# Patient Record
Sex: Female | Born: 1960 | Race: Black or African American | Hispanic: No | Marital: Single | Smoking: Never smoker
Health system: Southern US, Community
[De-identification: ages and names within clinical notes are randomized; demographics above are authoritative.]

## PROBLEM LIST (undated history)

## (undated) DIAGNOSIS — R079 Chest pain, unspecified: Secondary | ICD-10-CM

## (undated) DIAGNOSIS — R51 Headache: Secondary | ICD-10-CM

## (undated) DIAGNOSIS — E785 Hyperlipidemia, unspecified: Secondary | ICD-10-CM

## (undated) DIAGNOSIS — R112 Nausea with vomiting, unspecified: Secondary | ICD-10-CM

## (undated) DIAGNOSIS — I10 Essential (primary) hypertension: Secondary | ICD-10-CM

## (undated) DIAGNOSIS — K219 Gastro-esophageal reflux disease without esophagitis: Secondary | ICD-10-CM

## (undated) DIAGNOSIS — M797 Fibromyalgia: Secondary | ICD-10-CM

## (undated) DIAGNOSIS — F419 Anxiety disorder, unspecified: Secondary | ICD-10-CM

## (undated) DIAGNOSIS — R05 Cough: Secondary | ICD-10-CM

## (undated) DIAGNOSIS — Z9889 Other specified postprocedural states: Secondary | ICD-10-CM

## (undated) DIAGNOSIS — F32A Depression, unspecified: Secondary | ICD-10-CM

## (undated) DIAGNOSIS — R059 Cough, unspecified: Secondary | ICD-10-CM

## (undated) DIAGNOSIS — F329 Major depressive disorder, single episode, unspecified: Secondary | ICD-10-CM

## (undated) DIAGNOSIS — Z8679 Personal history of other diseases of the circulatory system: Secondary | ICD-10-CM

## (undated) HISTORY — DX: Chest pain, unspecified: R07.9

## (undated) HISTORY — DX: Personal history of other diseases of the circulatory system: Z86.79

## (undated) HISTORY — DX: Cough: R05

## (undated) HISTORY — DX: Hyperlipidemia, unspecified: E78.5

## (undated) HISTORY — PX: BLADDER SUSPENSION: SHX72

## (undated) HISTORY — PX: HEMATOMA EVACUATION: SHX5118

## (undated) HISTORY — DX: Essential (primary) hypertension: I10

## (undated) HISTORY — PX: UTERINE SUSPENSION: SUR1430

## (undated) HISTORY — PX: OTHER SURGICAL HISTORY: SHX169

## (undated) HISTORY — DX: Headache: R51

## (undated) HISTORY — DX: Cough, unspecified: R05.9

---

## 1997-09-10 ENCOUNTER — Other Ambulatory Visit: Admission: RE | Admit: 1997-09-10 | Discharge: 1997-09-10 | Payer: Self-pay | Admitting: Obstetrics and Gynecology

## 1997-11-26 ENCOUNTER — Other Ambulatory Visit: Admission: RE | Admit: 1997-11-26 | Discharge: 1997-11-26 | Payer: Self-pay | Admitting: Obstetrics and Gynecology

## 1997-12-11 ENCOUNTER — Ambulatory Visit (HOSPITAL_COMMUNITY): Admission: RE | Admit: 1997-12-11 | Discharge: 1997-12-11 | Payer: Self-pay | Admitting: Obstetrics & Gynecology

## 1998-03-17 ENCOUNTER — Emergency Department (HOSPITAL_COMMUNITY): Admission: EM | Admit: 1998-03-17 | Discharge: 1998-03-17 | Payer: Self-pay | Admitting: Emergency Medicine

## 1998-04-12 ENCOUNTER — Inpatient Hospital Stay (HOSPITAL_COMMUNITY): Admission: AD | Admit: 1998-04-12 | Discharge: 1998-04-17 | Payer: Self-pay | Admitting: Obstetrics and Gynecology

## 1998-12-13 ENCOUNTER — Ambulatory Visit (HOSPITAL_COMMUNITY): Admission: RE | Admit: 1998-12-13 | Discharge: 1998-12-13 | Payer: Self-pay | Admitting: Urology

## 1999-05-09 ENCOUNTER — Inpatient Hospital Stay (HOSPITAL_COMMUNITY): Admission: RE | Admit: 1999-05-09 | Discharge: 1999-05-11 | Payer: Self-pay | Admitting: Urology

## 1999-10-04 ENCOUNTER — Other Ambulatory Visit: Admission: RE | Admit: 1999-10-04 | Discharge: 1999-10-04 | Payer: Self-pay | Admitting: Obstetrics and Gynecology

## 2000-04-19 ENCOUNTER — Emergency Department (HOSPITAL_COMMUNITY): Admission: EM | Admit: 2000-04-19 | Discharge: 2000-04-19 | Payer: Self-pay | Admitting: Emergency Medicine

## 2000-09-14 ENCOUNTER — Other Ambulatory Visit: Admission: RE | Admit: 2000-09-14 | Discharge: 2000-09-14 | Payer: Self-pay | Admitting: Obstetrics and Gynecology

## 2001-09-16 ENCOUNTER — Other Ambulatory Visit: Admission: RE | Admit: 2001-09-16 | Discharge: 2001-09-16 | Payer: Self-pay | Admitting: Obstetrics and Gynecology

## 2001-10-01 ENCOUNTER — Other Ambulatory Visit: Admission: RE | Admit: 2001-10-01 | Discharge: 2001-10-01 | Payer: Self-pay | Admitting: Obstetrics and Gynecology

## 2002-04-10 ENCOUNTER — Other Ambulatory Visit: Admission: RE | Admit: 2002-04-10 | Discharge: 2002-04-10 | Payer: Self-pay | Admitting: Obstetrics and Gynecology

## 2002-05-17 ENCOUNTER — Inpatient Hospital Stay (HOSPITAL_COMMUNITY): Admission: AD | Admit: 2002-05-17 | Discharge: 2002-05-19 | Payer: Self-pay | Admitting: Obstetrics and Gynecology

## 2002-05-23 ENCOUNTER — Inpatient Hospital Stay (HOSPITAL_COMMUNITY): Admission: RE | Admit: 2002-05-23 | Discharge: 2002-05-25 | Payer: Self-pay | Admitting: Obstetrics and Gynecology

## 2003-04-21 ENCOUNTER — Other Ambulatory Visit: Admission: RE | Admit: 2003-04-21 | Discharge: 2003-04-21 | Payer: Self-pay | Admitting: Obstetrics and Gynecology

## 2004-05-22 HISTORY — PX: CARDIAC CATHETERIZATION: SHX172

## 2004-07-18 ENCOUNTER — Other Ambulatory Visit: Admission: RE | Admit: 2004-07-18 | Discharge: 2004-07-18 | Payer: Self-pay | Admitting: Obstetrics and Gynecology

## 2004-07-27 ENCOUNTER — Ambulatory Visit: Payer: Self-pay | Admitting: Cardiology

## 2004-08-10 ENCOUNTER — Ambulatory Visit: Payer: Self-pay | Admitting: Cardiology

## 2004-08-15 ENCOUNTER — Inpatient Hospital Stay (HOSPITAL_BASED_OUTPATIENT_CLINIC_OR_DEPARTMENT_OTHER): Admission: RE | Admit: 2004-08-15 | Discharge: 2004-08-15 | Payer: Self-pay | Admitting: Cardiology

## 2004-08-15 ENCOUNTER — Ambulatory Visit: Payer: Self-pay | Admitting: Cardiology

## 2004-09-08 ENCOUNTER — Ambulatory Visit: Payer: Self-pay | Admitting: Cardiology

## 2005-08-03 ENCOUNTER — Other Ambulatory Visit: Admission: RE | Admit: 2005-08-03 | Discharge: 2005-08-03 | Payer: Self-pay | Admitting: Obstetrics and Gynecology

## 2005-08-23 ENCOUNTER — Ambulatory Visit: Payer: Self-pay | Admitting: Cardiology

## 2005-10-18 ENCOUNTER — Ambulatory Visit: Payer: Self-pay | Admitting: Cardiology

## 2005-12-15 ENCOUNTER — Ambulatory Visit: Payer: Self-pay | Admitting: Cardiology

## 2006-07-02 ENCOUNTER — Emergency Department (HOSPITAL_COMMUNITY): Admission: EM | Admit: 2006-07-02 | Discharge: 2006-07-02 | Payer: Self-pay | Admitting: Family Medicine

## 2006-08-06 ENCOUNTER — Ambulatory Visit: Payer: Self-pay | Admitting: Cardiology

## 2006-08-06 LAB — CONVERTED CEMR LAB
ALT: 10 units/L (ref 0–40)
Alkaline Phosphatase: 103 units/L (ref 39–117)
Bilirubin, Direct: 0.1 mg/dL (ref 0.0–0.3)
Total CHOL/HDL Ratio: 5
VLDL: 21 mg/dL (ref 0–40)

## 2006-08-23 ENCOUNTER — Ambulatory Visit: Payer: Self-pay | Admitting: Cardiology

## 2006-10-02 ENCOUNTER — Ambulatory Visit: Payer: Self-pay | Admitting: Cardiology

## 2006-10-02 LAB — CONVERTED CEMR LAB
AST: 23 units/L (ref 0–37)
Bilirubin, Direct: 0.1 mg/dL (ref 0.0–0.3)
HDL: 46.8 mg/dL (ref >39.0)
Triglycerides: 54 mg/dL (ref 0–149)
VLDL: 11 mg/dL (ref 0–40)

## 2007-08-22 ENCOUNTER — Ambulatory Visit: Payer: Self-pay | Admitting: Cardiology

## 2007-08-22 LAB — CONVERTED CEMR LAB
ALT: 11 units/L (ref 0–35)
AST: 18 units/L (ref 0–37)
Albumin: 3.8 g/dL (ref 3.5–5.2)
Alkaline Phosphatase: 100 units/L (ref 39–117)
BUN: 11 mg/dL (ref 6–23)
CO2: 31 meq/L (ref 19–32)
Chloride: 102 meq/L (ref 96–112)
Cholesterol: 191 mg/dL (ref 0–200)
GFR calc Af Amer: 86 mL/min
Glucose, Bld: 92 mg/dL (ref 70–99)
Potassium: 4.2 meq/L (ref 3.5–5.1)
Total Protein: 7.7 g/dL (ref 6.0–8.3)
VLDL: 13 mg/dL (ref 0–40)

## 2007-11-27 ENCOUNTER — Ambulatory Visit: Payer: Self-pay | Admitting: Cardiology

## 2007-11-27 LAB — CONVERTED CEMR LAB
ALT: 15 units/L (ref 0–35)
AST: 21 units/L (ref 0–37)
Alkaline Phosphatase: 103 units/L (ref 39–117)
Bilirubin, Direct: 0.1 mg/dL (ref 0.0–0.3)
Total Bilirubin: 0.8 mg/dL (ref 0.3–1.2)

## 2008-03-10 ENCOUNTER — Ambulatory Visit: Payer: Self-pay | Admitting: Family Medicine

## 2008-04-10 ENCOUNTER — Ambulatory Visit: Payer: Self-pay | Admitting: Family Medicine

## 2008-09-08 ENCOUNTER — Ambulatory Visit: Payer: Self-pay | Admitting: Family Medicine

## 2008-11-02 DIAGNOSIS — E785 Hyperlipidemia, unspecified: Secondary | ICD-10-CM | POA: Insufficient documentation

## 2008-11-02 DIAGNOSIS — R079 Chest pain, unspecified: Secondary | ICD-10-CM | POA: Insufficient documentation

## 2008-11-02 DIAGNOSIS — Z8679 Personal history of other diseases of the circulatory system: Secondary | ICD-10-CM | POA: Insufficient documentation

## 2008-11-02 DIAGNOSIS — I10 Essential (primary) hypertension: Secondary | ICD-10-CM | POA: Insufficient documentation

## 2008-11-05 ENCOUNTER — Ambulatory Visit: Payer: Self-pay | Admitting: Cardiology

## 2008-11-05 DIAGNOSIS — R059 Cough, unspecified: Secondary | ICD-10-CM | POA: Insufficient documentation

## 2008-11-05 DIAGNOSIS — R05 Cough: Secondary | ICD-10-CM

## 2008-11-20 ENCOUNTER — Ambulatory Visit: Payer: Self-pay | Admitting: Pulmonary Disease

## 2008-11-20 DIAGNOSIS — R519 Headache, unspecified: Secondary | ICD-10-CM | POA: Insufficient documentation

## 2008-11-20 DIAGNOSIS — R51 Headache: Secondary | ICD-10-CM

## 2008-11-25 ENCOUNTER — Telehealth: Payer: Self-pay | Admitting: Pulmonary Disease

## 2008-12-17 ENCOUNTER — Ambulatory Visit: Payer: Self-pay | Admitting: Cardiology

## 2008-12-22 LAB — CONVERTED CEMR LAB
Albumin: 3.8 g/dL (ref 3.5–5.2)
BUN: 13 mg/dL (ref 6–23)
CO2: 30 meq/L (ref 19–32)
Calcium: 9.3 mg/dL (ref 8.4–10.5)
Cholesterol: 231 mg/dL — ABNORMAL HIGH (ref 0–200)
GFR calc non Af Amer: 85.68 mL/min (ref >60)
Glucose, Bld: 94 mg/dL (ref 70–99)
HDL: 47.9 mg/dL (ref >39.00)
Total Protein: 7.7 g/dL (ref 6.0–8.3)
VLDL: 15.2 mg/dL (ref 0.0–40.0)

## 2008-12-30 ENCOUNTER — Telehealth: Payer: Self-pay | Admitting: Cardiology

## 2009-10-21 ENCOUNTER — Ambulatory Visit: Payer: Self-pay | Admitting: Cardiology

## 2009-10-29 ENCOUNTER — Ambulatory Visit: Payer: Self-pay | Admitting: Family Medicine

## 2009-12-13 ENCOUNTER — Ambulatory Visit: Payer: Self-pay | Admitting: Cardiology

## 2009-12-21 ENCOUNTER — Encounter: Payer: Self-pay | Admitting: Cardiology

## 2009-12-21 LAB — CONVERTED CEMR LAB
Albumin: 4.1 g/dL (ref 3.5–5.2)
Alkaline Phosphatase: 89 units/L (ref 39–117)
Bilirubin, Direct: 0.1 mg/dL (ref 0.0–0.3)
LDL Cholesterol: 74 mg/dL (ref 0–99)
Total CHOL/HDL Ratio: 3
Total Protein: 7.4 g/dL (ref 6.0–8.3)
Triglycerides: 49 mg/dL (ref 0.0–149.0)
VLDL: 9.8 mg/dL (ref 0.0–40.0)

## 2010-04-20 ENCOUNTER — Ambulatory Visit: Payer: Self-pay | Admitting: Family Medicine

## 2010-05-05 ENCOUNTER — Ambulatory Visit: Payer: Self-pay | Admitting: Family Medicine

## 2010-06-21 ENCOUNTER — Other Ambulatory Visit (HOSPITAL_COMMUNITY): Payer: Self-pay | Admitting: Obstetrics and Gynecology

## 2010-06-21 ENCOUNTER — Ambulatory Visit (HOSPITAL_COMMUNITY)
Admission: RE | Admit: 2010-06-21 | Discharge: 2010-06-21 | Payer: Self-pay | Source: Home / Self Care | Attending: Orthopedic Surgery | Admitting: Orthopedic Surgery

## 2010-06-21 ENCOUNTER — Emergency Department (HOSPITAL_COMMUNITY)
Admission: EM | Admit: 2010-06-21 | Discharge: 2010-06-21 | Payer: Self-pay | Source: Home / Self Care | Admitting: Family Medicine

## 2010-06-21 DIAGNOSIS — G8929 Other chronic pain: Secondary | ICD-10-CM

## 2010-06-21 NOTE — Letter (Signed)
Summary: Custom - Lipid  [facility], Main Office  [address]   [place], [place] [address]   Phone: [phone]  Fax: [phone]     December 21, 2009 MRN: [id number]   [facility] [address] [place], [place]  [address]   Dear Gina Espinoza,  We have reviewed your cholesterol results.  They are as follows:     Total Cholesterol:    130 (Desirable: less than 200)       HDL  Cholesterol:     46.20  (Desirable: greater than 40 for men and 50 for women)       LDL Cholesterol:       74  (Desirable: less than 100 for low risk and less than 70 for moderate to high risk)       Triglycerides:       49.0  (Desirable: less than 150)  Our recommendations include:EXCELLANT NO CHANGES .   Call our office at the number listed above if you have any questions.  Lowering your LDL cholesterol is important, but it is only one of a large number of "risk factors" that may indicate that you are at risk for heart disease, stroke or other complications of hardening of the arteries.  Other risk factors include:   A.  Cigarette Smoking* B.  High Blood Pressure* C.  Obesity* D.   Low HDL Cholesterol (see yours above)* E.   Diabetes Mellitus (higher risk if your is uncontrolled) F.  Family history of premature heart disease G.  Previous history of stroke or cardiovascular disease    *These are risk factors YOU HAVE CONTROL OVER.  For more information, visit .  There is now evidence that lowering the TOTAL CHOLESTEROL AND LDL CHOLESTEROL can reduce the risk of heart disease.  The American Heart Association recommends the following guidelines for the treatment of elevated cholesterol:  1.  If there is now current heart disease and less than two risk factors, TOTAL CHOLESTEROL should be less than 200 and LDL CHOLESTEROL should be less than 100. 2.  If there is current heart disease or two or more risk factors, TOTAL CHOLESTEROL should be less than 200 and LDL CHOLESTEROL should be  less than 70.  A diet low in cholesterol, saturated fat, and calories is the cornerstone of treatment for elevated cholesterol.  Cessation of smoking and exercise are also important in the management of elevated cholesterol and preventing vascular disease.  Studies have shown that 30 to 60 minutes of physical activity most days can help lower blood pressure, lower cholesterol, and keep your weight at a healthy level.  Drug therapy is used when cholesterol levels do not respond to therapeutic lifestyle changes (smoking cessation, diet, and exercise) and remains unacceptably high.  If medication is started, it is important to have you levels checked periodically to evaluate the need for further treatment options.  Thank you,    [employer] Team
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
## 2010-07-04 ENCOUNTER — Ambulatory Visit (HOSPITAL_COMMUNITY)
Admission: RE | Admit: 2010-07-04 | Discharge: 2010-07-04 | Disposition: A | Discharge status: Home / Self Care | Payer: Self-pay | Source: Ambulatory Visit | Attending: Obstetrics and Gynecology | Admitting: Obstetrics and Gynecology

## 2010-07-04 DIAGNOSIS — M19019 Primary osteoarthritis, unspecified shoulder: Secondary | ICD-10-CM | POA: Insufficient documentation

## 2010-07-04 DIAGNOSIS — G8929 Other chronic pain: Secondary | ICD-10-CM

## 2010-07-04 DIAGNOSIS — M719 Bursopathy, unspecified: Secondary | ICD-10-CM | POA: Insufficient documentation

## 2010-07-04 DIAGNOSIS — M67919 Unspecified disorder of synovium and tendon, unspecified shoulder: Secondary | ICD-10-CM | POA: Insufficient documentation

## 2010-07-04 DIAGNOSIS — M25519 Pain in unspecified shoulder: Secondary | ICD-10-CM | POA: Insufficient documentation

## 2010-08-29 ENCOUNTER — Inpatient Hospital Stay (INDEPENDENT_AMBULATORY_CARE_PROVIDER_SITE_OTHER)
Admission: RE | Admit: 2010-08-29 | Discharge: 2010-08-29 | Disposition: A | Discharge status: Home / Self Care | Payer: Self-pay | Source: Ambulatory Visit | Attending: Family Medicine | Admitting: Family Medicine

## 2010-08-29 DIAGNOSIS — H109 Unspecified conjunctivitis: Secondary | ICD-10-CM

## 2010-08-31 ENCOUNTER — Ambulatory Visit: Payer: Self-pay | Attending: Rheumatology

## 2010-08-31 DIAGNOSIS — M25569 Pain in unspecified knee: Secondary | ICD-10-CM | POA: Insufficient documentation

## 2010-08-31 DIAGNOSIS — M542 Cervicalgia: Secondary | ICD-10-CM | POA: Insufficient documentation

## 2010-08-31 DIAGNOSIS — R5381 Other malaise: Secondary | ICD-10-CM | POA: Insufficient documentation

## 2010-08-31 DIAGNOSIS — M2560 Stiffness of unspecified joint, not elsewhere classified: Secondary | ICD-10-CM | POA: Insufficient documentation

## 2010-08-31 DIAGNOSIS — IMO0001 Reserved for inherently not codable concepts without codable children: Secondary | ICD-10-CM | POA: Insufficient documentation

## 2010-09-07 ENCOUNTER — Ambulatory Visit: Payer: Self-pay

## 2010-09-13 ENCOUNTER — Ambulatory Visit: Payer: Self-pay

## 2010-09-14 ENCOUNTER — Ambulatory Visit: Payer: Self-pay

## 2010-09-19 ENCOUNTER — Ambulatory Visit: Payer: Self-pay

## 2010-09-21 ENCOUNTER — Ambulatory Visit: Payer: Self-pay | Attending: Rheumatology

## 2010-09-21 DIAGNOSIS — M25569 Pain in unspecified knee: Secondary | ICD-10-CM | POA: Insufficient documentation

## 2010-09-21 DIAGNOSIS — M542 Cervicalgia: Secondary | ICD-10-CM | POA: Insufficient documentation

## 2010-09-21 DIAGNOSIS — M2560 Stiffness of unspecified joint, not elsewhere classified: Secondary | ICD-10-CM | POA: Insufficient documentation

## 2010-09-21 DIAGNOSIS — IMO0001 Reserved for inherently not codable concepts without codable children: Secondary | ICD-10-CM | POA: Insufficient documentation

## 2010-09-21 DIAGNOSIS — R5381 Other malaise: Secondary | ICD-10-CM | POA: Insufficient documentation

## 2010-09-23 ENCOUNTER — Telehealth: Payer: Self-pay | Admitting: Cardiology

## 2010-09-23 NOTE — Telephone Encounter (Signed)
LOV,12 faxed to Dr.John C Lalonde's Office @ [phone] 09/23/10/km

## 2010-09-26 ENCOUNTER — Ambulatory Visit: Payer: Self-pay

## 2010-09-26 ENCOUNTER — Telehealth: Payer: Self-pay | Admitting: Cardiology

## 2010-09-26 NOTE — Telephone Encounter (Signed)
Pt Signed ROI, Mailed Records  To Home Address 09/26/10/km

## 2010-09-28 ENCOUNTER — Ambulatory Visit: Payer: Self-pay

## 2010-10-03 ENCOUNTER — Ambulatory Visit: Payer: Self-pay

## 2010-10-04 NOTE — Assessment & Plan Note (Signed)
[facility]                            CARDIOLOGY OFFICE NOTE   NAME:Espinoza, Gina APODACA                       MRN:          [id number]  DATE:08/22/2007                            DOB:          12-29-1960    Gina Espinoza returns today for further management of the following issues.  1. History of chest pain, typical for coronary spasm.  2. Heart catheterization with no obstructive coronary disease.  3. Normal left ventricular function.  4. Mixed hyperlipidemia.  5. Hypertension.   Other than some shoulder pain from over vacuuming and pulling a muscle,  she is doing well.  She denies any angina or anginal equivalents. She  has not had to use any nitroglycerin.   MEDICATIONS:  1. Norvasc 2.5 mg a day.  2. Simvastatin 40 mg a day.  3. Nitroglycerin p.r.n.   Her blood pressure today is 115/70, her pulse is 54 and regular.  Her  weight is 169.  HEENT:  Normocephalic, atraumatic.  PERRLA.  Extraocular movement is  intact.  Sclerae are clear.  Facial symmetry normal.  Carotid upstrokes  are equal bilaterally without bruits. No JVD.  Thyroid is not enlarged.  Trachea is midline.  LUNGS:  Clear.  HEART:  Reveals a nondisplaced PMI.  Normal S1 and S2 without gallop.  ABDOMEN:  Soft with good bowel sounds.  EXTREMITIES:  No cyanosis, clubbing or edema. Pulses are intact.  NEUROLOGIC:  Intact.   EKG:  Normal except for some marked sinus bradycardia.   ASSESSMENT/PLAN:  Gina Espinoza is doing well.  She is totally asymptomatic.  Blood pressure is under good control. She is due lipids and LFTs today.  We will see her back again in a year.     Thomas C. Daleen Squibb, MD, [facility]  Electronically Signed    TCW/MedQ  DD: 08/22/2007  DT: 08/22/2007  Job #: [id number]   cc:   Marcelino Duster L. Vincente Poli, M.D.

## 2010-10-05 ENCOUNTER — Telehealth: Payer: Self-pay | Admitting: Cardiology

## 2010-10-05 NOTE — Telephone Encounter (Signed)
Patient came to office today wondering if she correctly completed the ROI on 09/23/10, because she didn't receive all of the records she was expecting. After authorizing with Luster Landsberg, I printed out a copy of the ROI and put it on the courier for [facility] to complete. Patient was informed it would be forwarded to [facility] for completion and could take 7 to 10 business days.

## 2010-10-07 NOTE — Op Note (Signed)
   NAMEBAILEY, FAIELLA                          ACCOUNT NO.:  [id number]   MEDICAL RECORD NO.:  [id number]                   PATIENT TYPE:  OBV   LOCATION:  9199                                 [facility]ILITY:  [facility]   PHYSICIAN:  Michelle L. Vincente Poli, M.D.            DATE OF BIRTH:  Sep 19, 1960   DATE OF PROCEDURE:  05/16/2002  DATE OF DISCHARGE:                                 OPERATIVE REPORT   PREOPERATIVE DIAGNOSIS:  Symptomatic rectocele.   POSTOPERATIVE DIAGNOSIS:  Symptomatic rectocele.   PROCEDURE:  Posterior repair, anterior perineoplasty.   SURGEON:  Michelle L. Vincente Poli, M.D.   ANESTHESIA:  General.   ESTIMATED BLOOD LOSS:  50 cc.   DESCRIPTION OF PROCEDURE:  The patient was taken to the operating room.  She  was then intubated without difficulty and placed in the lithotomy position.  Exam under anesthesia revealed a grade 2 cystocele, grade 2-3 rectocele and  grade 2 uterine prolapse.  Of note, the patient had been preoperatively  counseled in the office that she would need an anterior repair and a vaginal  hysterectomy but she only wanted to have the posterior repair.  Allis clamps  were placed at 5 and 7 o'clock.  A V-shaped incision was made on the  peroneum and the overlying vaginal epithelium was excised up to the cervix.  The rectovaginal fascia was dissected free and the rectovaginal fascia was  then reapproximated in the midline with interrupted 0 Vicryl.  This was done  distally to proximally.  The redundant vaginal tissue was then excised.  The  perineum was reinforced using 0 Vicryl figure-of-eight suture and the  vaginal epithelium and the perineum were closed using 2-0 Vicryl in a  continuous running locked stitch.  Vaginal packing was placed in the vagina  for hemostasis.  All sponge, lap and instrument counts were correct x 2.  The patient tolerated the procedure well and went to the recovery room in  stable condition.           Michelle L. Vincente Poli, M.D.    [name]  D:  05/16/2002  T:  05/16/2002  Job:  [id number]

## 2010-10-07 NOTE — H&P (Signed)
   Gina Espinoza, Gina Espinoza                          ACCOUNT NO.:  [id number]   MEDICAL RECORD NO.:  [id number]                   PATIENT TYPE:  INP   LOCATION:  9142                                 [facility]ILITY[facility]   PHYSICIAN:  Michelle L. Vincente Poli, M.D.            DATE OF BIRTH:  08/17/1960   DATE OF ADMISSION:  05/25/2002  DATE OF DISCHARGE:                                HISTORY & PHYSICAL   HISTORY:  This patient is a 50 year old female who had an anterior and  posterior repair on May 19, 2002.  She reports that she fell, going  down some stairs today, and has noted the onset of increased vaginal pain as  well as vaginal bleeding.  In the office, she was noted to have an  approximately 3-cm hematoma, and patient was taken to the operating room for  evacuation of the hematoma.   ALLERGIES:  She is allergic to CODEINE and IV DYE.   MEDICATIONS:  1. Include Demerol.  2. Ibuprofen.   PHYSICAL EXAMINATION:  VITAL SIGNS:  On admission, patient is afebrile with  stable vital signs.  Her hemoglobin is 11.5, and white blood cell count is  5.4, and platelet count is 339.  LUNGS:  On exam, lungs are clear to auscultation bilaterally.  CARDIAC:  Regular rate and rhythm.  BREASTS:  Soft, nontender, no masses.  PELVIC EXAM:  External genitalia within normal limits.  Vagina, there is a 3-  cm, palpable mass in the posterior vagina, which is consistent with a  hematoma.   IMPRESSION:  Vaginal hematoma.   PLAN:  I&D of hematoma in the operating room.  The risks and benefits were  discussed with the patient, and we will proceed with surgery.                                               Michelle L. Vincente Poli, M.D.    [name]  D:  10/09/2002  T:  10/09/2002  Job:  [id number]

## 2010-10-07 NOTE — Discharge Summary (Signed)
   [name], Gina Espinoza                          ACCOUNT NO.:  [id number]   MEDICAL RECORD NO.:  [id number]                   PATIENT TYPE:  INP   LOCATION:  9142                                 [facility]   PHYSICIAN:  Michelle L. Vincente Poli, M.D.            DATE OF BIRTH:  [birth year]   DATE OF ADMISSION:  05/23/2002  DATE OF DISCHARGE:  05/25/2002                                 DISCHARGE SUMMARY   ADMISSION DIAGNOSIS:  Vaginal hematoma postoperatively.   DISCHARGE DIAGNOSIS:  Vaginal hematoma postoperatively.   PROCEDURE:  Evacuation of hematoma.   HOSPITAL COURSE:  The patient was admitted from the office as she had  developed a hematoma after falling status post anterior and posterior  repair.  On the day of admission she underwent evacuation of a hematoma of  the vagina.  The patient did very well.  She did have a small temperature  after surgery, was placed on antibiotics, and by postoperative day #2 was  doing much better.  Her hemoglobin was 9.6 and white blood count was 3.2.  She was discharged home on postoperative day #2 in good condition.   DISPOSITION:  1. She was discharged home with Augmentin to take for one week as well as     Tylox to take as needed for pain.  2. She will follow up in the office in one week.  3. She was advised to call if she had any temperature greater than 100.5,     nausea, vomiting, or vaginal bleeding.                                               Michelle L. Vincente Poli, M.D.    [name]  D:  10/09/2002  T:  10/09/2002  Job:  [id number]

## 2010-10-07 NOTE — Cardiovascular Report (Signed)
Gina Espinoza, HOSEIN                ACCOUNT NO.:  [id number]   MEDICAL RECORD NO.:  [id number]          PATIENT TYPE:  OIB   LOCATION:  6501                         FACILITY:  [facility]   PHYSICIAN:  Charlies Constable, M.D. [facility] DATE OF BIRTH:  16-Oct-1960   DATE OF PROCEDURE:  08/15/2004  DATE OF DISCHARGE:                              CARDIAC CATHETERIZATION   CLINICAL HISTORY:  Mrs. Brum is 50 years old and works as school Midwife  and also referees basketball.  She has a 69 year old  daughter who is a star  point guard for [employer].  She has been having chest pain over recent weeks  and this required a trip to the emergency room.  She had a treadmill test  done by Dr. Daleen Squibb which showed inferolateral ST depression.  For this reason  she was brought in for evaluation with angiography.  She also has history of  hypertension.   DESCRIPTION OF PROCEDURE:  The procedure was performed via the right femoral  artery using an arterial sheath and a and 4-French preformed coronary  catheters.  She tolerated the procedure well and left the laboratory in  satisfactory condition.   RESULTS:  Left main coronary arteriogram:  The left main coronary artery was  free of disease.  Left anterior descending artery:  The left anterior descending artery gives  rise to two diagonal branches and three septal perforators.  These  __________ and were free of disease.  The circumflex artery:  The circumflex artery gave rise to a small marginal  branch, a large marginal branch, and a posterolateral branch.  These vessels  were free of significant disease.  Right coronary artery:  The right coronary artery is a moderate vessel and  gave rise to two right ventricular branches, a posterior descending and two  posterolateral branches.  These vessels are free of significant disease.   LEFT VENTRICULOGRAM:  The left ventriculogram was performed in the RAO  projection showed good wall motion with no areas of hypokinesis.   The  estimated ejection fraction was 60%.   The RA pressure was 122/70, mean of 92 and left mid pressure was 122/10.   CONCLUSION:  Normal coronary artery and normal left ventricular wall motion.   RECOMMENDATIONS:  Reassurance.  The patient has no evidence of obstructive  coronary disease.  With her abnormal stress ECG, it is possible that she  could have microvascular angina.  I will review the history with her and  with Dr. Daleen Squibb to see if her symptoms are exertional and will plan to arrange  followup with Dr. Daleen Squibb and let him decide about further management.      BB/MEDQ  D:  08/15/2004  T:  08/15/2004  Job:  [id number]   cc:   Marcelino Duster L. Vincente Poli, M.D.  [address]  [place]  [place] [address]  Fax: [phone]   Jesse Sans. Wall, M.D.

## 2010-10-07 NOTE — Discharge Summary (Signed)
   Gina Espinoza, Gina Espinoza                          ACCOUNT NO.:  [id number]   MEDICAL RECORD NO.:  [id number]                   PATIENT TYPE:  INP   LOCATION:  9106                                 [facility]   PHYSICIAN:  Michelle L. Vincente Poli, M.D.            DATE OF BIRTH:  [birth year]   DATE OF ADMISSION:  05/16/2002  DATE OF DISCHARGE:  05/19/2002                                 DISCHARGE SUMMARY   ADMISSION DIAGNOSIS:  Rectocele.   DISCHARGE DIAGNOSIS:  Rectocele.   HOSPITAL COURSE:  The patient is a 50 year old gravida 5 para 3 with  symptomatic rectocele.  On day of surgery she undergoes a posterior repair.  She did very well after surgery.  She did have some perineal pain.  Her  hemoglobin was 12.6 prior to surgery and on postoperative day #1 it was  10.7.  White blood cell count was 8.9.  The patient did have some pain  issues related to swelling of the perineal area and was discharged home in  good condition on postoperative day #3.  She was afebrile with stable vital  signs.   DISPOSITION:  1. She was discharged home with Demerol to take as needed for pain.  2. She was advised to follow up in the office in two weeks, no driving for     one week.  3. She was advised to call if she has any temperature greater than 100.5,     nausea, vomiting, or vaginal bleeding.                                               Michelle L. Vincente Poli, M.D.    [name]  D:  10/09/2002  T:  10/09/2002  Job:  [id number]

## 2010-10-07 NOTE — Op Note (Signed)
   NAMEGERALDENE, Gina Espinoza                          ACCOUNT NO.:  [id number]   MEDICAL RECORD NO.:  [id number]                   PATIENT TYPE:  AMB   LOCATION:  SDC                                  FACILITY:  [facility]   PHYSICIAN:  Michelle L. Vincente Poli, M.D.            DATE OF BIRTH:  1960/11/17   DATE OF PROCEDURE:  05/23/2002  DATE OF DISCHARGE:                                 OPERATIVE REPORT   PREOPERATIVE DIAGNOSIS:  Vaginal hematoma.   POSTOPERATIVE DIAGNOSIS:  Vaginal hematoma.   PROCEDURE:  Evacuation of hematoma.   SURGEON:  Michelle L. Vincente Poli, M.D.   ANESTHESIA:  Spinal.   ESTIMATED BLOOD LOSS:  Minimal.   INDICATIONS FOR PROCEDURE:  This is a 50 year old female who is status post  posterior repair approximately one week ago.  She had an uncomplicated  postop course.  However, yesterday she slipped on her stairs at home and  developed severe vaginal pain with some blood discharge.  On exam in the  office revealed a 2.5-cm hematoma in the right mid portion up behind the  suture line, which is extremely painful to the patient.  The patient is  counseled in regards to expectant management versus drainage of hematoma.  She would like to have drainage of the hematoma.   PROCEDURE:  The patient was taken to the operating room and spinal was  placed.  She was placed in the lithotomy position.  The vagina and vulva  were prepped and draped in the usual sterile fashion.  In-and-out catheter  was used to empty the bladder.  Using a speculum, the suture line was  inspected and noted to be intact.  There was a small amount of dark red  drainage from the upper portion of the suture line.  Using a hemostasis this  is probed and the dark, red, thick drainage was noted, consistent with a  draining hematoma.  The hematoma was drained completely and I then used a  series of figure-of-eights for hemostasis using 0 Vicryl suture.  Rectal  exam with a separate glove revealed the hematoma was  completely gone.  Vaginal packing was then placed and all sponge, lap, and instrument counts  were correct x2.  I will keep the patient overnight for observation and she  will be given p.o. Augmentin for prophylaxis.                                                  Michelle L. Vincente Poli, M.D.    [name]  D:  05/23/2002  T:  05/23/2002  Job:  [id number]

## 2010-10-07 NOTE — Assessment & Plan Note (Signed)
[facility]                            CARDIOLOGY OFFICE NOTE   NAME:Espinoza, Gina EICKHOFF                       MRN:          [id number]  DATE:08/23/2006                            DOB:          21-Aug-1960    Gina Espinoza returns today for further management of chest pain, question  of coronary spasm, hyperlipidemia.   She stopped taking her simvastatin and her lipids went straight back up  to 224 total, the LDL to 153.  She had a really good response to  simvastatin.  She would like to start it again.   She takes Norvasc 2.5 daily for her blood pressure and for question of  coronary spasm.  She has been having a lot of chest pain lately.  It  usually comes on at rest, described as a pinching sensation.  I do not  think this is cardiac.  She does take nitroglycerin on occasion which  seems to maybe help.  She carries nitroglycerin most of the time.   She feels better when she exercises.  She is quite athletic.   Her exam today her blood pressure is 127/81, her pulse is 54 and  regular.  Weight is 167, up from 159.  HEENT:  Normocephalic, atraumatic.  PERRLA, extraocular movements  intact, sclerae clear.  NECK:  Is supple.  Carotid upstrokes are equal bilaterally without  bruits.  No JVD.  Thyroid is not enlarged.  Trachea is midline.  HEART:  Reveals a regular rate and rhythm without murmur, rub or gallop.  ABDOMEN:  Exam is soft with good bowel sounds.  No midline bruits.  EXTREMITIES:  Reveal no cyanosis, clubbing or edema.  Pulses are intact.  NEURO:  Exam is intact.   EKG is essentially normal.  She does have significant bradycardia at 47.   ASSESSMENT/PLAN:  I tried to reassure Gina Espinoza that her chest pain is  probably not coronary spasm.  I have told her however, if nitroglycerin  helps to sit down, lie down, and take no more than 3.  I renewed that  prescription today.  We also got her back on simvastatin 40 mg daily.  She had an excellent  response to this before.  We renewed also her  amlodipine at 2.5 mg daily.   I will plan on seeing her back again in 6 months.     Thomas C. Daleen Squibb, MD, [facility]  Electronically Signed    TCW/MedQ  DD: 08/23/2006  DT: 08/23/2006  Job #: [id number]   cc:   Gina Espinoza, M.D.

## 2010-10-25 ENCOUNTER — Encounter: Payer: Self-pay | Admitting: Cardiology

## 2010-11-17 ENCOUNTER — Encounter: Payer: Self-pay | Admitting: Cardiology

## 2010-11-17 ENCOUNTER — Ambulatory Visit (INDEPENDENT_AMBULATORY_CARE_PROVIDER_SITE_OTHER): Payer: Self-pay | Admitting: Cardiology

## 2010-11-17 VITALS — BP 100/66 | HR 46 | Ht 66.0 in | Wt 146.4 lb

## 2010-11-17 DIAGNOSIS — E785 Hyperlipidemia, unspecified: Secondary | ICD-10-CM

## 2010-11-17 DIAGNOSIS — I10 Essential (primary) hypertension: Secondary | ICD-10-CM

## 2010-11-17 DIAGNOSIS — Z8679 Personal history of other diseases of the circulatory system: Secondary | ICD-10-CM

## 2010-11-17 DIAGNOSIS — R079 Chest pain, unspecified: Secondary | ICD-10-CM

## 2010-11-17 LAB — LIPID PANEL
HDL: 49.6 mg/dL (ref >39.00)
Total CHOL/HDL Ratio: 3
VLDL: 8 mg/dL (ref 0.0–40.0)

## 2010-11-17 LAB — HEPATIC FUNCTION PANEL: Total Bilirubin: 0.7 mg/dL (ref 0.3–1.2)

## 2010-11-17 MED ORDER — NITROGLYCERIN 0.4 MG SL SUBL: 0.4000 mg | SUBLINGUAL_TABLET | SUBLINGUAL | Status: DC | PRN

## 2010-11-17 MED ORDER — ROSUVASTATIN CALCIUM 5 MG PO TABS: 5.0000 mg | ORAL_TABLET | Freq: Every day | ORAL | Status: DC

## 2010-11-17 MED ORDER — AMLODIPINE BESYLATE 2.5 MG PO TABS: 2.5000 mg | ORAL_TABLET | Freq: Every day | ORAL | Status: DC

## 2010-11-17 NOTE — Assessment & Plan Note (Signed)
Stable. Continue to use nitroglycerin p.r.n. Prescription renewed.

## 2010-11-17 NOTE — Assessment & Plan Note (Signed)
Improved. Continue medical therapy for her

## 2010-11-17 NOTE — Patient Instructions (Signed)
Your physician recommends that you have lab work today for cholesterol level and liver  Your physician recommends that you schedule a follow-up appointment in: 1 year with Dr. Daleen Squibb

## 2010-11-17 NOTE — Progress Notes (Signed)
HPI Gina Espinoza returns for evaluation management of her possible coronary spasm and angina. We also follow her hyperlipidemia.  She has multiple complaints today mostly related to stress. She is not sleeping well. He said a few episodes of angina about one month which responds to nitroglycerin. On one occasion she had to take 3. She seems to be compliant with her medications however she just ran out of her amlodipine. She needs her nor nitroglycerin. She is fasting and would like to have her blood work checked today.  EKG today shows sinus bradycardia in the 40s with no ST segment changes. Past Medical History  Diagnosis Date  . Personal history of unspecified circulatory disease   . Chest pain, unspecified   . Unspecified essential hypertension   . Other and unspecified hyperlipidemia     mixed  . Headache   . Cough     Past Surgical History  Procedure Date  . Hematoma evacuation     vaginal hematoma 05/23/02  . Anterior perineoplasty     posterior repair. 05/16/02    Family History  Problem Relation Age of Onset  . Allergies Mother     also children  . Asthma Daughter     and granddaughter  . Heart disease      father  . Cancer Maternal Grandmother     breast cancer  . Cancer Brother     colon cancer    History   Social History  . Marital Status: Single    Spouse Name: N/A    Number of Children: N/A  . Years of Education: N/A   Occupational History  . Not on file.   Social History Main Topics  . Smoking status: Never Smoker   . Smokeless tobacco: Not on file  . Alcohol Use: No  . Drug Use: No  . Sexually Active: Not on file   Other Topics Concern  . Not on file   Social History Narrative   Single, children, works as a Midwife.     Allergies  Allergen Reactions  . Codeine     Current Outpatient Prescriptions  Medication Sig Dispense Refill  . ALPRAZolam (XANAX) 0.5 MG tablet Take 0.5 mg by mouth daily.        Marland Kitchen amLODipine (NORVASC) 2.5 MG  tablet Take 1 tablet (2.5 mg total) by mouth daily.  30 tablet  11  . diphenhydrAMINE (BENADRYL) 25 mg capsule Take 25 mg by mouth every 6 (six) hours as needed.        . ferrous sulfate 325 (65 FE) MG tablet Take 325 mg by mouth daily with breakfast.        . ketorolac (TORADOL) 10 MG tablet Take 20 mg by mouth every 6 (six) hours as needed.        . lidocaine (LIDODERM) 5 % Place 1 patch onto the skin daily. Remove & Discard patch within 12 hours or as directed by MD//3 patches at a time, 12 hours on and 12 hours off       . loratadine (CLARITIN) 10 MG tablet Take 10 mg by mouth as needed.        . nitroGLYCERIN (NITROSTAT) 0.4 MG SL tablet Place 1 tablet (0.4 mg total) under the tongue every 5 (five) minutes as needed.  90 tablet  3  . rosuvastatin (CRESTOR) 5 MG tablet Take 1 tablet (5 mg total) by mouth daily.  30 tablet  11  . topiramate (TOPAMAX) 100 MG tablet Take 100 mg  by mouth daily.        . traMADol (ULTRAM) 50 MG tablet Take 50 mg by mouth every 6 (six) hours as needed.        . valACYclovir (VALTREX) 500 MG tablet Take 500 mg by mouth as needed.        . zolpidem (AMBIEN) 10 MG tablet Take 10 mg by mouth at bedtime as needed.        Marland Kitchen DISCONTD: amLODipine (NORVASC) 2.5 MG tablet Take 2.5 mg by mouth daily.        Marland Kitchen DISCONTD: amLODipine (NORVASC) 2.5 MG tablet Take 1 tablet (2.5 mg total) by mouth daily.  30 tablet  11  . DISCONTD: nitroGLYCERIN (NITROSTAT) 0.4 MG SL tablet Place 0.4 mg under the tongue every 5 (five) minutes as needed.        Marland Kitchen DISCONTD: nitroGLYCERIN (NITROSTAT) 0.4 MG SL tablet Place 1 tablet (0.4 mg total) under the tongue every 5 (five) minutes as needed.  90 tablet  3  . DISCONTD: rosuvastatin (CRESTOR) 5 MG tablet Take 5 mg by mouth daily.        Marland Kitchen DISCONTD: rosuvastatin (CRESTOR) 5 MG tablet Take 1 tablet (5 mg total) by mouth daily.  30 tablet  11  . furosemide (LASIX) 20 MG tablet Take 20 mg by mouth as needed.          ROS Negative other than HPI.    PE General Appearance: well developed, well nourished in no acute distress HEENT: symmetrical face, PERRLA, good dentition  Neck: no JVD, thyromegaly, or adenopathy, trachea midline Chest: symmetric without deformity Cardiac: PMI non-displaced, RRR, normal S1, S2, no gallop or murmur Lung: clear to ausculation and percussion Vascular: all pulses full without bruits  Abdominal: nondistended, nontender, good bowel sounds, no HSM, no bruits Extremities: no cyanosis, clubbing or edema, no sign of DVT, no varicosities  Skin: normal color, no rashes Neuro: alert and oriented x 3, non-focal Pysch: normal affect Filed Vitals:   11/17/10 1229  BP: 100/66  Pulse: 46  Height: 5\' 6"  (1.676 m)  Weight: 146 lb 6.4 oz (66.407 kg)    EKG  Labs and Studies Reviewed.   No results found for this basename: WBC, HGB, HCT, MCV, PLT      Chemistry      Component Value Date/Time   NA 140 12/17/2008 0901   K 4.2 12/17/2008 0901   CL 103 12/17/2008 0901   CO2 30 12/17/2008 0901   BUN 13 12/17/2008 0901   CREATININE 0.9 12/17/2008 0901      Component Value Date/Time   CALCIUM 9.3 12/17/2008 0901   ALKPHOS 89 12/13/2009 1049   AST 19 12/13/2009 1049   ALT 13 12/13/2009 1049   BILITOT 0.6 12/13/2009 1049       Lab Results  Component Value Date   CHOL 130 12/13/2009   CHOL 231* 12/17/2008   CHOL 132 11/27/2007   Lab Results  Component Value Date   HDL 46.20 12/13/2009   HDL 16.10 12/17/2008   HDL 96.0 11/27/2007   Lab Results  Component Value Date   LDLCALC 74 12/13/2009   LDLCALC 78 11/27/2007   LDLCALC 131* 08/22/2007   Lab Results  Component Value Date   TRIG 49.0 12/13/2009   TRIG 76.0 12/17/2008   TRIG 42 11/27/2007   Lab Results  Component Value Date   CHOLHDL 3 12/13/2009   CHOLHDL 5 12/17/2008   CHOLHDL 2.9 CALC 11/27/2007   No results found for  this basename: HGBA1C   Lab Results  Component Value Date   ALT 13 12/13/2009   AST 19 12/13/2009   ALKPHOS 89 12/13/2009   BILITOT 0.6  12/13/2009   No results found for this basename: TSH

## 2010-11-17 NOTE — Assessment & Plan Note (Signed)
Check blood work today.

## 2010-11-18 ENCOUNTER — Encounter: Payer: Self-pay | Admitting: *Deleted

## 2010-12-01 ENCOUNTER — Other Ambulatory Visit (HOSPITAL_COMMUNITY): Payer: Self-pay | Admitting: Orthopedic Surgery

## 2010-12-02 ENCOUNTER — Other Ambulatory Visit (HOSPITAL_COMMUNITY): Payer: Self-pay | Admitting: Orthopedic Surgery

## 2010-12-02 DIAGNOSIS — M5020 Other cervical disc displacement, unspecified cervical region: Secondary | ICD-10-CM

## 2010-12-02 DIAGNOSIS — R29898 Other symptoms and signs involving the musculoskeletal system: Secondary | ICD-10-CM

## 2010-12-05 ENCOUNTER — Ambulatory Visit (HOSPITAL_COMMUNITY)
Admission: RE | Admit: 2010-12-05 | Discharge: 2010-12-05 | Disposition: A | Discharge status: Home / Self Care | Payer: Self-pay | Source: Ambulatory Visit | Attending: Orthopedic Surgery | Admitting: Orthopedic Surgery

## 2010-12-05 DIAGNOSIS — M25519 Pain in unspecified shoulder: Secondary | ICD-10-CM | POA: Insufficient documentation

## 2010-12-05 DIAGNOSIS — M47812 Spondylosis without myelopathy or radiculopathy, cervical region: Secondary | ICD-10-CM | POA: Insufficient documentation

## 2010-12-05 DIAGNOSIS — M542 Cervicalgia: Secondary | ICD-10-CM | POA: Insufficient documentation

## 2010-12-05 DIAGNOSIS — R29898 Other symptoms and signs involving the musculoskeletal system: Secondary | ICD-10-CM

## 2010-12-05 DIAGNOSIS — M5020 Other cervical disc displacement, unspecified cervical region: Secondary | ICD-10-CM

## 2011-03-14 ENCOUNTER — Other Ambulatory Visit: Payer: Self-pay | Admitting: Cardiology

## 2011-03-14 MED ORDER — NITROGLYCERIN 0.4 MG SL SUBL: 0.4000 mg | SUBLINGUAL_TABLET | SUBLINGUAL | Status: DC | PRN

## 2011-04-11 ENCOUNTER — Other Ambulatory Visit: Payer: Self-pay | Admitting: *Deleted

## 2011-04-11 MED ORDER — NITROGLYCERIN 0.4 MG SL SUBL: 0.4000 mg | SUBLINGUAL_TABLET | SUBLINGUAL | Status: DC | PRN

## 2011-04-11 MED ORDER — NITROGLYCERIN 0.4 MG SL SUBL: SUBLINGUAL_TABLET | SUBLINGUAL | Status: DC

## 2011-04-28 ENCOUNTER — Emergency Department (HOSPITAL_COMMUNITY)
Admission: EM | Admit: 2011-04-28 | Discharge: 2011-04-28 | Disposition: A | Discharge status: Home / Self Care | Payer: Self-pay

## 2011-07-19 ENCOUNTER — Institutional Professional Consult (permissible substitution): Payer: Self-pay | Admitting: Pulmonary Disease

## 2011-08-29 ENCOUNTER — Other Ambulatory Visit: Payer: Self-pay | Admitting: *Deleted

## 2011-08-29 MED ORDER — ROSUVASTATIN CALCIUM 5 MG PO TABS: 5.0000 mg | ORAL_TABLET | Freq: Every day | ORAL | Status: DC

## 2011-10-03 ENCOUNTER — Other Ambulatory Visit: Payer: Self-pay | Admitting: Cardiology

## 2011-10-03 MED ORDER — AMLODIPINE BESYLATE 2.5 MG PO TABS: 2.5000 mg | ORAL_TABLET | Freq: Every day | ORAL | Status: DC

## 2011-10-10 ENCOUNTER — Emergency Department (INDEPENDENT_AMBULATORY_CARE_PROVIDER_SITE_OTHER)
Admission: EM | Admit: 2011-10-10 | Discharge: 2011-10-10 | Disposition: A | Discharge status: Home / Self Care | Payer: Self-pay | Source: Home / Self Care | Attending: Family Medicine | Admitting: Family Medicine

## 2011-10-10 ENCOUNTER — Encounter (HOSPITAL_COMMUNITY): Payer: Self-pay | Admitting: Emergency Medicine

## 2011-10-10 DIAGNOSIS — T6391XA Toxic effect of contact with unspecified venomous animal, accidental (unintentional), initial encounter: Secondary | ICD-10-CM

## 2011-10-10 HISTORY — DX: Anxiety disorder, unspecified: F41.9

## 2011-10-10 HISTORY — DX: Major depressive disorder, single episode, unspecified: F32.9

## 2011-10-10 HISTORY — DX: Depression, unspecified: F32.A

## 2011-10-10 HISTORY — DX: Fibromyalgia: M79.7

## 2011-10-10 MED ORDER — BETAMETHASONE DIPROPIONATE AUG 0.05 % EX CREA: TOPICAL_CREAM | Freq: Two times a day (BID) | CUTANEOUS | Status: DC

## 2011-10-10 NOTE — ED Provider Notes (Signed)
History     CSN: [id number]  Arrival date & time 10/10/11  1337   First MD Initiated Contact with Patient 10/10/11 1414      Chief Complaint  Patient presents with  . Leg Pain    (Consider location/radiation/quality/duration/timing/severity/associated sxs/prior treatment) Patient is a 51 y.o. female presenting with leg pain. The history is provided by the patient.  Leg Pain  The incident occurred 1 to 2 hours ago. The incident occurred at home (mowing grass and felt sudden stinging burn pain to right lower leg., no bleeding, no wound.). The injury mechanism is unknown. The pain is present in the right leg. The quality of the pain is described as burning. The pain is moderate. She reports no foreign bodies present.    Past Medical History  Diagnosis Date  . Personal history of unspecified circulatory disease   . Chest pain, unspecified   . Unspecified essential hypertension   . Other and unspecified hyperlipidemia     mixed  . Headache   . Cough   . Fibromyalgia   . Depression   . Anxiety     Past Surgical History  Procedure Date  . Hematoma evacuation     vaginal hematoma 05/23/02  . Anterior perineoplasty     posterior repair. 05/16/02    Family History  Problem Relation Age of Onset  . Allergies Mother     also children  . Asthma Daughter     and granddaughter  . Heart disease      father  . Cancer Maternal Grandmother     breast cancer  . Cancer Brother     colon cancer    History  Substance Use Topics  . Smoking status: Never Smoker   . Smokeless tobacco: Not on file  . Alcohol Use: No    OB History    Grav Para Term Preterm Abortions TAB SAB Ect Mult Living                  Review of Systems  Constitutional: Negative.   Skin: Positive for rash.    Allergies  Codeine and Ivp dye  Home Medications   Current Outpatient Rx  Name Route Sig Dispense Refill  . VALIUM PO Oral Take by mouth.    Marland Kitchen HYDROCODONE-ACETAMINOPHEN 5-325 MG PO TABS  Oral Take 1 tablet by mouth every 6 (six) hours as needed.    . ALPRAZOLAM 0.5 MG PO TABS Oral Take 0.5 mg by mouth daily.      Marland Kitchen AMLODIPINE BESYLATE 2.5 MG PO TABS Oral Take 1 tablet (2.5 mg total) by mouth daily. 90 tablet 0  . BETAMETHASONE DIPROPIONATE AUG 0.05 % EX CREA Topical Apply topically 2 (two) times daily. 15 g 0  . DIPHENHYDRAMINE HCL 25 MG PO CAPS Oral Take 25 mg by mouth every 6 (six) hours as needed.      Marland Kitchen FERROUS SULFATE 325 (65 FE) MG PO TABS Oral Take 325 mg by mouth daily with breakfast.      . FUROSEMIDE 20 MG PO TABS Oral Take 20 mg by mouth as needed.      Marland Kitchen KETOROLAC TROMETHAMINE 10 MG PO TABS Oral Take 20 mg by mouth every 6 (six) hours as needed.      Marland Kitchen LIDOCAINE 5 % EX PTCH Transdermal Place 1 patch onto the skin daily. Remove & Discard patch within 12 hours or as directed by MD//3 patches at a time, 12 hours on and 12 hours off     .  LORATADINE 10 MG PO TABS Oral Take 10 mg by mouth as needed.      Marland Kitchen NITROGLYCERIN 0.4 MG SL SUBL  Place 1 tablet under tongue for chest pains every 5 minutes up to 3 doses in 15 minutes. If pain persist call 911 25 tablet 3  . ROSUVASTATIN CALCIUM 5 MG PO TABS Oral Take 1 tablet (5 mg total) by mouth daily. 30 tablet 5  . TOPIRAMATE 100 MG PO TABS Oral Take 100 mg by mouth daily.      . TRAMADOL HCL 50 MG PO TABS Oral Take 50 mg by mouth every 6 (six) hours as needed.      Marland Kitchen VALACYCLOVIR HCL 500 MG PO TABS Oral Take 500 mg by mouth as needed.      Marland Kitchen ZOLPIDEM TARTRATE 10 MG PO TABS Oral Take 10 mg by mouth at bedtime as needed.        BP 108/62  Pulse 72  Temp(Src) 98.2 F (36.8 C) (Oral)  Resp 16  SpO2 100%  Physical Exam  Nursing note and vitals reviewed. Constitutional: She appears well-developed and well-nourished.  Skin: Skin is warm and dry. Rash noted.       Tender erythema, sts locally to lat right calf with central vesicle, no bleeding, no puncture.    ED Course  Procedures (including critical care time)  Labs  Reviewed - No data to display No results found.   1. Insect sting, initial encounter       MDM          Linna Hoff, MD 10/10/11 [phone]

## 2011-10-10 NOTE — ED Notes (Signed)
Mowing the lawn this am, felt sharp apain in right lower leg, reports sing through long pants.  Washed area with soap and water and alcohol.  Pain is sharper, ice pack to site, area of redness to lower leg.

## 2011-10-14 ENCOUNTER — Encounter (HOSPITAL_COMMUNITY): Payer: Self-pay

## 2011-10-14 ENCOUNTER — Emergency Department (HOSPITAL_COMMUNITY)
Admission: EM | Admit: 2011-10-14 | Discharge: 2011-10-14 | Disposition: A | Discharge status: Home / Self Care | Payer: Self-pay | Attending: Emergency Medicine | Admitting: Emergency Medicine

## 2011-10-14 DIAGNOSIS — Z79899 Other long term (current) drug therapy: Secondary | ICD-10-CM | POA: Insufficient documentation

## 2011-10-14 DIAGNOSIS — L02419 Cutaneous abscess of limb, unspecified: Secondary | ICD-10-CM | POA: Insufficient documentation

## 2011-10-14 DIAGNOSIS — E785 Hyperlipidemia, unspecified: Secondary | ICD-10-CM | POA: Insufficient documentation

## 2011-10-14 DIAGNOSIS — L03119 Cellulitis of unspecified part of limb: Secondary | ICD-10-CM | POA: Insufficient documentation

## 2011-10-14 DIAGNOSIS — L03115 Cellulitis of right lower limb: Secondary | ICD-10-CM

## 2011-10-14 DIAGNOSIS — I10 Essential (primary) hypertension: Secondary | ICD-10-CM | POA: Insufficient documentation

## 2011-10-14 MED ORDER — SULFAMETHOXAZOLE-TMP DS 800-160 MG PO TABS
1.0000 | ORAL_TABLET | Freq: Once | ORAL | Status: AC
  Administered 2011-10-14: 1 via ORAL
  Filled 2011-10-14: qty 1

## 2011-10-14 MED ORDER — SULFAMETHOXAZOLE-TRIMETHOPRIM 800-160 MG PO TABS: 1.0000 | ORAL_TABLET | Freq: Two times a day (BID) | ORAL | Status: AC

## 2011-10-14 MED ORDER — FLUCONAZOLE 200 MG PO TABS: 200.0000 mg | ORAL_TABLET | Freq: Every day | ORAL | Status: AC

## 2011-10-14 MED ORDER — OXYCODONE-ACETAMINOPHEN 5-325 MG PO TABS
1.0000 | ORAL_TABLET | Freq: Once | ORAL | Status: AC
  Administered 2011-10-14: 1 via ORAL
  Filled 2011-10-14: qty 1

## 2011-10-14 MED ORDER — HYDROCODONE-ACETAMINOPHEN 5-500 MG PO TABS: 1.0000 | ORAL_TABLET | Freq: Four times a day (QID) | ORAL | Status: AC | PRN

## 2011-10-14 NOTE — ED Notes (Signed)
Patient given discharge paperwork; went over discharge instructions with patient.  Patient instructed to take Septra and Vicodin as directed, to not drive while taking Vicodin, to follow up within 48 hours if symptoms have not improved, and to follow up with primary care physician.

## 2011-10-14 NOTE — ED Notes (Signed)
Pt. Was  Seen by our urgent care on the 21st for a bug bite to her rt. Lower shin area.  In the last few days she has developed a sore throat. Her rt. Foot is swollen and red, also warm to touch.  She does not feel good

## 2011-10-14 NOTE — ED Provider Notes (Signed)
History   This chart was scribed for Lyanne Co, MD by Melba Coon. The patient was seen in room STRE4/STRE4 and the patient's care was started at 7:29PM.    CSN: [id number]  Arrival date & time 10/14/11  1845   First MD Initiated Contact with Patient 10/14/11 1928      Chief Complaint  Patient presents with  . Insect Bite    (Consider location/radiation/quality/duration/timing/severity/associated sxs/prior treatment) HPI Gina Espinoza is a 51 y.o. female who presents to the Emergency Department complaining of persistent, moderate to severe right lower leg erythema and edema with an onset 2 days ago.  5 days ago, pt was mowing the lawn in grass with relatively low height. Pt states that she felt something bite her leg but she couldn't tell what it was because she was wearing long pants. 4 days ago, pt presented to Urgent Care and was given topical cream; was told that it was a bee sting. Since then, sx have gotten progressively worse including the present sx along with sore throat and tongue, HA, abd pain, and general malaise. Nml fluid intake and appetite intake, but hard to swallow due to sore throat. No HA, fever, neck pain, sore throat, rash, back pain, CP, SOB, abd pain, n/v/d, dysuria, or extremity pain, edema, weakness, numbness, or tingling. Allergic to codeine, savella, and IVP dye. No other pertinent medical symptoms.  Past Medical History  Diagnosis Date  . Personal history of unspecified circulatory disease   . Chest pain, unspecified   . Unspecified essential hypertension   . Other and unspecified hyperlipidemia     mixed  . Headache   . Cough   . Fibromyalgia   . Depression   . Anxiety     Past Surgical History  Procedure Date  . Hematoma evacuation     vaginal hematoma 05/23/02  . Anterior perineoplasty     posterior repair. 05/16/02    Family History  Problem Relation Age of Onset  . Allergies Mother     also children  . Asthma Daughter     and  granddaughter  . Heart disease      father  . Cancer Maternal Grandmother     breast cancer  . Cancer Brother     colon cancer    History  Substance Use Topics  . Smoking status: Never Smoker   . Smokeless tobacco: Not on file  . Alcohol Use: No    OB History    Grav Para Term Preterm Abortions TAB SAB Ect Mult Living                  Review of Systems 10 Systems reviewed and all are negative for acute change except as noted in the HPI.   Allergies  Savella; Codeine; and Ivp dye  Home Medications   Current Outpatient Rx  Name Route Sig Dispense Refill  . ALPRAZOLAM 0.5 MG PO TABS Oral Take 1 mg by mouth daily.     Marland Kitchen AMLODIPINE BESYLATE 2.5 MG PO TABS Oral Take 2.5 mg by mouth daily.    Marland Kitchen BETAMETHASONE DIPROPIONATE AUG 0.05 % EX CREA Topical Apply 1 application topically 2 (two) times daily.    Marland Kitchen DIAZEPAM 10 MG PO TABS Oral Take 10 mg by mouth at bedtime as needed. For sleep    . DIMENHYDRINATE 50 MG PO TABS Oral Take 50 mg by mouth every 8 (eight) hours as needed. For motion sickness    . DIPHENHYDRAMINE HCL 25  MG PO CAPS Oral Take 25 mg by mouth every 6 (six) hours as needed.      Marland Kitchen ESCITALOPRAM OXALATE 20 MG PO TABS Oral Take 20 mg by mouth daily.    Marland Kitchen FERROUS SULFATE 325 (65 FE) MG PO TABS Oral Take 325 mg by mouth daily with breakfast.      . FUROSEMIDE 20 MG PO TABS Oral Take 20 mg by mouth as needed.      Marland Kitchen GABAPENTIN 100 MG PO CAPS Oral Take 100 mg by mouth 3 (three) times daily.    Marland Kitchen HYDROCODONE-ACETAMINOPHEN 5-325 MG PO TABS Oral Take 1 tablet by mouth every 6 (six) hours as needed.    Marland Kitchen KETOROLAC TROMETHAMINE 10 MG PO TABS Oral Take 20 mg by mouth every 6 (six) hours as needed.      Marland Kitchen LIDOCAINE 5 % EX PTCH Transdermal Place 1 patch onto the skin daily. Remove & Discard patch within 12 hours or as directed by MD//3 patches at a time, 12 hours on and 12 hours off     . LORATADINE 10 MG PO TABS Oral Take 10 mg by mouth as needed.      Marland Kitchen NITROGLYCERIN 0.4 MG SL  SUBL Sublingual Place 0.4 mg under the tongue every 5 (five) minutes as needed. Place 1 tablet under tongue for chest pains every 5 minutes up to 3 doses in 15 minutes. If pain persist call 911    . PROMETHAZINE HCL 25 MG PO TABS Oral Take 25 mg by mouth every 6 (six) hours as needed. For nausea    . ROSUVASTATIN CALCIUM 5 MG PO TABS Oral Take 5 mg by mouth daily.    . TOPIRAMATE 100 MG PO TABS Oral Take 100 mg by mouth daily.      . TRAMADOL HCL 50 MG PO TABS Oral Take 50 mg by mouth every 6 (six) hours as needed.      Marland Kitchen VALACYCLOVIR HCL 500 MG PO TABS Oral Take 500 mg by mouth as needed.      Marland Kitchen ZOLPIDEM TARTRATE 10 MG PO TABS Oral Take 10 mg by mouth at bedtime as needed.        BP 119/70  Pulse 65  Temp(Src) 98 F (36.7 C) (Oral)  Resp 20  SpO2 99%  Physical Exam  Nursing note and vitals reviewed. Constitutional: She is oriented to person, place, and time. She appears well-developed and well-nourished. No distress.  HENT:  Head: Normocephalic and atraumatic.       Post oropharynx patent with uvula midline  Eyes: EOM are normal.  Neck: Neck supple. No tracheal deviation present.  Cardiovascular: Normal rate.        Nml dp and pt pulses in right foot  Pulmonary/Chest: Effort normal. No respiratory distress.  Musculoskeletal: Normal range of motion. She exhibits no edema (no proximal or distal swelling of rt foot).  Neurological: She is alert and oriented to person, place, and time.  Skin: Skin is warm and dry. There is erythema (Mild erythema of right lateral distal tibia).  Psychiatric: She has a normal mood and affect. Her behavior is normal.    ED Course  Procedures (including critical care time)  DIAGNOSTIC STUDIES: Oxygen Saturation is 99% on room air, normal by my interpretation.    COORDINATION OF CARE:  7:34PM - EDMD will order abx and pain medicine for the pt   Labs Reviewed - No data to display No results found.   1. Cellulitis of right leg  MDM    Patient has what appears to be a small saline is of her right distal lateral ankle.  There is no induration or fluctuance or anything to suggest incision and drainage is necessary.  She has normal pulses in her right foot.  Home with antibiotics.  The patient will return in 48 hours if her symptoms worsen.  She understands to return the emergency department sooner for any new or worsening symptoms  I personally performed the services described in this documentation, which was scribed in my presence. The recorded information has been reviewed and considered.          Lyanne Co, MD 10/14/11 [phone]

## 2011-10-14 NOTE — ED Notes (Signed)
Dr. Campos at bedside   

## 2011-10-14 NOTE — Discharge Instructions (Signed)

## 2011-10-14 NOTE — ED Notes (Signed)
Patient complaining of an insect bite to her right lower leg (shin); patient states that she was out cutting grass when she was bitten -- patient denies seeing the type of insect.  Patient states that she was seen at Urgent Care on the 21st for treatment.  Patient states that her symptoms have not gotten any better; patient complainig of swelling and redness to right leg.  Patient also reports recent sore throat during the past few days; denies fever.  Patient alert and oriented x4; PERRL present.  Patient able to move all extremities without difficulty.  Will continue to monitor.

## 2011-10-31 ENCOUNTER — Encounter: Payer: Self-pay | Admitting: *Deleted

## 2011-11-08 ENCOUNTER — Encounter: Payer: Self-pay | Admitting: Cardiology

## 2011-11-08 ENCOUNTER — Ambulatory Visit (INDEPENDENT_AMBULATORY_CARE_PROVIDER_SITE_OTHER): Payer: Medicare Other | Admitting: Cardiology

## 2011-11-08 VITALS — BP 104/74 | HR 49 | Ht 66.0 in | Wt 159.0 lb

## 2011-11-08 DIAGNOSIS — R079 Chest pain, unspecified: Secondary | ICD-10-CM

## 2011-11-08 DIAGNOSIS — E785 Hyperlipidemia, unspecified: Secondary | ICD-10-CM | POA: Diagnosis not present

## 2011-11-08 DIAGNOSIS — I10 Essential (primary) hypertension: Secondary | ICD-10-CM

## 2011-11-08 MED ORDER — NITROGLYCERIN 0.4 MG SL SUBL: 0.4000 mg | SUBLINGUAL_TABLET | SUBLINGUAL | Status: DC | PRN

## 2011-11-08 MED ORDER — ROSUVASTATIN CALCIUM 5 MG PO TABS: 5.0000 mg | ORAL_TABLET | Freq: Every day | ORAL | Status: DC

## 2011-11-08 MED ORDER — AMLODIPINE BESYLATE 2.5 MG PO TABS: 2.5000 mg | ORAL_TABLET | Freq: Every day | ORAL | Status: DC

## 2011-11-08 NOTE — Patient Instructions (Addendum)
Physical appointment with Dr. Susann Givens on 11/22/2011 at 9:30am.  Come to your appt fasting and bring your insurance cards.

## 2011-11-08 NOTE — Progress Notes (Signed)
HPI Gina Espinoza comes in today for her history of chest pain and cardiac risk factors.  She is very fatigued. She only sleeps 2 hours at night. She has several medications that she uses for this but has not seen her primary care in quite some time.  She has dyspnea on exertion but no angina or chest pain.  She is due to blood work on her statin.  Past Medical History  Diagnosis Date  . Personal history of unspecified circulatory disease   . Chest pain, unspecified   . Unspecified essential hypertension   . Other and unspecified hyperlipidemia     mixed  . Headache   . Cough   . Fibromyalgia   . Depression   . Anxiety     Current Outpatient Prescriptions  Medication Sig Dispense Refill  . ALPRAZolam (XANAX) 0.5 MG tablet Take 1 mg by mouth daily.       Marland Kitchen amLODipine (NORVASC) 2.5 MG tablet Take 2.5 mg by mouth daily.      Marland Kitchen augmented betamethasone dipropionate (DIPROLENE-AF) 0.05 % cream Apply 1 application topically 2 (two) times daily.      . diazepam (VALIUM) 10 MG tablet Take 10 mg by mouth at bedtime as needed. For sleep      . dimenhyDRINATE (DRAMAMINE) 50 MG tablet Take 50 mg by mouth every 8 (eight) hours as needed. For motion sickness      . diphenhydrAMINE (BENADRYL) 25 mg capsule Take 25 mg by mouth every 6 (six) hours as needed.        Marland Kitchen escitalopram (LEXAPRO) 20 MG tablet Take 20 mg by mouth daily.      . ferrous sulfate 325 (65 FE) MG tablet Take 325 mg by mouth daily with breakfast.        . furosemide (LASIX) 20 MG tablet Take 20 mg by mouth as needed.        . gabapentin (NEURONTIN) 100 MG capsule Take 100 mg by mouth 3 (three) times daily.      Marland Kitchen HYDROcodone-acetaminophen (NORCO) 5-325 MG per tablet Take 1 tablet by mouth every 6 (six) hours as needed.      Marland Kitchen ketorolac (TORADOL) 10 MG tablet Take 20 mg by mouth every 6 (six) hours as needed.        . lidocaine (LIDODERM) 5 % Place 1 patch onto the skin daily. Remove & Discard patch within 12 hours or as directed by  MD//3 patches at a time, 12 hours on and 12 hours off       . loratadine (CLARITIN) 10 MG tablet Take 10 mg by mouth as needed.        . nitroGLYCERIN (NITROSTAT) 0.4 MG SL tablet Place 0.4 mg under the tongue every 5 (five) minutes as needed. Place 1 tablet under tongue for chest pains every 5 minutes up to 3 doses in 15 minutes. If pain persist call 911      . oxyCODONE-acetaminophen (PERCOCET) 5-325 MG per tablet Take 1 tablet by mouth as directed.      . promethazine (PHENERGAN) 25 MG tablet Take 25 mg by mouth every 6 (six) hours as needed. For nausea      . rosuvastatin (CRESTOR) 5 MG tablet Take 5 mg by mouth daily.      Marland Kitchen sulfamethoxazole-trimethoprim (BACTRIM DS) 800-160 MG per tablet Take 1 tablet by mouth as directed.       . topiramate (TOPAMAX) 100 MG tablet Take 100 mg by mouth daily.        Marland Kitchen  traMADol (ULTRAM) 50 MG tablet Take 50 mg by mouth every 6 (six) hours as needed.        . valACYclovir (VALTREX) 500 MG tablet Take 500 mg by mouth as needed.        . zolpidem (AMBIEN) 10 MG tablet Take 10 mg by mouth at bedtime as needed.          Allergies  Allergen Reactions  . Savella (Milnacipran Hcl) Anaphylaxis  . Codeine   . Ivp Dye (Iodinated Diagnostic Agents)     A fibromyalgia medicine--cannot state name     Family History  Problem Relation Age of Onset  . Allergies Mother     also children  . Asthma Daughter   . Heart disease Father   . Breast cancer Maternal Grandmother   . Colon cancer Brother   . Asthma Grandchild     History   Social History  . Marital Status: Single    Spouse Name: N/A    Number of Children: N/A  . Years of Education: N/A   Occupational History  . Not on file.   Social History Main Topics  . Smoking status: Never Smoker   . Smokeless tobacco: Not on file  . Alcohol Use: No  . Drug Use: No  . Sexually Active: Not on file   Other Topics Concern  . Not on file   Social History Narrative   Single, children, works as a Firefighter.     ROS ALL NEGATIVE EXCEPT THOSE NOTED IN HPI  PE  General Appearance: well developed, well nourished in no acute distress, looks very tired HEENT: symmetrical face, PERRLA, good dentition  Neck: no JVD, thyromegaly, or adenopathy, trachea midline Chest: symmetric without deformity Cardiac: PMI non-displaced, RRR, normal S1, S2, no gallop or murmur Lung: clear to ausculation and percussion Vascular: all pulses full without bruits  Abdominal: nondistended, nontender, good bowel sounds, no HSM, no bruits Extremities: no cyanosis, clubbing or edema, no sign of DVT, no varicosities  Skin: normal color, no rashes Neuro: alert and oriented x 3, non-focal Pysch: normal affect  EKG Sinus bradycardia, rate 49 per minute, no ST segment changes. BMET    Component Value Date/Time   NA 140 12/17/2008 0901   K 4.2 12/17/2008 0901   CL 103 12/17/2008 0901   CO2 30 12/17/2008 0901   GLUCOSE 94 12/17/2008 0901   BUN 13 12/17/2008 0901   CREATININE 0.9 12/17/2008 0901   CALCIUM 9.3 12/17/2008 0901   GFRNONAA 85.68 12/17/2008 0901   GFRAA 86 08/22/2007 1128    Lipid Panel     Component Value Date/Time   CHOL 130 11/17/2010 1307   TRIG 40.0 11/17/2010 1307   HDL 49.60 11/17/2010 1307   CHOLHDL 3 11/17/2010 1307   VLDL 8.0 11/17/2010 1307   LDLCALC 72 11/17/2010 1307    CBC No results found for this basename: wbc, rbc, hgb, hct, plt, mcv, mch, mchc, rdw, neutrabs, lymphsabs, monoabs, eosabs, basosabs

## 2011-11-08 NOTE — Assessment & Plan Note (Signed)
Resolved. Continue secondary preventative therapy. See cardiology back when necessary.

## 2011-11-08 NOTE — Assessment & Plan Note (Signed)
Have referred her to her primary care for blood work.

## 2011-11-08 NOTE — Assessment & Plan Note (Signed)
Well-controlled. Called  Dr. Susann Givens  to schedule followup for blood work and annual evaluation.

## 2011-11-08 NOTE — Addendum Note (Signed)
Addended by: Waymon Budge on: 11/08/2011 11:05 AM   Modules accepted: Orders

## 2011-11-09 DIAGNOSIS — Z9189 Other specified personal risk factors, not elsewhere classified: Secondary | ICD-10-CM | POA: Diagnosis not present

## 2011-11-09 DIAGNOSIS — R87619 Unspecified abnormal cytological findings in specimens from cervix uteri: Secondary | ICD-10-CM | POA: Diagnosis not present

## 2011-11-09 DIAGNOSIS — Z124 Encounter for screening for malignant neoplasm of cervix: Secondary | ICD-10-CM | POA: Diagnosis not present

## 2011-11-09 DIAGNOSIS — IMO0001 Reserved for inherently not codable concepts without codable children: Secondary | ICD-10-CM | POA: Diagnosis not present

## 2011-11-09 DIAGNOSIS — Z1231 Encounter for screening mammogram for malignant neoplasm of breast: Secondary | ICD-10-CM | POA: Diagnosis not present

## 2011-11-09 DIAGNOSIS — Z79899 Other long term (current) drug therapy: Secondary | ICD-10-CM | POA: Diagnosis not present

## 2011-11-14 ENCOUNTER — Encounter: Payer: Self-pay | Admitting: Internal Medicine

## 2011-11-14 LAB — HM MAMMOGRAPHY: HM Mammogram: NORMAL

## 2011-11-16 LAB — HM PAP SMEAR: HM Pap smear: NORMAL

## 2011-11-22 ENCOUNTER — Encounter: Payer: Self-pay | Admitting: Family Medicine

## 2011-11-22 ENCOUNTER — Ambulatory Visit (INDEPENDENT_AMBULATORY_CARE_PROVIDER_SITE_OTHER): Payer: Medicare Other | Admitting: Family Medicine

## 2011-11-22 VITALS — BP 110/70 | HR 55 | Ht 66.0 in | Wt 159.0 lb

## 2011-11-22 DIAGNOSIS — M797 Fibromyalgia: Secondary | ICD-10-CM | POA: Insufficient documentation

## 2011-11-22 DIAGNOSIS — E559 Vitamin D deficiency, unspecified: Secondary | ICD-10-CM

## 2011-11-22 DIAGNOSIS — IMO0001 Reserved for inherently not codable concepts without codable children: Secondary | ICD-10-CM | POA: Diagnosis not present

## 2011-11-22 DIAGNOSIS — I10 Essential (primary) hypertension: Secondary | ICD-10-CM | POA: Diagnosis not present

## 2011-11-22 DIAGNOSIS — F32A Depression, unspecified: Secondary | ICD-10-CM

## 2011-11-22 DIAGNOSIS — E785 Hyperlipidemia, unspecified: Secondary | ICD-10-CM

## 2011-11-22 DIAGNOSIS — F329 Major depressive disorder, single episode, unspecified: Secondary | ICD-10-CM

## 2011-11-22 NOTE — Progress Notes (Signed)
  Subjective:    Patient ID: Gina Espinoza, female    DOB: 1961/01/07, 51 y.o.   MRN: [id number]  HPI She is here for an interval evaluation. She has a very long and complicated history. She does have fibromyalgia and is being followed by Dr.Deveshwar. She also sees Dr. Daleen Squibb for cardiac issues,Dr. Lerry Paterson for chronic neck and back discomfort. She is followed by Dr. Adalberto Ill or her gynecologic care. She is now on disability because of all of these issues. She complains of multiple aches and pains including difficulty with constant pain, sleep, headache, depression. She is on multiple medications listed in the chart. Apparently acupuncture was recommended by her rheumatologist however she has not followed up on that. She also apparently has a history of vitamin D deficiency and has intermittently been taking her vitamin D supplements. Review of Systems     Objective:   Physical Exam alert and in no distress. Tympanic membranes and canals are normal. Throat is clear. Tonsils are normal. Neck is supple without adenopathy or thyromegaly. Cardiac exam shows a regular sinus rhythm without murmurs or gallops. Lungs are clear to auscultation. Abdominal exam shows no masses or tenderness with normal bowel sounds. Reflexes are normal. Blood work was reviewed.      Assessment & Plan:   1. HYPERLIPIDEMIA-MIXED    2. HYPERTENSION, UNSPECIFIED    3. Fibromyalgia syndrome    4. Vitamin d deficiency  Vitamin D 25 hydroxy  5. Depression     discussed her overall care. Encouraged her to get involved in counseling to help deal with all of these issues. Also encouraged her to try acupuncture to see if this would help.

## 2011-11-23 LAB — VITAMIN D 25 HYDROXY (VIT D DEFICIENCY, FRACTURES): Vit D, 25-Hydroxy: 24 ng/mL — ABNORMAL LOW (ref 30–89)

## 2011-12-20 DIAGNOSIS — H16219 Exposure keratoconjunctivitis, unspecified eye: Secondary | ICD-10-CM | POA: Diagnosis not present

## 2011-12-23 DIAGNOSIS — H16219 Exposure keratoconjunctivitis, unspecified eye: Secondary | ICD-10-CM | POA: Diagnosis not present

## 2012-01-10 DIAGNOSIS — N949 Unspecified condition associated with female genital organs and menstrual cycle: Secondary | ICD-10-CM | POA: Diagnosis not present

## 2012-03-15 DIAGNOSIS — N949 Unspecified condition associated with female genital organs and menstrual cycle: Secondary | ICD-10-CM | POA: Diagnosis not present

## 2012-03-22 DIAGNOSIS — N760 Acute vaginitis: Secondary | ICD-10-CM | POA: Diagnosis not present

## 2012-03-29 DIAGNOSIS — Z23 Encounter for immunization: Secondary | ICD-10-CM | POA: Diagnosis not present

## 2012-04-26 DIAGNOSIS — M1710 Unilateral primary osteoarthritis, unspecified knee: Secondary | ICD-10-CM | POA: Diagnosis not present

## 2012-04-26 DIAGNOSIS — M542 Cervicalgia: Secondary | ICD-10-CM | POA: Diagnosis not present

## 2012-04-26 DIAGNOSIS — M5030 Other cervical disc degeneration, unspecified cervical region: Secondary | ICD-10-CM | POA: Diagnosis not present

## 2012-04-26 DIAGNOSIS — M25519 Pain in unspecified shoulder: Secondary | ICD-10-CM | POA: Diagnosis not present

## 2012-06-07 ENCOUNTER — Ambulatory Visit (INDEPENDENT_AMBULATORY_CARE_PROVIDER_SITE_OTHER): Payer: Medicare Other | Admitting: *Deleted

## 2012-06-07 ENCOUNTER — Encounter: Payer: Self-pay | Admitting: *Deleted

## 2012-06-07 VITALS — BP 122/80 | HR 63 | Temp 98.4°F | Wt 163.8 lb

## 2012-06-07 DIAGNOSIS — R079 Chest pain, unspecified: Secondary | ICD-10-CM

## 2012-06-07 NOTE — Progress Notes (Signed)
Patient walked in complaining of left sided chest pain under breast,  worse with deep breathing, talking, or coughing. Patient states pain started last night took 2 NTG and rested and pain subsided.  Patient states pain came back this afternoon as well as a cough. Patient states dry cough, denies sore throat or congestion. Patient states pain 6 out of a 10. EKG done and reviewed by Dr Elease Hashimoto, nothing acute.  Blood pressure 122/80, O2sat 98%, HR 63, and weight 163.8 lbs. Discussed with Dr Elease Hashimoto and he recommended patient go to the emergency room for further testing. Patient verbalized understanding but stated she had to get her daughter from bus stop first.  Did encourage patient to go to ED. Patient left office stating she would go after getting things with her daughter worked out.

## 2012-07-01 IMAGING — CR DG ORBITS FOR FOREIGN BODY
2 series · 2 of 2 positions shown · non-contrast
Comparison: None.

CLINICAL DATA: 49-year-old female with planned MRI and history of
metal exposure to the orbits.

ORBITS FOR FOREIGN BODY - 2 VIEW

[w waters * (1 of 2)]
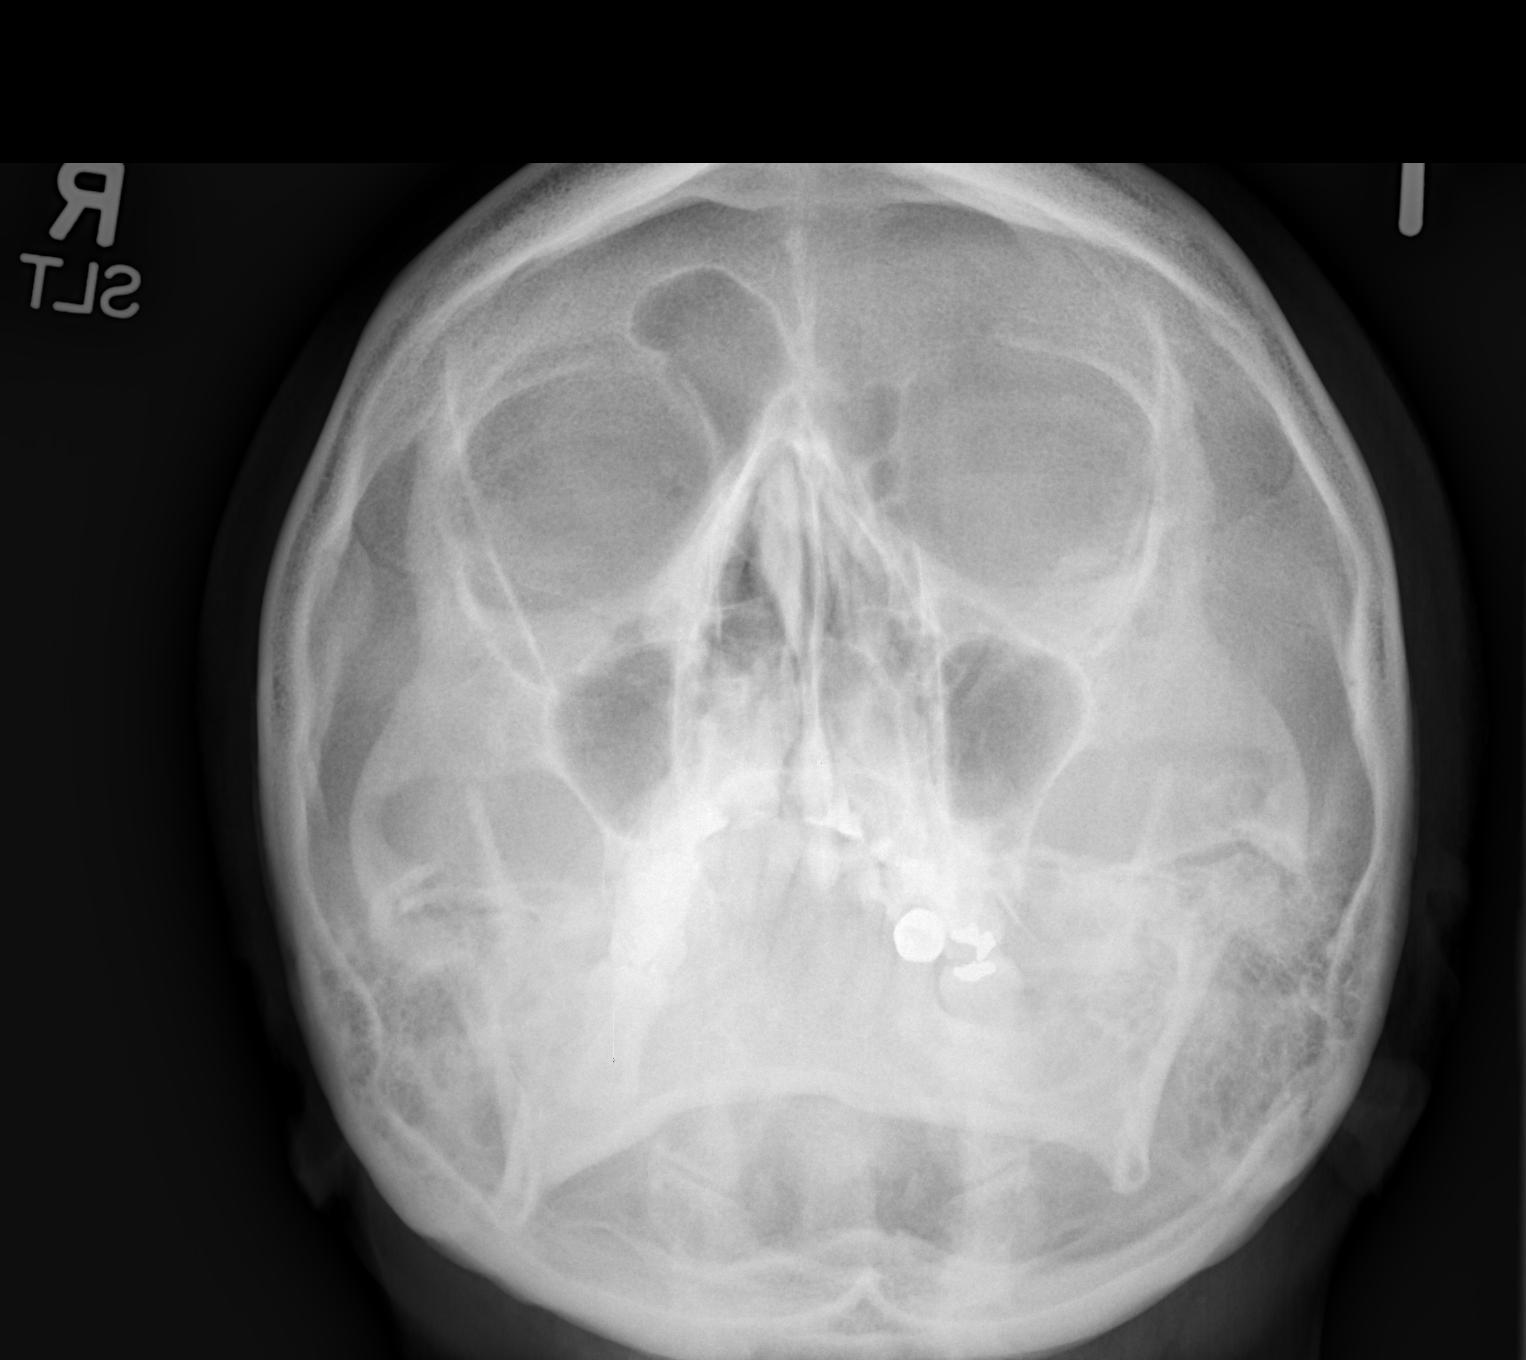

[w waters * (2 of 2)]
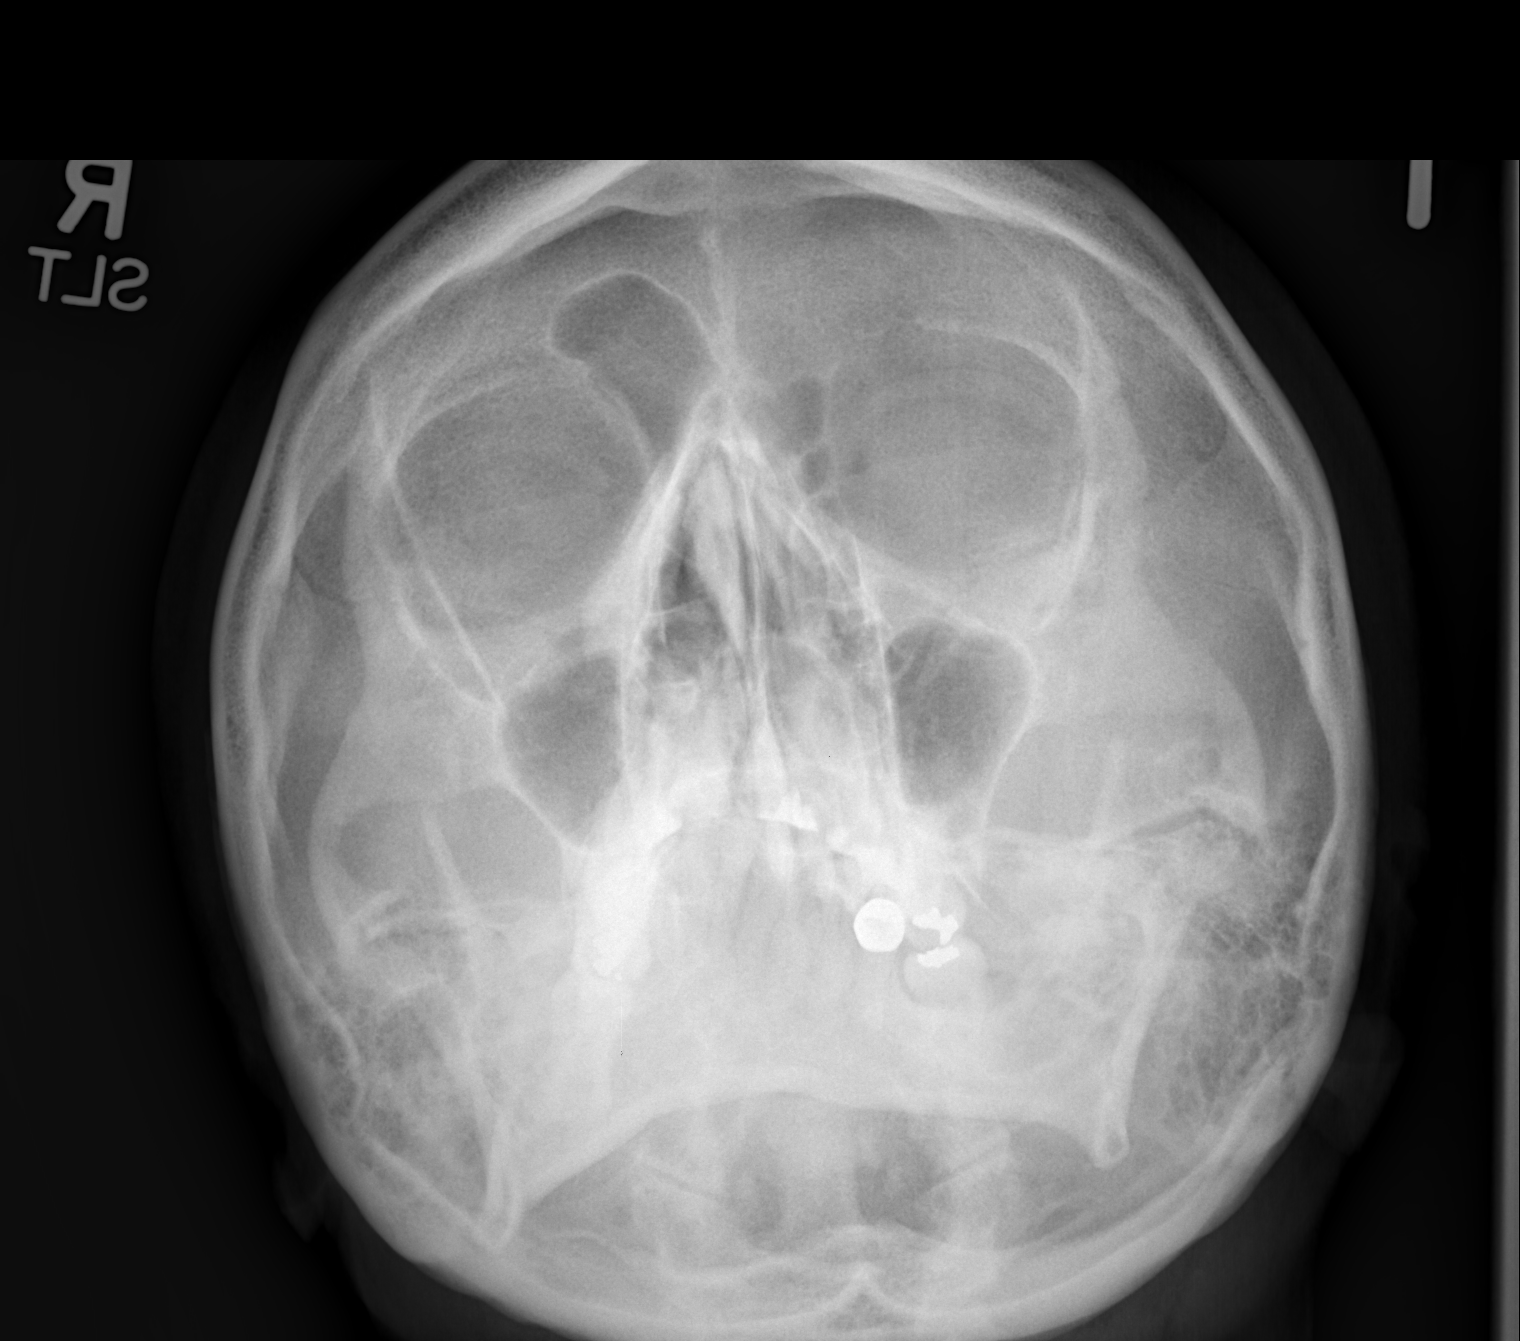

[2 of 2 positions shown; findings below may reference images not displayed]

FINDINGS: There is no evidence of metallic foreign body within the
orbits.  No significant bone abnormality identified. .
IMPRESSION: No evidence of metallic foreign body within the orbits.

## 2012-09-13 ENCOUNTER — Ambulatory Visit (INDEPENDENT_AMBULATORY_CARE_PROVIDER_SITE_OTHER): Payer: Medicare Other | Admitting: Medical

## 2012-09-13 ENCOUNTER — Telehealth: Payer: Self-pay | Admitting: Medical

## 2012-09-13 ENCOUNTER — Encounter: Payer: Self-pay | Admitting: Medical

## 2012-09-13 ENCOUNTER — Ambulatory Visit
Admission: RE | Admit: 2012-09-13 | Discharge: 2012-09-13 | Disposition: A | Discharge status: Home / Self Care | Payer: Medicare Other | Source: Ambulatory Visit | Attending: Medical | Admitting: Medical

## 2012-09-13 ENCOUNTER — Other Ambulatory Visit: Payer: Self-pay | Admitting: Medical

## 2012-09-13 VITALS — BP 98/60 | HR 75 | Temp 97.8°F | Resp 18 | Wt 168.0 lb

## 2012-09-13 DIAGNOSIS — J309 Allergic rhinitis, unspecified: Secondary | ICD-10-CM

## 2012-09-13 DIAGNOSIS — R0602 Shortness of breath: Secondary | ICD-10-CM

## 2012-09-13 DIAGNOSIS — R05 Cough: Secondary | ICD-10-CM | POA: Diagnosis not present

## 2012-09-13 DIAGNOSIS — R059 Cough, unspecified: Secondary | ICD-10-CM

## 2012-09-13 DIAGNOSIS — Z79899 Other long term (current) drug therapy: Secondary | ICD-10-CM

## 2012-09-13 MED ORDER — METHYLPREDNISOLONE SODIUM SUCC 125 MG IJ SOLR
125.0000 mg | Freq: Once | INTRAMUSCULAR | Status: AC
  Administered 2012-09-13: 125 mg via INTRAMUSCULAR

## 2012-09-13 MED ORDER — LIDOCAINE VISCOUS 2 % MT SOLN: OROMUCOSAL | Status: DC

## 2012-09-13 MED ORDER — METHYLPREDNISOLONE 4 MG PO KIT: PACK | ORAL | Status: DC

## 2012-09-13 NOTE — Progress Notes (Signed)
Subjective: Here for cough.   Been coughing since a week ago.   She notes rash/mouth broke out last week,been using some left over Keflex for this.  Having trouble breathing, worse at night.   Doesn't sleep well in general, but cough is nonstop at night.   Using OTC cough medication without relief, Theraflu not helping, benadryl and Claritin.  Having some trouble breathing with stairs and night with bad cough.  Having sneezing, sore throat, some sinus pressure at night,no ear pain, no runny nose, no watery eyes or itchy eyes.   No sick contacts.   No hx/o asthma or lung disease.   Non smoker.  No swelling in legs.   No prior inhaler.  She is followed by cardiology.  Hx/o angina.  Has nitrotab on hand for angina.  No chest pain currently other than from coughing so much.     Past Medical History  Diagnosis Date  . Personal history of unspecified circulatory disease   . Chest pain, unspecified   . Unspecified essential hypertension   . Other and unspecified hyperlipidemia     mixed  . Headache   . Cough   . Fibromyalgia   . Depression   . Anxiety    ROS as in subjective  Objective: Filed Vitals:   09/13/12 1456  BP: 98/60  Pulse: 75  Temp: 97.8 F (36.6 C)  Resp: 18    General appearance: alert, no distress, WD/WN, cough spells HEENT: normocephalic, sclerae anicteric, TMs pearly, nares patent, no discharge or erythema, pharynx normal Oral cavity: MMM, no lesions Neck: supple, no lymphadenopathy, no thyromegaly, no masses Heart: RRR, normal S1, S2, no murmurs Lungs: +frequent cough, +rhonchi, no wheezes or rales Pulses: 2+ symmetric, upper and lower extremities, normal cap refill No calve asymmetry or tenderness Ext: no edema, cyanosis or clubbing  Assessment: Encounter Diagnoses  Name Primary?  . Cough Yes  . Allergic rhinitis   . Polypharmacy   . Shortness of breath      Plan: Gave round of albuterol to see if any improvement.  No improvement.   I suspect allergen  trigger, but will get CXR to make sure no pneumonia.    Solumedrol IM given in office.      Patient Instructions  Continue Benadryl at bedtime.  Use neti pot twice daily to rinse out nose.  Use salt water gargles, warm fluids, and lidocaine rinse to help with sore throat.  We gave you a shot of Solumedrol today in office to help the symptoms.   Use the Nasaonex nasal spray, 2 sprays twice daily to help the nasal congestion.  Drink plenty of water.    We will call with chest xray results.   Call back Monday to update me on your symptoms.   Your oxycodone can help with cough suppression.   OTC Delsym is fine for cough too.

## 2012-09-13 NOTE — Patient Instructions (Addendum)
Continue Benadryl at bedtime.  Use neti pot twice daily to rinse out nose.  Use salt water gargles, warm fluids, and lidocaine rinse to help with sore throat.  We gave you a shot of Solumedrol today in office to help the symptoms.   Use the Nasaonex nasal spray, 2 sprays twice daily to help the nasal congestion.  Drink plenty of water.    We will call with chest xray results.   Call back Monday to update me on your symptoms.   Your oxycodone can help with cough suppression.   OTC Delsym is fine for cough too.

## 2012-09-24 NOTE — Telephone Encounter (Signed)
error 

## 2012-09-25 DIAGNOSIS — N949 Unspecified condition associated with female genital organs and menstrual cycle: Secondary | ICD-10-CM | POA: Diagnosis not present

## 2012-10-17 ENCOUNTER — Encounter: Payer: Self-pay | Admitting: Family Medicine

## 2012-10-17 ENCOUNTER — Ambulatory Visit (INDEPENDENT_AMBULATORY_CARE_PROVIDER_SITE_OTHER): Payer: Medicare Other | Admitting: Family Medicine

## 2012-10-17 VITALS — BP 106/70 | HR 60 | Temp 98.4°F | Ht 66.0 in | Wt 156.0 lb

## 2012-10-17 DIAGNOSIS — B370 Candidal stomatitis: Secondary | ICD-10-CM | POA: Diagnosis not present

## 2012-10-17 MED ORDER — NYSTATIN 100000 UNIT/ML MT SUSP: 500000.0000 [IU] | Freq: Four times a day (QID) | OROMUCOSAL | Status: DC

## 2012-10-17 NOTE — Progress Notes (Signed)
Chief Complaint  Patient presents with  . tongue pain and bumps    tongue pain and bumps. red bumps on tongue and roof of mouth, and tongue feels swollen.. when brushing teeth she has a burning sensation   Last week she noticed some burning pain when she was brushing her teeth.  Hasn't changed toothpaste.  This week the tongue has gotten more painful, feels like it is on fire.  Tongue feels slightly swollen, and looks white.  The white doesn't come off with brushing or scratching it with her finger.    Drinking something cold feels good, but other foods bother her, hard to swallow. No recent antibiotics (over a month ago).  She had a course of steroids 1 month ago.  Past Medical History  Diagnosis Date  . Personal history of unspecified circulatory disease   . Chest pain, unspecified   . Unspecified essential hypertension   . Other and unspecified hyperlipidemia     mixed  . Headache(784.0)   . Cough   . Fibromyalgia   . Depression   . Anxiety    Past Surgical History  Procedure Laterality Date  . Hematoma evacuation      vaginal hematoma 05/23/02  . Anterior perineoplasty      posterior repair. 05/16/02   History   Social History  . Marital Status: Single    Spouse Name: N/A    Number of Children: N/A  . Years of Education: N/A   Occupational History  . Not on file.   Social History Main Topics  . Smoking status: Never Smoker   . Smokeless tobacco: Not on file  . Alcohol Use: No  . Drug Use: No  . Sexually Active: Not Currently   Other Topics Concern  . Not on file   Social History Narrative   Single, children, works as a Midwife.  On disability (fibromyalgia, heart, depression/anxiety)   Current outpatient prescriptions:ALPRAZolam (XANAX) 0.5 MG tablet, Take 1 mg by mouth daily. , Disp: , Rfl: ;  alprazolam (XANAX) 2 MG tablet, Take 2 mg by mouth at bedtime as needed for sleep (as needed)., Disp: , Rfl: ;  amLODipine (NORVASC) 2.5 MG tablet, Take 1 tablet (2.5  mg total) by mouth daily., Disp: 30 tablet, Rfl: 11;  diazepam (VALIUM) 10 MG tablet, Take 10 mg by mouth at bedtime as needed. For sleep, Disp: , Rfl:  diphenhydrAMINE (BENADRYL) 25 mg capsule, Take 25 mg by mouth every 6 (six) hours as needed.  , Disp: , Rfl: ;  escitalopram (LEXAPRO) 20 MG tablet, Take 20 mg by mouth daily., Disp: , Rfl: ;  estradiol-norethindrone (ACTIVELLA) 1-0.5 MG per tablet, Take 1 tablet by mouth daily., Disp: , Rfl: ;  fluconazole (DIFLUCAN) 150 MG tablet, Take 150 mg by mouth once., Disp: , Rfl:  gabapentin (NEURONTIN) 100 MG capsule, Take 100 mg by mouth 3 (three) times daily., Disp: , Rfl: ;  HYDROcodone-acetaminophen (NORCO) 5-325 MG per tablet, Take 1 tablet by mouth every 6 (six) hours as needed., Disp: , Rfl: ;  HYDROmorphone (DILAUDID) 2 MG tablet, Take 2 mg by mouth every 4 (four) hours as needed for pain., Disp: , Rfl: ;  ketorolac (TORADOL) 10 MG tablet, Take 20 mg by mouth every 6 (six) hours as needed.  , Disp: , Rfl:  lidocaine (LIDODERM) 5 %, Place 1 patch onto the skin daily. Remove & Discard patch within 12 hours or as directed by MD//3 patches at a time, 12 hours on and 12  hours off , Disp: , Rfl: ;  lidocaine (XYLOCAINE) 2 % solution, 10 cc swish and spit QID, Disp: 150 mL, Rfl: 0;  loratadine (CLARITIN) 10 MG tablet, Take 10 mg by mouth as needed.  , Disp: , Rfl:  metaxalone (SKELAXIN) 800 MG tablet, Take 800 mg by mouth 3 (three) times daily., Disp: , Rfl: ;  methocarbamol (ROBAXIN) 500 MG tablet, Take 500 mg by mouth 3 (three) times daily., Disp: , Rfl: ;  morphine (MS CONTIN) 15 MG 12 hr tablet, Take 15 mg by mouth 2 (two) times daily., Disp: , Rfl: ;  morphine (MSIR) 15 MG tablet, Take 15 mg by mouth every 4 (four) hours as needed for pain., Disp: , Rfl:  nitroGLYCERIN (NITROSTAT) 0.4 MG SL tablet, Place 1 tablet (0.4 mg total) under the tongue every 5 (five) minutes as needed. Place 1 tablet under tongue for chest pains every 5 minutes up to 3 doses in 15  minutes. If pain persist call 911, Disp: 25 tablet, Rfl: 11;  olopatadine (PATANOL) 0.1 % ophthalmic solution, 1 drop 2 (two) times daily., Disp: , Rfl:  oxyCODONE-acetaminophen (PERCOCET) 5-325 MG per tablet, Take 1 tablet by mouth as directed., Disp: , Rfl: ;  promethazine (PHENERGAN) 25 MG tablet, Take 25 mg by mouth every 6 (six) hours as needed. For nausea, Disp: , Rfl: ;  rosuvastatin (CRESTOR) 5 MG tablet, Take 1 tablet (5 mg total) by mouth daily., Disp: 30 tablet, Rfl: 11;  valACYclovir (VALTREX) 500 MG tablet, Take 500 mg by mouth as needed.  , Disp: , Rfl:  zolpidem (AMBIEN) 10 MG tablet, Take 10 mg by mouth at bedtime as needed.  , Disp: , Rfl: ;  cephALEXin (KEFLEX) 500 MG capsule, Take 500 mg by mouth 4 (four) times daily., Disp: , Rfl: ;  cholecalciferol (VITAMIN D) 400 UNITS TABS, Take by mouth., Disp: , Rfl: ;  dimenhyDRINATE (DRAMAMINE) 50 MG tablet, Take 50 mg by mouth every 8 (eight) hours as needed. For motion sickness, Disp: , Rfl:  ferrous sulfate 325 (65 FE) MG tablet, Take 325 mg by mouth daily with breakfast.  , Disp: , Rfl: ;  furosemide (LASIX) 20 MG tablet, Take 20 mg by mouth as needed.  , Disp: , Rfl: ;  nystatin (MYCOSTATIN) 100000 UNIT/ML suspension, Take 5 mLs (500,000 Units total) by mouth 4 (four) times daily., Disp: 180 mL, Rfl: 0;  topiramate (TOPAMAX) 100 MG tablet, Take 100 mg by mouth daily.  , Disp: , Rfl:  traMADol (ULTRAM) 50 MG tablet, Take 50 mg by mouth every 6 (six) hours as needed.  , Disp: , Rfl: ;  Vitamin D, Ergocalciferol, (DRISDOL) 50000 UNITS CAPS, Take 50,000 Units by mouth., Disp: , Rfl:  (nystatin just started today. Not taking her vitamin D)  ROS:  Denies fevers, URI symptoms, cough, shortness of breath, chest pain, nausea, vomiting, bowel changes, skin rashes. Last vitamin D was 11/2011.  Admits to noncompliance with taking Vitamin D.  +chronic pain; depression/anxiety, stable per pt  PHYSICAL EXAM: BP 106/70  Pulse 60  Temp(Src) 98.4 F (36.9  C) (Oral)  Ht 5\' 6"  (1.676 m)  Wt 156 lb (70.761 kg)  BMI 25.19 kg/m2  Pleasant female, in no distress. Speaking easily in full sentences, no slurred speech OP:  White coating to tongue, and slight patch on posterior aspect of right buccal mucosa Tongue doesn't appear swollen, no other abnormalities noted Neck: no lymphadenopathy or mass  ASSESSMENT/PLAN:  Thrush - Plan: nystatin (MYCOSTATIN) 100000  UNIT/ML suspension  Discussed diagnosis, treatment, how to use.  Likely related to recent steroids. Supportive measures reviewed, including avoidance of citrus which may further exacerbate burning pain in tongue.  If pt calls with ongoing problems, rx magic mouthwash.  First, just try treating with topical antifungal.  F/u prn. To schedule med check with Dr. Susann Givens for July.   Restart Vitamin D supplements (OTC)

## 2012-10-17 NOTE — Patient Instructions (Addendum)
Thrush, Adult   Thrush is a yeast infection that develops in the mouth and throat and on the tongue. The medical term for this is oropharyngeal candidiasis, or OPC. Thrush is most common in older adults, but it can occur at any age. Thrush occurs when a yeast called candida grows out of control. Candida normally is present in small amounts in the mouth and on other mucous membranes. However, under certain circumstances, candida can grow rapidly, causing thrush. Thrush can be a recurring problem for people who have chronic illnesses or who take medications that limit the body's ability to fight infection (weakened immune system). Since these people have difficulty fighting infections, the fungus that causes thrush can spread throughout the body. This can cause life-threatening blood or organ infections.  CAUSES   Candida, the yeast that causes thrush, is normally present in small amounts in the mouth and on other mucous membranes. It usually causes no harm. However, when conditions are present that allow the yeast to grow uncontrolled, it invades surrounding tissues and becomes an infection. Thrush is most commonly caused by the yeast Candida albicans. Less often, other forms of candida can lead to thrush.  There are many types of bacteria in your mouth that normally control the growth of candida. Sometimes a new type of bacteria gets into your mouth and disrupts the balance of the germs already there. This can allow candida to overgrow. Other factors that increase your risk of developing thrush include:   An impaired ability to fight infection (weakened immune system). A normal immune system is usually strong enough to prevent candida from overgrowing.   Older adults are more likely to develop thrush because they may have weaker immune systems.   People with human immunodeficiency virus (HIV) infection have a high likelihood of developing thrush. About 90% of people with HIV develop thrush at some point during  the course of their disease.   People with diabetes are more likely to get thrush because high blood sugar levels promote overgrowth of the candida fungus.   A dry mouth (xerostomia). Dry mouth can result from overuse of mouthwashes or from certain conditions such as Sjgren's syndrome.   Pregnancy. Hormone changes during pregnancy can lead to thrush by altering the balance of bacteria in the mouth.   Poor dental care, especially in people who have false teeth.   The use of antibiotic medications. This may lead to thrush by changing the balance of bacteria in the mouth.  SYMPTOMS   Thrush can be a mild infection that causes no symptoms. If symptoms develop, they may include the following:   A burning feeling in the mouth and throat. This can occur at the start of a thrush infection.   White patches that adhere to the mouth and tongue. The tissue around the patches may be red, raw, and painful. If rubbed (during tooth brushing, for example), the patches and the tissue of the mouth may bleed easily.   A bad taste in the mouth or difficulty tasting foods.   Cottony feeling in the mouth.   Sometimes pain during eating and swallowing.  DIAGNOSIS   Your caregiver can usually diagnose thrush by exam. In addition to looking in your mouth, your caregiver will ask you questions about your health.  TREATMENT   Medications that help prevent the growth of fungi (antifungals) are the standard treatment for thrush. These medications are either applied directly to the affected area (topical) or swallowed (oral).  Mild thrush  In   adults, mild cases of thrush may clear up with simple treatment that can be done at home. This treatment usually involves using an antifungal mouth rinse or lozenges. Treatment usually lasts about 14 days.  Moderate to severe thrush   More severe thrush infections that have spread to the esophagus are treated with an oral antifungal medication. A topical antifungal medication may also be  used.   For some severe infections, a treatment period longer than 14 days may be needed.   Oral antifungal medications are almost never used during pregnancy because the fetus may be harmed. However, if a pregnant woman has a rare, severe thrush infection that has spread to her blood, oral antifungal medications may be used. In this case, the risk of harm to the mother and fetus from the severe thrush infection may be greater than the risk posed by the use of antifungal medications.  Persistent or recurrent thrush  Persistent (does not go away) or recurrent (keeps coming back) cases of thrush may:   Need to be treated twice as long as the symptoms last.   Require treatment with both oral and topical antifungal medications.   People with weakened immune systems can take an antifungal medication on a continuous basis to prevent thrush infections.  It is important to treat conditions that make you more likely to get thrush, such as diabetes, human immunodeficiency virus (HIV), or cancer.   HOME CARE INSTRUCTIONS    If you are breast-feeding, you should clean your nipples with an antifungal medication, such as nystatin (Mycostatin). Dry your nipples after breast-feeding. Applying lanolin-containing body lotion may help relieve nipple soreness.   If you wear dentures and get thrush, remove dentures before going to bed, brush them vigorously, and soak in a solution of chlorhexidine gluconate or a product such as Polident or Efferdent.   Eating plain, unflavored yogurt that contains live cultures (check the label) can also help cure thrush. Yogurt helps healthy bacteria grow in the mouth. These bacteria stop the growth of the yeast that causes thrush.   Adults can treat thrush at home with gentian violet (1%), a dye that kills bacteria and fungi. It is available without a prescription. If there is no known cause for the infection or if gentian violet does not cure the thrush, you need to see your  caregiver.  Comfort measures  Measures can be taken to reduce the discomfort of thrush:   Drink cold liquids such as water or iced tea. Eat flavored ice treats or frozen juices.   Eat foods that are easy to swallow such as gelatin, ice cream, or custard.   If the patches are painful, try drinking from a straw.   Rinse your mouth several times a day with a warm saltwater rinse. You can make the saltwater mixture with 1 tsp (5 g) of salt in 8 fl oz (0.2 L) of warm water.  PROGNOSIS    Most cases of thrush are mild and clear up with the use of an antifungal mouth rinse or lozenges. Very mild cases of thrush may clear up without medical treatment. It usually takes about 14 days of treatment with an oral antifungal medication to cure more severe thrush infections. In some cases, thrush may last several weeks even with treatment.   If thrush goes untreated and does not go away by itself, it can spread to other parts of the body.   Thrush can spread to the throat, the vagina, or the skin. It rarely spreads   COMPLICATIONS Complications related to thrush are rare in healthy people. There are several factors that can increase your risk of developing thrush. Age Older adults, especially those who have serious health problems, are more likely to develop thrush because their immune systems are likely to be weaker. Behavior  The yeast that causes thrush can be spread by oral sex.  Heavy smoking can lower the body's ability to fight off infections. This makes thrush more likely to develop. Other conditions  False teeth (dentures), braces, or a retainer that irritates the mouth make it hard to keep the mouth clean. An unclean mouth is  more likely to develop thrush than a clean mouth.  People with a weakened immune system, such as those who have diabetes or human immunodeficiency virus (HIV) or who are undergoing chemotherapy, have an increased risk for developing thrush. Medications Some medications can allow the fungus that causes thrush to grow uncontrolled. Common ones are:  Antibiotics, especially those that kill a wide range of organisms (broad-spectrum antibiotics), such as tetracycline commonly can cause thrush.  Birth control pills (oral contraceptives).  Medications that weaken the body's immune system, such as corticosteroids. Environment Exposure over time to certain environmental chemicals, such as benzene and pesticides, can weaken the body's immune system. This increases your risk for developing infections, including thrush. SEEK IMMEDIATE MEDICAL CARE IF:  Your symptoms are getting worse or are not improving within 7 days of starting treatment.  You have symptoms of spreading infection, such as white patches on the skin outside of the mouth.  You are nursing and you have redness and pain in the nipples in spite of home treatment or if you have burning pain in the nipple area when you nurse. Your baby's mouth should also be examined to determine whether thrush is causing your symptoms. Document Released: 02/01/2004 Document Revised: 07/31/2011 Document Reviewed: 05/13/2008 [facility] Patient Information 2014 [place], [place].   Stop brushing your tongue--might be inflaming it more (just temporarily) Avoid acidic foods (ie citrus) until tongue is feeling better.

## 2012-10-25 ENCOUNTER — Telehealth: Payer: Self-pay | Admitting: Family Medicine

## 2012-10-25 DIAGNOSIS — B370 Candidal stomatitis: Secondary | ICD-10-CM

## 2012-10-25 MED ORDER — NYSTATIN 100000 UNIT/ML MT SUSP: 500000.0000 [IU] | Freq: Four times a day (QID) | OROMUCOSAL | Status: DC

## 2012-10-25 NOTE — Telephone Encounter (Signed)
Refill sent.  F/u if not resolving

## 2012-10-25 NOTE — Telephone Encounter (Signed)
PT INFORMED

## 2012-10-25 NOTE — Telephone Encounter (Signed)
Pt called and stated there the medication she was given will be out tomorrow and she is still having issues with her mouth. Pt requested more medication. Pt uses [facility].

## 2012-10-28 ENCOUNTER — Telehealth: Payer: Self-pay | Admitting: Family Medicine

## 2012-10-28 NOTE — Telephone Encounter (Signed)
Called pt reached voice mail.  Left message that she needs ov to recheck.  Recheck with [employer].  [facility] primary provider.

## 2012-10-28 NOTE — Telephone Encounter (Signed)
As stated when pt called for refill last week, needs OV if not resolving.  Please schedule for OV.  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

## 2012-10-28 NOTE — Telephone Encounter (Signed)
Pt called and states her mouth is not much better.  How much longer should she take her medicine?  She thought she was getting better, but more has come back.  Please advise pt [phone]

## 2012-10-31 ENCOUNTER — Encounter: Payer: Self-pay | Admitting: Family Medicine

## 2012-10-31 ENCOUNTER — Ambulatory Visit (INDEPENDENT_AMBULATORY_CARE_PROVIDER_SITE_OTHER): Payer: Medicare Other | Admitting: Family Medicine

## 2012-10-31 VITALS — BP 116/80 | HR 50 | Wt 158.0 lb

## 2012-10-31 DIAGNOSIS — M1710 Unilateral primary osteoarthritis, unspecified knee: Secondary | ICD-10-CM | POA: Diagnosis not present

## 2012-10-31 DIAGNOSIS — R5383 Other fatigue: Secondary | ICD-10-CM | POA: Diagnosis not present

## 2012-10-31 DIAGNOSIS — M25559 Pain in unspecified hip: Secondary | ICD-10-CM | POA: Diagnosis not present

## 2012-10-31 DIAGNOSIS — E559 Vitamin D deficiency, unspecified: Secondary | ICD-10-CM | POA: Diagnosis not present

## 2012-10-31 DIAGNOSIS — G4700 Insomnia, unspecified: Secondary | ICD-10-CM | POA: Diagnosis not present

## 2012-10-31 DIAGNOSIS — IMO0001 Reserved for inherently not codable concepts without codable children: Secondary | ICD-10-CM | POA: Diagnosis not present

## 2012-10-31 DIAGNOSIS — K140 Glossitis: Secondary | ICD-10-CM | POA: Diagnosis not present

## 2012-10-31 DIAGNOSIS — R5381 Other malaise: Secondary | ICD-10-CM | POA: Diagnosis not present

## 2012-10-31 NOTE — Patient Instructions (Signed)
Use half water and half peroxide to rinse your mouth out. Do it for 1 minute and do it 3 times a day. Try to gently brushed your tongue when you brush your teeth. If you're still having trouble next week,call and we will refer you

## 2012-10-31 NOTE — Progress Notes (Signed)
  Subjective:    Patient ID: Gina Espinoza, female    DOB: [birth year], 52 y.o.   MRN: [id number]  HPI She is here for recheck. She continues to have difficulty with tongue discomfort. She describes it as a burning and stinging sensation that bothers her especially when she brushes her teeth. She has been treated with an antifungal medication has noted no improvement.   Review of Systems     Objective:   Physical Exam Alert and in no distress. Exam of the mouth does show a whitish coating on the tongue however the rest of the mucosa is normal. Neck is supple without adenopathy. KOH was negative.       Assessment & Plan:  Glossitis I will treat her with half water and peroxide 3 times per day. If she does not improve with this, I will  Refer either to ENTor an oral surgeon.

## 2012-11-01 ENCOUNTER — Encounter (HOSPITAL_COMMUNITY): Payer: Self-pay | Admitting: Emergency Medicine

## 2012-11-01 ENCOUNTER — Emergency Department (HOSPITAL_COMMUNITY)
Admission: EM | Admit: 2012-11-01 | Discharge: 2012-11-01 | Disposition: A | Discharge status: Home / Self Care | Payer: Medicare Other | Attending: Emergency Medicine | Admitting: Emergency Medicine

## 2012-11-01 DIAGNOSIS — IMO0001 Reserved for inherently not codable concepts without codable children: Secondary | ICD-10-CM | POA: Insufficient documentation

## 2012-11-01 DIAGNOSIS — T6391XA Toxic effect of contact with unspecified venomous animal, accidental (unintentional), initial encounter: Secondary | ICD-10-CM | POA: Diagnosis not present

## 2012-11-01 DIAGNOSIS — Z885 Allergy status to narcotic agent status: Secondary | ICD-10-CM | POA: Insufficient documentation

## 2012-11-01 DIAGNOSIS — H5710 Ocular pain, unspecified eye: Secondary | ICD-10-CM | POA: Diagnosis not present

## 2012-11-01 DIAGNOSIS — Z79899 Other long term (current) drug therapy: Secondary | ICD-10-CM | POA: Diagnosis not present

## 2012-11-01 DIAGNOSIS — I10 Essential (primary) hypertension: Secondary | ICD-10-CM | POA: Insufficient documentation

## 2012-11-01 DIAGNOSIS — H538 Other visual disturbances: Secondary | ICD-10-CM | POA: Diagnosis not present

## 2012-11-01 DIAGNOSIS — F411 Generalized anxiety disorder: Secondary | ICD-10-CM | POA: Diagnosis not present

## 2012-11-01 DIAGNOSIS — Y999 Unspecified external cause status: Secondary | ICD-10-CM | POA: Insufficient documentation

## 2012-11-01 DIAGNOSIS — F329 Major depressive disorder, single episode, unspecified: Secondary | ICD-10-CM | POA: Diagnosis not present

## 2012-11-01 DIAGNOSIS — Z888 Allergy status to other drugs, medicaments and biological substances status: Secondary | ICD-10-CM | POA: Diagnosis not present

## 2012-11-01 DIAGNOSIS — T63461A Toxic effect of venom of wasps, accidental (unintentional), initial encounter: Secondary | ICD-10-CM | POA: Insufficient documentation

## 2012-11-01 DIAGNOSIS — Z8679 Personal history of other diseases of the circulatory system: Secondary | ICD-10-CM | POA: Diagnosis not present

## 2012-11-01 DIAGNOSIS — F3289 Other specified depressive episodes: Secondary | ICD-10-CM | POA: Insufficient documentation

## 2012-11-01 DIAGNOSIS — Y929 Unspecified place or not applicable: Secondary | ICD-10-CM | POA: Insufficient documentation

## 2012-11-01 DIAGNOSIS — E785 Hyperlipidemia, unspecified: Secondary | ICD-10-CM | POA: Diagnosis not present

## 2012-11-01 MED ORDER — FLUORESCEIN SODIUM 1 MG OP STRP
1.0000 | ORAL_STRIP | Freq: Once | OPHTHALMIC | Status: AC
  Administered 2012-11-01: 21:00:00 via OPHTHALMIC
  Filled 2012-11-01: qty 1

## 2012-11-01 MED ORDER — TETRACAINE HCL 0.5 % OP SOLN
2.0000 [drp] | Freq: Once | OPHTHALMIC | Status: AC
  Administered 2012-11-01: 2 [drp] via OPHTHALMIC
  Filled 2012-11-01: qty 2

## 2012-11-01 NOTE — ED Notes (Signed)
Pt stung in right eye around 4pm this afternoon. Reports photophobia, blurred vision, and drainage from the eye. Rates pain 10/10 at this time.

## 2012-11-01 NOTE — ED Provider Notes (Signed)
History    This chart was scribed for non-physician practitioner Magnus Sinning, PA-C working with Doug Sou, MD by Toya Smothers, ED Scribe. This patient was seen in room TR04C/TR04C and the patient's care was started at 7:56 PM.  CSN: [id number]  Arrival date & time 11/01/12  1813   First MD Initiated Contact with Patient 11/01/12 1824      Chief Complaint  Patient presents with  . Eye Injury    Patient is a 52 y.o. female presenting with eye injury. The history is provided by the patient. No language interpreter was used.  Eye Injury Pertinent negatives include no shortness of breath.  Eye Injury    HPI Comments: Gina Espinoza is a 52 y.o. female who presents to the Emergency Department complaining of 3 hours of new, sudden onset, pain of the right upper eye lid. Pain is moderate, sore, and worse with palpation. Pt states that she was stung on the eyelid by a bee. Presence of foreign body is unknown. Pt endorses associated mild blurred vision initially, which has since resolved.  She has also had some mild swelling of the upper eyelid.  Denies purulent drainage, rash, and difficulty breathing. No swelling of lips, tongue, or throat. Symptoms have not been treated PTA. Pt denies headache, diaphoresis, fever, chills, nausea, vomiting, diarrhea, weakness, cough, SOB and any other pain. Pt denies use of tobacco, alcohol, and illicit drug use.    Past Medical History  Diagnosis Date  . Personal history of unspecified circulatory disease   . Chest pain, unspecified   . Unspecified essential hypertension   . Other and unspecified hyperlipidemia     mixed  . Headache(784.0)   . Cough   . Fibromyalgia   . Depression   . Anxiety     Past Surgical History  Procedure Laterality Date  . Hematoma evacuation      vaginal hematoma 05/23/02  . Anterior perineoplasty      posterior repair. 05/16/02    Family History  Problem Relation Age of Onset  . Allergies Mother     also  children  . Asthma Daughter   . Heart disease Father   . Breast cancer Maternal Grandmother   . Colon cancer Brother   . Asthma Grandchild     History  Substance Use Topics  . Smoking status: Never Smoker   . Smokeless tobacco: Not on file  . Alcohol Use: No    Review of Systems  HENT:       Swelling of the right upper eyelid  Eyes: Positive for pain. Negative for photophobia, discharge and redness.  Respiratory: Negative for shortness of breath.   All other systems reviewed and are negative.    Allergies  Savella; Codeine; and Ivp dye  Home Medications   Current Outpatient Rx  Name  Route  Sig  Dispense  Refill  . ALPRAZolam (XANAX) 0.5 MG tablet   Oral   Take 1 mg by mouth daily.          Marland Kitchen alprazolam (XANAX) 2 MG tablet   Oral   Take 2 mg by mouth at bedtime as needed for sleep (as needed).         Marland Kitchen amLODipine (NORVASC) 2.5 MG tablet   Oral   Take 1 tablet (2.5 mg total) by mouth daily.   30 tablet   11   . cephALEXin (KEFLEX) 500 MG capsule   Oral   Take 500 mg by mouth 4 (four) times  daily.         . cholecalciferol (VITAMIN D) 400 UNITS TABS   Oral   Take 1,000 Units by mouth 2 (two) times daily.          . diazepam (VALIUM) 10 MG tablet   Oral   Take 10 mg by mouth at bedtime as needed. For sleep         . dimenhyDRINATE (DRAMAMINE) 50 MG tablet   Oral   Take 50 mg by mouth every 8 (eight) hours as needed. For motion sickness         . diphenhydrAMINE (BENADRYL) 25 mg capsule   Oral   Take 25 mg by mouth every 6 (six) hours as needed for allergies.          Marland Kitchen escitalopram (LEXAPRO) 20 MG tablet   Oral   Take 20 mg by mouth daily.         . ferrous sulfate 325 (65 FE) MG tablet   Oral   Take 325 mg by mouth daily with breakfast.           . fluconazole (DIFLUCAN) 150 MG tablet   Oral   Take 150 mg by mouth once.         . furosemide (LASIX) 20 MG tablet   Oral   Take 20 mg by mouth daily as needed. For fluid          . gabapentin (NEURONTIN) 100 MG capsule   Oral   Take 100 mg by mouth 3 (three) times daily.         Marland Kitchen HYDROcodone-acetaminophen (NORCO) 5-325 MG per tablet   Oral   Take 1 tablet by mouth every 6 (six) hours as needed for pain.          Marland Kitchen HYDROmorphone (DILAUDID) 2 MG tablet   Oral   Take 2 mg by mouth every 4 (four) hours as needed for pain.         Marland Kitchen ketorolac (TORADOL) 10 MG tablet   Oral   Take 20 mg by mouth every 6 (six) hours as needed.           . lidocaine (LIDODERM) 5 %   Transdermal   Place 1 patch onto the skin daily. Remove & Discard patch within 12 hours or as directed by MD//3 patches at a time, 12 hours on and 12 hours off          . lidocaine (XYLOCAINE) 2 % solution      10 cc swish and spit QID   150 mL   0   . loratadine (CLARITIN) 10 MG tablet   Oral   Take 10 mg by mouth as needed for allergies.          . metaxalone (SKELAXIN) 800 MG tablet   Oral   Take 800 mg by mouth 3 (three) times daily.         . methocarbamol (ROBAXIN) 500 MG tablet   Oral   Take 500 mg by mouth 3 (three) times daily.         Marland Kitchen morphine (MS CONTIN) 15 MG 12 hr tablet   Oral   Take 15 mg by mouth 2 (two) times daily.         Marland Kitchen morphine (MSIR) 15 MG tablet   Oral   Take 15 mg by mouth every 4 (four) hours as needed for pain.         . nitroGLYCERIN (NITROSTAT) 0.4 MG  SL tablet   Sublingual   Place 1 tablet (0.4 mg total) under the tongue every 5 (five) minutes as needed. Place 1 tablet under tongue for chest pains every 5 minutes up to 3 doses in 15 minutes. If pain persist call 911   25 tablet   11   . nystatin (MYCOSTATIN) 100000 UNIT/ML suspension   Oral   Take 5 mLs (500,000 Units total) by mouth 4 (four) times daily.   180 mL   0   . olopatadine (PATANOL) 0.1 % ophthalmic solution   Both Eyes   Place 1 drop into both eyes 2 (two) times daily.          Marland Kitchen oxyCODONE-acetaminophen (PERCOCET) 5-325 MG per tablet   Oral   Take 1  tablet by mouth as directed.         . promethazine (PHENERGAN) 25 MG tablet   Oral   Take 25 mg by mouth every 6 (six) hours as needed. For nausea         . rosuvastatin (CRESTOR) 5 MG tablet   Oral   Take 1 tablet (5 mg total) by mouth daily.   30 tablet   11   . traMADol (ULTRAM) 50 MG tablet   Oral   Take 50 mg by mouth every 6 (six) hours as needed.           . valACYclovir (VALTREX) 500 MG tablet   Oral   Take 500 mg by mouth daily as needed.          . Vitamin D, Ergocalciferol, (DRISDOL) 50000 UNITS CAPS   Oral   Take 50,000 Units by mouth every 7 (seven) days.            BP 128/83  Pulse 59  Temp(Src) 98.4 F (36.9 C) (Oral)  Resp 12  SpO2 96%  Physical Exam  Nursing note and vitals reviewed. Constitutional: She is oriented to person, place, and time. She appears well-developed and well-nourished.  HENT:  Head: Normocephalic.  Eyes: Conjunctivae and EOM are normal. Pupils are equal, round, and reactive to light. No foreign bodies found. Right eye exhibits no discharge and no exudate. No foreign body present in the right eye. Right conjunctiva is not injected. Right conjunctiva has no hemorrhage. No scleral icterus.  Slit lamp exam:      The right eye shows no corneal abrasion and no foreign body.  Mild erythema of the R upper eyelid No edema No foreign body present  Neck: No tracheal deviation present.  Cardiovascular: Normal rate, regular rhythm and normal heart sounds.   No murmur heard. Pulmonary/Chest: Effort normal and breath sounds normal.  Musculoskeletal: Normal range of motion.  Neurological: She is alert and oriented to person, place, and time.  Skin: Skin is warm and dry.  Psychiatric: She has a normal mood and affect.    ED Course  Procedures DIAGNOSTIC STUDIES: Oxygen Saturation is 96% on room air, normal by my interpretation.    COORDINATION OF CARE: 19:50- Ordered Visual acuity screening Once 19:56- Evaluated Pt. Pt is  awake, alert, and without distress. 20:04- Patient understand and agree with initial ED impression and plan with expectations set for ED visit.     Labs Reviewed - No data to display No results found.   No diagnosis found.    MDM  Patient presenting with a bee sting to the right upper eye lid.  No swelling at this time.  No changes in vision.  Normal visual  acuity.  No foreign body visualized. Feel that the patient is stable for discharge.  Return precautions given.  I personally performed the services described in this documentation, which was scribed in my presence. The recorded information has been reviewed and is accurate.    Pascal Lux Onamia, PA-C 11/02/12 [phone]

## 2012-11-01 NOTE — ED Notes (Signed)
Pt presents to ED today with c/o bee sting to right eye. Pt states a bee got under her glasses  And stung her around the eye.

## 2012-11-03 NOTE — ED Provider Notes (Signed)
Medical screening examination/treatment/procedure(s) were performed by non-physician practitioner and as supervising physician I was immediately available for consultation/collaboration.  Doug Sou, MD 11/03/12 [phone]

## 2012-11-07 ENCOUNTER — Telehealth: Payer: Self-pay | Admitting: Internal Medicine

## 2012-11-07 NOTE — Telephone Encounter (Signed)
Refer her to an oral surgeon

## 2012-11-07 NOTE — Telephone Encounter (Signed)
PT IS AWARE OF APPT. WITH DR.JOSEPH MILLER [phone] [address] 3:20

## 2012-11-07 NOTE — Telephone Encounter (Signed)
Pt is still having stinging in the mouth when brushing teeth and then she still has white spots on tongue. She would like to be referred.

## 2012-11-12 DIAGNOSIS — Z124 Encounter for screening for malignant neoplasm of cervix: Secondary | ICD-10-CM | POA: Diagnosis not present

## 2012-11-12 DIAGNOSIS — Z1231 Encounter for screening mammogram for malignant neoplasm of breast: Secondary | ICD-10-CM | POA: Diagnosis not present

## 2012-11-12 DIAGNOSIS — Z9189 Other specified personal risk factors, not elsewhere classified: Secondary | ICD-10-CM | POA: Diagnosis not present

## 2012-11-12 DIAGNOSIS — Z13820 Encounter for screening for osteoporosis: Secondary | ICD-10-CM | POA: Diagnosis not present

## 2012-11-14 ENCOUNTER — Telehealth: Payer: Self-pay | Admitting: Family Medicine

## 2012-11-14 DIAGNOSIS — B370 Candidal stomatitis: Secondary | ICD-10-CM | POA: Diagnosis not present

## 2012-11-14 NOTE — Telephone Encounter (Signed)
fyi

## 2012-11-18 ENCOUNTER — Other Ambulatory Visit: Payer: Self-pay | Admitting: *Deleted

## 2012-11-18 MED ORDER — AMLODIPINE BESYLATE 2.5 MG PO TABS: 2.5000 mg | ORAL_TABLET | Freq: Every day | ORAL | Status: DC

## 2012-11-20 ENCOUNTER — Encounter: Payer: Self-pay | Admitting: Cardiology

## 2012-11-20 ENCOUNTER — Ambulatory Visit (INDEPENDENT_AMBULATORY_CARE_PROVIDER_SITE_OTHER): Payer: Medicare Other | Admitting: Cardiology

## 2012-11-20 VITALS — BP 114/64 | HR 75 | Ht 66.0 in | Wt 150.0 lb

## 2012-11-20 DIAGNOSIS — E785 Hyperlipidemia, unspecified: Secondary | ICD-10-CM | POA: Diagnosis not present

## 2012-11-20 DIAGNOSIS — I10 Essential (primary) hypertension: Secondary | ICD-10-CM | POA: Diagnosis not present

## 2012-11-20 DIAGNOSIS — Z8679 Personal history of other diseases of the circulatory system: Secondary | ICD-10-CM

## 2012-11-20 DIAGNOSIS — R079 Chest pain, unspecified: Secondary | ICD-10-CM | POA: Diagnosis not present

## 2012-11-20 MED ORDER — ROSUVASTATIN CALCIUM 5 MG PO TABS: 5.0000 mg | ORAL_TABLET | Freq: Every day | ORAL | Status: DC

## 2012-11-20 MED ORDER — NITROGLYCERIN 0.4 MG SL SUBL: 0.4000 mg | SUBLINGUAL_TABLET | SUBLINGUAL | Status: DC | PRN

## 2012-11-20 MED ORDER — AMLODIPINE BESYLATE 2.5 MG PO TABS: 2.5000 mg | ORAL_TABLET | Freq: Every day | ORAL | Status: DC

## 2012-11-20 NOTE — Assessment & Plan Note (Signed)
Good control. Norvasc refilled.

## 2012-11-20 NOTE — Assessment & Plan Note (Signed)
I've asked her to get fasting blood work with her primary care.

## 2012-11-20 NOTE — Assessment & Plan Note (Addendum)
We'll continue to treat her or possible coronary spasm. I have renewed her Norvasc, isosorbide, and sublingual nitroglycerin. I have not scheduled her for followup cardiology visit. I've advised her to get her blood work including her lipids with her primary care Dr Susann Givens.  I'll look further for the note once I finished seeing my patients today. She said she received a bill for this service. We will reverse this if we find proper documentation.  I found note from Jan. This morning, July 3, I called patient and will refund her copay from that visit. Discussed with Ashok Croon who will make sure this happens.

## 2012-11-20 NOTE — Progress Notes (Signed)
HPI [name] comes in today for evaluation and management of her history of possible coronary spasm and chest discomfort responsive to nitroglycerin.  She is upset today because of poor service and the way she was treated when  she came in this past winter. She walked in with chest pain. I have looked  for the note but cannot find it. She states she never saw a Dr. and only saw a nurse. She was told to go to the emergency room.   She is having no chest discomfort or increasing  chest discomfort since then.  She seems to be compliant with her medications and needs refills.    Past Medical History  Diagnosis Date  . Personal history of unspecified circulatory disease   . Chest pain, unspecified   . Unspecified essential hypertension   . Other and unspecified hyperlipidemia     mixed  . Headache(784.0)   . Cough   . Fibromyalgia   . Depression   . Anxiety     Current Outpatient Prescriptions  Medication Sig Dispense Refill  . ALPRAZolam (XANAX) 0.5 MG tablet Take 1 mg by mouth daily.       Marland Kitchen alprazolam (XANAX) 2 MG tablet Take 2 mg by mouth at bedtime as needed for sleep (as needed).      Marland Kitchen amLODipine (NORVASC) 2.5 MG tablet Take 1 tablet (2.5 mg total) by mouth daily.  30 tablet  11  . cephALEXin (KEFLEX) 500 MG capsule Take 500 mg by mouth 4 (four) times daily.      . cholecalciferol (VITAMIN D) 400 UNITS TABS Take 1,000 Units by mouth 2 (two) times daily.       . clotrimazole (MYCELEX) 10 MG troche Take 10 mg by mouth as directed.      . diazepam (VALIUM) 10 MG tablet Take 10 mg by mouth at bedtime as needed. For sleep      . dimenhyDRINATE (DRAMAMINE) 50 MG tablet Take 50 mg by mouth every 8 (eight) hours as needed. For motion sickness      . diphenhydrAMINE (BENADRYL) 25 mg capsule Take 25 mg by mouth every 6 (six) hours as needed for allergies.       Marland Kitchen escitalopram (LEXAPRO) 20 MG tablet Take 20 mg by mouth daily.      . ferrous sulfate 325 (65 FE) MG tablet Take 325 mg by  mouth daily with breakfast.        . fluconazole (DIFLUCAN) 150 MG tablet Take 150 mg by mouth once.      . furosemide (LASIX) 20 MG tablet Take 20 mg by mouth daily as needed. For fluid      . gabapentin (NEURONTIN) 100 MG capsule Take 100 mg by mouth 3 (three) times daily.      Marland Kitchen HYDROcodone-acetaminophen (NORCO) 5-325 MG per tablet Take 1 tablet by mouth every 6 (six) hours as needed for pain.       Marland Kitchen HYDROmorphone (DILAUDID) 2 MG tablet Take 2 mg by mouth every 4 (four) hours as needed for pain.      Marland Kitchen ketorolac (TORADOL) 10 MG tablet Take 20 mg by mouth every 6 (six) hours as needed.        . lidocaine (LIDODERM) 5 % Place 1 patch onto the skin daily. Remove & Discard patch within 12 hours or as directed by MD//3 patches at a time, 12 hours on and 12 hours off       . lidocaine (XYLOCAINE) 2 % solution 10  cc swish and spit QID  150 mL  0  . loratadine (CLARITIN) 10 MG tablet Take 10 mg by mouth as needed for allergies.       . metaxalone (SKELAXIN) 800 MG tablet Take 800 mg by mouth 3 (three) times daily.      . methocarbamol (ROBAXIN) 500 MG tablet Take 500 mg by mouth 3 (three) times daily.      Marland Kitchen morphine (MS CONTIN) 15 MG 12 hr tablet Take 15 mg by mouth 2 (two) times daily.      Marland Kitchen morphine (MSIR) 15 MG tablet Take 15 mg by mouth every 4 (four) hours as needed for pain.      . nitroGLYCERIN (NITROSTAT) 0.4 MG SL tablet Place 1 tablet (0.4 mg total) under the tongue every 5 (five) minutes as needed. Place 1 tablet under tongue for chest pains every 5 minutes up to 3 doses in 15 minutes. If pain persist call 911  25 tablet  prn  . nystatin (MYCOSTATIN) 100000 UNIT/ML suspension Take 5 mLs (500,000 Units total) by mouth 4 (four) times daily.  180 mL  0  . olopatadine (PATANOL) 0.1 % ophthalmic solution Place 1 drop into both eyes 2 (two) times daily.       Marland Kitchen oxyCODONE-acetaminophen (PERCOCET) 5-325 MG per tablet Take 1 tablet by mouth as directed.      . promethazine (PHENERGAN) 25 MG tablet  Take 25 mg by mouth every 6 (six) hours as needed. For nausea      . rosuvastatin (CRESTOR) 5 MG tablet Take 1 tablet (5 mg total) by mouth daily.  30 tablet  11  . temazepam (RESTORIL) 15 MG capsule Take 15 mg by mouth at bedtime as needed for sleep.      Marland Kitchen tiZANidine (ZANAFLEX) 4 MG tablet Take 4 mg by mouth as directed.      . traMADol (ULTRAM) 50 MG tablet Take 50 mg by mouth every 6 (six) hours as needed.        . valACYclovir (VALTREX) 500 MG tablet Take 500 mg by mouth daily as needed.       . Vitamin D, Ergocalciferol, (DRISDOL) 50000 UNITS CAPS Take 50,000 Units by mouth every 7 (seven) days.        No current facility-administered medications for this visit.    Allergies  Allergen Reactions  . Savella (Milnacipran Hcl) Anaphylaxis  . Bee Venom   . Codeine   . Ivp Dye (Iodinated Diagnostic Agents)     A fibromyalgia medicine--cannot state name     Family History  Problem Relation Age of Onset  . Allergies Mother     also children  . Asthma Daughter   . Heart disease Father   . Breast cancer Maternal Grandmother   . Colon cancer Brother   . Asthma Grandchild     History   Social History  . Marital Status: Single    Spouse Name: N/A    Number of Children: N/A  . Years of Education: N/A   Occupational History  . Not on file.   Social History Main Topics  . Smoking status: Never Smoker   . Smokeless tobacco: Not on file  . Alcohol Use: No  . Drug Use: No  . Sexually Active: Not Currently   Other Topics Concern  . Not on file   Social History Narrative   Single, children, works as a Midwife.  On disability (fibromyalgia, heart, depression/anxiety)    ROS ALL NEGATIVE EXCEPT  THOSE NOTED IN HPI  PE  General Appearance: well developed, well nourished in no acute distress HEENT: symmetrical face, PERRLA, good dentition  Neck: no JVD, thyromegaly, or adenopathy, trachea midline Chest: symmetric without deformity Cardiac: PMI non-displaced, RRR, normal  S1, S2, no gallop or murmur Lung: clear to ausculation and percussion Vascular: all pulses full without bruits  Abdominal: nondistended, nontender, good bowel sounds, no HSM, no bruits Extremities: no cyanosis, clubbing or edema, no sign of DVT, no varicosities  Skin: normal color, no rashes Neuro: alert and oriented x 3, non-focal Pysch: normal affect  EKG Not repeated today. BMET    Component Value Date/Time   NA 140 12/17/2008 0901   K 4.2 12/17/2008 0901   CL 103 12/17/2008 0901   CO2 30 12/17/2008 0901   GLUCOSE 94 12/17/2008 0901   BUN 13 12/17/2008 0901   CREATININE 0.9 12/17/2008 0901   CALCIUM 9.3 12/17/2008 0901   GFRNONAA 85.68 12/17/2008 0901   GFRAA 86 08/22/2007 1128    Lipid Panel     Component Value Date/Time   CHOL 130 11/17/2010 1307   TRIG 40.0 11/17/2010 1307   HDL 49.60 11/17/2010 1307   CHOLHDL 3 11/17/2010 1307   VLDL 8.0 11/17/2010 1307   LDLCALC 72 11/17/2010 1307    CBC No results found for this basename: wbc, rbc, hgb, hct, plt, mcv, mch, mchc, rdw, neutrabs, lymphsabs, monoabs, eosabs, basosabs

## 2012-11-20 NOTE — Patient Instructions (Addendum)
Your physician recommends that you continue on your current medications as directed. Please refer to the Current Medication list given to you today.  Follow up as needed  

## 2012-11-26 DIAGNOSIS — B370 Candidal stomatitis: Secondary | ICD-10-CM | POA: Diagnosis not present

## 2012-12-16 ENCOUNTER — Other Ambulatory Visit: Payer: Self-pay | Admitting: *Deleted

## 2012-12-16 DIAGNOSIS — I10 Essential (primary) hypertension: Secondary | ICD-10-CM

## 2012-12-16 MED ORDER — AMLODIPINE BESYLATE 2.5 MG PO TABS: 2.5000 mg | ORAL_TABLET | Freq: Every day | ORAL | Status: DC

## 2012-12-16 NOTE — Telephone Encounter (Signed)
refill sent for 90 day supply

## 2012-12-16 NOTE — Telephone Encounter (Signed)
Refill sent again first time it just printed out.

## 2012-12-17 ENCOUNTER — Ambulatory Visit (INDEPENDENT_AMBULATORY_CARE_PROVIDER_SITE_OTHER): Payer: Medicare Other | Admitting: Family Medicine

## 2012-12-17 ENCOUNTER — Encounter: Payer: Self-pay | Admitting: Family Medicine

## 2012-12-17 VITALS — BP 116/70 | HR 60 | Ht 66.0 in | Wt 148.0 lb

## 2012-12-17 DIAGNOSIS — F329 Major depressive disorder, single episode, unspecified: Secondary | ICD-10-CM | POA: Diagnosis not present

## 2012-12-17 DIAGNOSIS — Z8679 Personal history of other diseases of the circulatory system: Secondary | ICD-10-CM

## 2012-12-17 DIAGNOSIS — F32A Depression, unspecified: Secondary | ICD-10-CM

## 2012-12-17 DIAGNOSIS — IMO0001 Reserved for inherently not codable concepts without codable children: Secondary | ICD-10-CM | POA: Diagnosis not present

## 2012-12-17 DIAGNOSIS — F3289 Other specified depressive episodes: Secondary | ICD-10-CM

## 2012-12-17 DIAGNOSIS — I10 Essential (primary) hypertension: Secondary | ICD-10-CM | POA: Diagnosis not present

## 2012-12-17 DIAGNOSIS — M797 Fibromyalgia: Secondary | ICD-10-CM

## 2012-12-17 DIAGNOSIS — E785 Hyperlipidemia, unspecified: Secondary | ICD-10-CM

## 2012-12-17 NOTE — Progress Notes (Signed)
  Subjective:    Patient ID: Gina Espinoza, female    DOB: Nov 25, 1960, 52 y.o.   MRN: [id number] veshwar  HPI She is here for a followup annual wellness visit. She states that she thinks her health is not in good shape. She does have underlying depression and has been followed by her gynecologist for this. Her gynecologist has tried refer her to psychiatry but she is having difficulty finding one that takes Medicare. Her fibromyalgia is being followed by Dr. Corliss Skains. She also sees Dr. Daleen Squibb however he apparently is going to turn over her blood pressure and cholesterol care to me and use cardiology on an as-needed basis. She also has seen an orthopedic surgeon in the past. Her present medical condition does interfere with ADLs. She does get occasional help from friends and family when her fibromyalgia and arthritic symptoms cost too much difficulty. Health maintenance was reviewed. She is up-to-date on all of her routine health maintenance issues. She does not smoke or drink. She does use assistive devices on her knee to help with ambulation.   Review of Systems     Objective:   Physical Exam Alert and in no distress otherwise not examined      Assessment & Plan:  Depression  Fibromyalgia syndrome  HYPERTENSION, UNSPECIFIED  HYPERLIPIDEMIA-MIXED  ANGINA, HX OF  I reviewed all the appropriate parameters for the annual wellness visit with her. Explained that exam is not done as part of this. She voiced understanding.

## 2012-12-31 ENCOUNTER — Encounter: Payer: Self-pay | Admitting: Family Medicine

## 2013-01-23 ENCOUNTER — Ambulatory Visit (INDEPENDENT_AMBULATORY_CARE_PROVIDER_SITE_OTHER): Payer: Medicare Other | Admitting: Family Medicine

## 2013-01-23 ENCOUNTER — Encounter: Payer: Self-pay | Admitting: Family Medicine

## 2013-01-23 VITALS — BP 100/70 | HR 60 | Wt 134.0 lb

## 2013-01-23 DIAGNOSIS — K5900 Constipation, unspecified: Secondary | ICD-10-CM

## 2013-01-23 DIAGNOSIS — R634 Abnormal weight loss: Secondary | ICD-10-CM | POA: Diagnosis not present

## 2013-01-23 DIAGNOSIS — R109 Unspecified abdominal pain: Secondary | ICD-10-CM | POA: Diagnosis not present

## 2013-01-23 DIAGNOSIS — K5909 Other constipation: Secondary | ICD-10-CM

## 2013-01-23 LAB — COMPREHENSIVE METABOLIC PANEL
Albumin: 4.5 g/dL (ref 3.5–5.2)
Alkaline Phosphatase: 79 U/L (ref 39–117)
BUN: 18 mg/dL (ref 6–23)
Calcium: 9.8 mg/dL (ref 8.4–10.5)
Chloride: 101 mEq/L (ref 96–112)
Glucose, Bld: 79 mg/dL (ref 70–99)
Potassium: 3.5 mEq/L (ref 3.5–5.3)

## 2013-01-23 LAB — HEMOCCULT GUIAC POC 1CARD (OFFICE)

## 2013-01-23 MED ORDER — HYOSCYAMINE SULFATE ER 0.375 MG PO TB12: 0.3750 mg | ORAL_TABLET | Freq: Two times a day (BID) | ORAL | Status: DC | PRN

## 2013-01-23 NOTE — Progress Notes (Signed)
  Subjective:    Patient ID: Gina Espinoza, female    DOB: 11/19/1960, 52 y.o.   MRN: [id number]  HPI She is here for evaluation of a several month history of abdominal pain with eating. She keep anything and shortly thereafter will get pain in the last 30-45 minutes. She also has a history of difficulty with worsening constipation for the last 6 or 7 months. She states the only time she has a bowel movement this with a laxative.  She states she has had difficulty with constipation for well over 10 years and in the past and had an endoscopy as well as colonoscopy. She also complains of a 7 week history of left upper quadrant intermittent cramping type pain. She has not been taking her iron supplements.  Review of Systems     Objective:   Physical Exam alert and in no distress. Tympanic membranes and canals are normal. Throat is clear. Tonsils are normal. Neck is supple without adenopathy or thyromegaly. Cardiac exam shows a regular sinus rhythm without murmurs or gallops. Lungs are clear to auscultation. Abdominal exam shows decreased bowel sounds without masses or tenderness. Rectal exam showed no stool in the vault and was guaiac negative.        Assessment & Plan:  Loss of weight - Plan: CBC with Differential, Comprehensive metabolic panel, Hemoccult - 1 Card (office)  Abdominal  pain, other specified site - Plan: hyoscyamine (LEVBID) 0.375 MG 12 hr tablet  Chronic constipation  I will have her try Levbid on a temporary basis. I will probably make a GI referral pending lab results.

## 2013-01-24 ENCOUNTER — Other Ambulatory Visit: Payer: Self-pay

## 2013-01-24 ENCOUNTER — Encounter: Payer: Self-pay | Admitting: Internal Medicine

## 2013-01-24 ENCOUNTER — Telehealth: Payer: Self-pay

## 2013-01-24 DIAGNOSIS — R109 Unspecified abdominal pain: Secondary | ICD-10-CM

## 2013-01-24 DIAGNOSIS — R634 Abnormal weight loss: Secondary | ICD-10-CM

## 2013-01-24 DIAGNOSIS — K5900 Constipation, unspecified: Secondary | ICD-10-CM

## 2013-01-24 LAB — CBC WITH DIFFERENTIAL/PLATELET
Basophils Relative: 1 % (ref 0–1)
HCT: 36.2 % (ref 36.0–46.0)
Hemoglobin: 11.6 g/dL — ABNORMAL LOW (ref 12.0–15.0)
MCH: 25.2 pg — ABNORMAL LOW (ref 26.0–34.0)
MCHC: 32 g/dL (ref 30.0–36.0)
MCV: 78.7 fL (ref 78.0–100.0)
Monocytes Absolute: 0.3 10*3/uL (ref 0.1–1.0)
Monocytes Relative: 8 % (ref 3–12)
Neutro Abs: 1.1 10*3/uL — ABNORMAL LOW (ref 1.7–7.7)

## 2013-01-24 MED ORDER — DICYCLOMINE HCL 10 MG PO CAPS: 10.0000 mg | ORAL_CAPSULE | Freq: Three times a day (TID) | ORAL | Status: DC

## 2013-01-24 NOTE — Telephone Encounter (Signed)
PATIENT CALLED AND SAID THE LEVIBD IS NOT COVERED BY HER INS. IS THERE ANYTHING ELSE YOU COULD GIVE HER

## 2013-01-24 NOTE — Telephone Encounter (Signed)
Use Bentyl or Levsin

## 2013-01-24 NOTE — Telephone Encounter (Signed)
Call pharmacy and see anything equivalent to that is covered better.

## 2013-01-24 NOTE — Progress Notes (Signed)
Quick Note:  PT INFORMED ORDER PUT IN FOR GI ______

## 2013-01-24 NOTE — Telephone Encounter (Signed)
SENT I N BENTYL BECAUSE INS. WOULD NOT COVER LEVBID PER JCL

## 2013-01-24 NOTE — Telephone Encounter (Signed)
THE PHARMACIST SAID THEY WOULD NOT KNOW IF HER INS. WOULD COVER WITHOUT PROCESSING IT SHE SAID BENTYL IS SIMILAR AND FOR WHAT YOU ARE TREATING

## 2013-01-27 ENCOUNTER — Telehealth: Payer: Self-pay | Admitting: Family Medicine

## 2013-01-27 NOTE — Telephone Encounter (Signed)
PT IS ALL SQUARED AWAY HER MED THAT IS ONLY 2 WILL BE IN THIS AFTERNOON

## 2013-01-27 NOTE — Telephone Encounter (Signed)
[phone] called and states pt cannot afford the Hyoscyamine at $29.00 as it is too expensive.  Pharmacist said she didn't know of anything cheaper.  Please advise.

## 2013-02-07 ENCOUNTER — Emergency Department (HOSPITAL_COMMUNITY): Payer: Medicare Other

## 2013-02-07 ENCOUNTER — Encounter (HOSPITAL_COMMUNITY): Payer: Self-pay | Admitting: Emergency Medicine

## 2013-02-07 ENCOUNTER — Inpatient Hospital Stay (HOSPITAL_COMMUNITY)
Admission: EM | Admit: 2013-02-07 | Discharge: 2013-02-12 | DRG: 640 | Disposition: A | Discharge status: Skilled Nursing Facility | Payer: Medicare Other | Attending: Internal Medicine | Admitting: Internal Medicine

## 2013-02-07 DIAGNOSIS — F329 Major depressive disorder, single episode, unspecified: Secondary | ICD-10-CM | POA: Diagnosis not present

## 2013-02-07 DIAGNOSIS — R079 Chest pain, unspecified: Secondary | ICD-10-CM | POA: Diagnosis not present

## 2013-02-07 DIAGNOSIS — G4700 Insomnia, unspecified: Secondary | ICD-10-CM | POA: Diagnosis present

## 2013-02-07 DIAGNOSIS — E876 Hypokalemia: Secondary | ICD-10-CM | POA: Diagnosis not present

## 2013-02-07 DIAGNOSIS — I498 Other specified cardiac arrhythmias: Secondary | ICD-10-CM | POA: Diagnosis not present

## 2013-02-07 DIAGNOSIS — R634 Abnormal weight loss: Secondary | ICD-10-CM | POA: Diagnosis present

## 2013-02-07 DIAGNOSIS — IMO0001 Reserved for inherently not codable concepts without codable children: Secondary | ICD-10-CM

## 2013-02-07 DIAGNOSIS — G894 Chronic pain syndrome: Secondary | ICD-10-CM | POA: Diagnosis present

## 2013-02-07 DIAGNOSIS — R339 Retention of urine, unspecified: Secondary | ICD-10-CM | POA: Diagnosis not present

## 2013-02-07 DIAGNOSIS — E860 Dehydration: Secondary | ICD-10-CM | POA: Diagnosis not present

## 2013-02-07 DIAGNOSIS — R109 Unspecified abdominal pain: Secondary | ICD-10-CM | POA: Diagnosis not present

## 2013-02-07 DIAGNOSIS — E43 Unspecified severe protein-calorie malnutrition: Secondary | ICD-10-CM | POA: Insufficient documentation

## 2013-02-07 DIAGNOSIS — E785 Hyperlipidemia, unspecified: Secondary | ICD-10-CM | POA: Diagnosis present

## 2013-02-07 DIAGNOSIS — R630 Anorexia: Principal | ICD-10-CM | POA: Diagnosis present

## 2013-02-07 DIAGNOSIS — D509 Iron deficiency anemia, unspecified: Secondary | ICD-10-CM | POA: Diagnosis present

## 2013-02-07 DIAGNOSIS — K5900 Constipation, unspecified: Secondary | ICD-10-CM

## 2013-02-07 DIAGNOSIS — K2990 Gastroduodenitis, unspecified, without bleeding: Secondary | ICD-10-CM

## 2013-02-07 DIAGNOSIS — K2970 Gastritis, unspecified, without bleeding: Secondary | ICD-10-CM

## 2013-02-07 DIAGNOSIS — R05 Cough: Secondary | ICD-10-CM

## 2013-02-07 DIAGNOSIS — F32A Depression, unspecified: Secondary | ICD-10-CM

## 2013-02-07 DIAGNOSIS — IMO0002 Reserved for concepts with insufficient information to code with codable children: Secondary | ICD-10-CM

## 2013-02-07 DIAGNOSIS — R51 Headache: Secondary | ICD-10-CM

## 2013-02-07 DIAGNOSIS — F3289 Other specified depressive episodes: Secondary | ICD-10-CM | POA: Diagnosis present

## 2013-02-07 DIAGNOSIS — Z79899 Other long term (current) drug therapy: Secondary | ICD-10-CM

## 2013-02-07 DIAGNOSIS — R110 Nausea: Secondary | ICD-10-CM | POA: Diagnosis not present

## 2013-02-07 DIAGNOSIS — R112 Nausea with vomiting, unspecified: Secondary | ICD-10-CM | POA: Diagnosis not present

## 2013-02-07 DIAGNOSIS — I10 Essential (primary) hypertension: Secondary | ICD-10-CM | POA: Diagnosis present

## 2013-02-07 DIAGNOSIS — Z8679 Personal history of other diseases of the circulatory system: Secondary | ICD-10-CM

## 2013-02-07 DIAGNOSIS — K3184 Gastroparesis: Secondary | ICD-10-CM

## 2013-02-07 DIAGNOSIS — N179 Acute kidney failure, unspecified: Secondary | ICD-10-CM | POA: Diagnosis present

## 2013-02-07 DIAGNOSIS — R059 Cough, unspecified: Secondary | ICD-10-CM

## 2013-02-07 DIAGNOSIS — M797 Fibromyalgia: Secondary | ICD-10-CM | POA: Diagnosis present

## 2013-02-07 DIAGNOSIS — R001 Bradycardia, unspecified: Secondary | ICD-10-CM

## 2013-02-07 DIAGNOSIS — N390 Urinary tract infection, site not specified: Secondary | ICD-10-CM | POA: Diagnosis present

## 2013-02-07 DIAGNOSIS — F411 Generalized anxiety disorder: Secondary | ICD-10-CM | POA: Diagnosis present

## 2013-02-07 DIAGNOSIS — F1120 Opioid dependence, uncomplicated: Secondary | ICD-10-CM | POA: Diagnosis present

## 2013-02-07 LAB — COMPREHENSIVE METABOLIC PANEL
AST: 22 U/L (ref 0–37)
Albumin: 4.2 g/dL (ref 3.5–5.2)
Alkaline Phosphatase: 94 U/L (ref 39–117)
BUN: 10 mg/dL (ref 6–23)
CO2: 25 mEq/L (ref 19–32)
Chloride: 98 mEq/L (ref 96–112)
GFR calc non Af Amer: 79 mL/min — ABNORMAL LOW (ref >90)
Potassium: 3.4 mEq/L — ABNORMAL LOW (ref 3.5–5.1)
Total Bilirubin: 0.6 mg/dL (ref 0.3–1.2)

## 2013-02-07 LAB — CREATININE, SERUM
Creatinine, Ser: 0.9 mg/dL (ref 0.50–1.10)
GFR calc Af Amer: 84 mL/min — ABNORMAL LOW (ref >90)
GFR calc non Af Amer: 72 mL/min — ABNORMAL LOW (ref >90)

## 2013-02-07 LAB — URINE MICROSCOPIC-ADD ON

## 2013-02-07 LAB — LIPASE, BLOOD: Lipase: 34 U/L (ref 11–59)

## 2013-02-07 LAB — CBC WITH DIFFERENTIAL/PLATELET
Basophils Absolute: 0 10*3/uL (ref 0.0–0.1)
Basophils Relative: 1 % (ref 0–1)
Eosinophils Absolute: 0 10*3/uL (ref 0.0–0.7)
Eosinophils Relative: 1 % (ref 0–5)
HCT: 35.3 % — ABNORMAL LOW (ref 36.0–46.0)
Hemoglobin: 11.8 g/dL — ABNORMAL LOW (ref 12.0–15.0)
Lymphocytes Relative: 43 % (ref 12–46)
Lymphs Abs: 1.5 10*3/uL (ref 0.7–4.0)
MCH: 26 pg (ref 26.0–34.0)
MCHC: 33.4 g/dL (ref 30.0–36.0)
MCV: 77.8 fL — ABNORMAL LOW (ref 78.0–100.0)
Monocytes Absolute: 0.3 10*3/uL (ref 0.1–1.0)
Monocytes Relative: 9 % (ref 3–12)
Neutro Abs: 1.6 10*3/uL — ABNORMAL LOW (ref 1.7–7.7)
Neutrophils Relative %: 47 % (ref 43–77)
Platelets: 224 10*3/uL (ref 150–400)
RBC: 4.54 MIL/uL (ref 3.87–5.11)
RDW: 14.8 % (ref 11.5–15.5)
WBC: 3.4 10*3/uL — ABNORMAL LOW (ref 4.0–10.5)

## 2013-02-07 LAB — URINALYSIS, ROUTINE W REFLEX MICROSCOPIC
Bilirubin Urine: NEGATIVE
Glucose, UA: NEGATIVE mg/dL
Ketones, ur: NEGATIVE mg/dL
Nitrite: NEGATIVE
Protein, ur: NEGATIVE mg/dL
Specific Gravity, Urine: 1.007 (ref 1.005–1.030)
Urobilinogen, UA: 0.2 mg/dL (ref 0.0–1.0)
pH: 6.5 (ref 5.0–8.0)

## 2013-02-07 LAB — POCT I-STAT TROPONIN I: Troponin i, poc: 0 ng/mL (ref 0.00–0.08)

## 2013-02-07 LAB — CBC
HCT: 34.1 % — ABNORMAL LOW (ref 36.0–46.0)
Hemoglobin: 11.2 g/dL — ABNORMAL LOW (ref 12.0–15.0)
MCH: 25.7 pg — ABNORMAL LOW (ref 26.0–34.0)
RBC: 4.36 MIL/uL (ref 3.87–5.11)
WBC: 3.3 10*3/uL — ABNORMAL LOW (ref 4.0–10.5)

## 2013-02-07 LAB — PHOSPHORUS: Phosphorus: 3.8 mg/dL (ref 2.3–4.6)

## 2013-02-07 LAB — MAGNESIUM: Magnesium: 2.1 mg/dL (ref 1.5–2.5)

## 2013-02-07 MED ORDER — ONDANSETRON HCL 4 MG/2ML IJ SOLN
4.0000 mg | Freq: Once | INTRAMUSCULAR | Status: AC
  Administered 2013-02-07: 4 mg via INTRAVENOUS
  Filled 2013-02-07: qty 2

## 2013-02-07 MED ORDER — MORPHINE SULFATE 15 MG PO TABS
15.0000 mg | ORAL_TABLET | ORAL | Status: DC | PRN
  Administered 2013-02-10: 15 mg via ORAL
  Filled 2013-02-07 (×2): qty 1

## 2013-02-07 MED ORDER — SODIUM CHLORIDE 0.9 % IJ SOLN
3.0000 mL | Freq: Two times a day (BID) | INTRAMUSCULAR | Status: DC
  Administered 2013-02-07 – 2013-02-10 (×3): 3 mL via INTRAVENOUS

## 2013-02-07 MED ORDER — DEXTROSE 5 % IV SOLN
1.0000 g | INTRAVENOUS | Status: DC
  Administered 2013-02-07 – 2013-02-09 (×3): 1 g via INTRAVENOUS
  Filled 2013-02-07 (×4): qty 10

## 2013-02-07 MED ORDER — MORPHINE SULFATE ER 15 MG PO TBCR
15.0000 mg | EXTENDED_RELEASE_TABLET | Freq: Two times a day (BID) | ORAL | Status: DC | PRN
  Administered 2013-02-12: 15 mg via ORAL
  Filled 2013-02-07: qty 1

## 2013-02-07 MED ORDER — AMLODIPINE BESYLATE 2.5 MG PO TABS
2.5000 mg | ORAL_TABLET | Freq: Every day | ORAL | Status: DC
  Administered 2013-02-08 – 2013-02-12 (×5): 2.5 mg via ORAL
  Filled 2013-02-07 (×6): qty 1

## 2013-02-07 MED ORDER — ATORVASTATIN CALCIUM 10 MG PO TABS
10.0000 mg | ORAL_TABLET | Freq: Every day | ORAL | Status: DC
  Administered 2013-02-08 – 2013-02-11 (×4): 10 mg via ORAL
  Filled 2013-02-07 (×5): qty 1

## 2013-02-07 MED ORDER — FERROUS SULFATE 325 (65 FE) MG PO TABS
325.0000 mg | ORAL_TABLET | Freq: Every day | ORAL | Status: DC
  Administered 2013-02-08 – 2013-02-09 (×2): 325 mg via ORAL
  Filled 2013-02-07 (×5): qty 1

## 2013-02-07 MED ORDER — VITAMIN D3 25 MCG (1000 UNIT) PO TABS
1000.0000 [IU] | ORAL_TABLET | Freq: Two times a day (BID) | ORAL | Status: DC
  Administered 2013-02-07 – 2013-02-12 (×10): 1000 [IU] via ORAL
  Filled 2013-02-07 (×12): qty 1

## 2013-02-07 MED ORDER — HEPARIN SODIUM (PORCINE) 5000 UNIT/ML IJ SOLN
5000.0000 [IU] | Freq: Three times a day (TID) | INTRAMUSCULAR | Status: DC
  Administered 2013-02-07 – 2013-02-12 (×15): 5000 [IU] via SUBCUTANEOUS
  Filled 2013-02-07 (×18): qty 1

## 2013-02-07 MED ORDER — LORATADINE 10 MG PO TABS
10.0000 mg | ORAL_TABLET | Freq: Every day | ORAL | Status: DC | PRN
  Filled 2013-02-07: qty 1

## 2013-02-07 MED ORDER — TEMAZEPAM 15 MG PO CAPS
30.0000 mg | ORAL_CAPSULE | Freq: Every day | ORAL | Status: DC
Start: 2013-02-07 — End: 2013-02-11
  Administered 2013-02-07 – 2013-02-10 (×4): 30 mg via ORAL
  Filled 2013-02-07 (×4): qty 2

## 2013-02-07 MED ORDER — SODIUM CHLORIDE 0.9 % IV BOLUS (SEPSIS)
1000.0000 mL | Freq: Once | INTRAVENOUS | Status: AC
  Administered 2013-02-07: 1000 mL via INTRAVENOUS

## 2013-02-07 MED ORDER — MORPHINE SULFATE 4 MG/ML IJ SOLN
4.0000 mg | Freq: Once | INTRAMUSCULAR | Status: AC
  Administered 2013-02-07: 4 mg via INTRAVENOUS
  Filled 2013-02-07: qty 1

## 2013-02-07 MED ORDER — OLOPATADINE HCL 0.1 % OP SOLN
1.0000 [drp] | Freq: Two times a day (BID) | OPHTHALMIC | Status: DC | PRN
  Filled 2013-02-07: qty 5

## 2013-02-07 MED ORDER — KCL IN DEXTROSE-NACL 20-5-0.45 MEQ/L-%-% IV SOLN
INTRAVENOUS | Status: DC
  Administered 2013-02-07 – 2013-02-08 (×2): via INTRAVENOUS
  Filled 2013-02-07 (×4): qty 1000

## 2013-02-07 MED ORDER — POTASSIUM CHLORIDE CRYS ER 20 MEQ PO TBCR: 20.0000 meq | EXTENDED_RELEASE_TABLET | Freq: Once | ORAL | Status: DC

## 2013-02-07 MED ORDER — HYDROMORPHONE HCL 2 MG PO TABS
2.0000 mg | ORAL_TABLET | ORAL | Status: DC | PRN
  Administered 2013-02-07 – 2013-02-09 (×5): 2 mg via ORAL
  Filled 2013-02-07 (×5): qty 1

## 2013-02-07 MED ORDER — LORATADINE 10 MG PO TABS: 10.0000 mg | ORAL_TABLET | ORAL | Status: DC | PRN

## 2013-02-07 MED ORDER — ENSURE PUDDING PO PUDG
1.0000 | Freq: Three times a day (TID) | ORAL | Status: DC
  Administered 2013-02-07 – 2013-02-08 (×2): 1 via ORAL

## 2013-02-07 MED ORDER — GABAPENTIN 100 MG PO CAPS
100.0000 mg | ORAL_CAPSULE | Freq: Three times a day (TID) | ORAL | Status: DC
  Administered 2013-02-09 – 2013-02-12 (×9): 100 mg via ORAL
  Filled 2013-02-07 (×16): qty 1

## 2013-02-07 MED ORDER — ONDANSETRON HCL 4 MG PO TABS: 4.0000 mg | ORAL_TABLET | Freq: Four times a day (QID) | ORAL | Status: DC | PRN

## 2013-02-07 MED ORDER — LIDOCAINE 5 % EX OINT: 1.0000 "application " | TOPICAL_OINTMENT | CUTANEOUS | Status: DC | PRN

## 2013-02-07 MED ORDER — NITROGLYCERIN 0.4 MG SL SUBL: 0.4000 mg | SUBLINGUAL_TABLET | SUBLINGUAL | Status: DC | PRN

## 2013-02-07 MED ORDER — ONDANSETRON HCL 4 MG/2ML IJ SOLN
4.0000 mg | Freq: Four times a day (QID) | INTRAMUSCULAR | Status: DC | PRN
  Administered 2013-02-07 – 2013-02-09 (×5): 4 mg via INTRAVENOUS
  Filled 2013-02-07 (×5): qty 2

## 2013-02-07 MED ORDER — ALPRAZOLAM 0.5 MG PO TABS: 0.5000 mg | ORAL_TABLET | Freq: Every day | ORAL | Status: DC | PRN

## 2013-02-07 MED ORDER — TEMAZEPAM 15 MG PO CAPS: 30.0000 mg | ORAL_CAPSULE | ORAL | Status: DC

## 2013-02-07 MED ORDER — ESCITALOPRAM OXALATE 20 MG PO TABS
20.0000 mg | ORAL_TABLET | Freq: Every day | ORAL | Status: DC
  Administered 2013-02-07 – 2013-02-12 (×6): 20 mg via ORAL
  Filled 2013-02-07 (×6): qty 1

## 2013-02-07 NOTE — ED Notes (Signed)
PT is requesting that EDP speak with Marvel Plan MD [phone] [facility]). PT states that Grewall MD has sent PT over to be admitted. PT states that she is not able to get along on her own at home and not able to perform ADL at home. Insists that she cannot eat at home and is refusing additional nutrition stating that she cannot eat due to nausea and malaise.

## 2013-02-07 NOTE — ED Notes (Signed)
Ambulated pt in the hallway per RN Hessie Diener) Oxygen saturation remained at 100% (room-air)

## 2013-02-07 NOTE — ED Notes (Signed)
CT notified PT drank contrast

## 2013-02-07 NOTE — ED Notes (Signed)
Patient transported to CT 

## 2013-02-07 NOTE — ED Provider Notes (Signed)
CSN: [id number]     Arrival date & time 02/07/13  0701 History   First MD Initiated Contact with Patient 02/07/13 [phone]     Chief Complaint  Patient presents with  . Abdominal Pain   (Consider location/radiation/quality/duration/timing/severity/associated sxs/prior Treatment) HPI This is a 52 year old female with a history of hypertension, hyperlipidemia, angina, fibromyalgia who presents with abdominal pain, nausea, vomiting, and weight loss. The patient reports a six-week history of a 34 pound weight loss has been unintentional. She endorses anorexia. She states that she has sharp epigastric pain when she. She denies any vomiting but is "always nauseated." Patient reports increasing fatigue. Last by mouth was on Monday. Patient denies any fevers, diarrhea, constipation. Patient reports that her abdominal pain usually is in her epigastrium and then she gets left-sided pain. She states that she can feel a knot in her left side that eventually goes away. On review of systems the patient endorses the chest pain and shortness of breath with exertion. She has known angina and took a nitroglycerin for this. Her last chest pain shortness of breath episode was 2 weeks ago. Patient has taken multiple medications for her pain included hydromorphone, hydrocodone, ketorolac without any relief. Patient was evaluated by her primary care doctor and was referred to a gastroenterologist. Her appointment is October. Past Medical History  Diagnosis Date  . Personal history of unspecified circulatory disease   . Chest pain, unspecified   . Unspecified essential hypertension   . Other and unspecified hyperlipidemia     mixed  . Headache(784.0)   . Cough   . Fibromyalgia   . Depression   . Anxiety    Past Surgical History  Procedure Laterality Date  . Hematoma evacuation      vaginal hematoma 05/23/02  . Anterior perineoplasty      posterior repair. 05/16/02   Family History  Problem Relation Age of Onset  .  Allergies Mother     also children  . Asthma Daughter   . Heart disease Father   . Breast cancer Maternal Grandmother   . Colon cancer Brother   . Asthma Grandchild    History  Substance Use Topics  . Smoking status: Never Smoker   . Smokeless tobacco: Not on file  . Alcohol Use: No   OB History   Grav Para Term Preterm Abortions TAB SAB Ect Mult Living                 Review of Systems  Constitutional: Positive for appetite change, fatigue and unexpected weight change. Negative for fever.  Respiratory: Positive for shortness of breath. Negative for cough and chest tightness.   Cardiovascular: Positive for chest pain.  Gastrointestinal: Positive for nausea and abdominal pain. Negative for vomiting, diarrhea, constipation and blood in stool.  Endocrine: Negative for cold intolerance and heat intolerance.  Genitourinary: Negative for dysuria.  Musculoskeletal: Negative for myalgias and back pain.  Skin: Negative for rash and wound.  Neurological: Negative for dizziness, syncope and headaches.  Psychiatric/Behavioral: The patient is not nervous/anxious.   All other systems reviewed and are negative.    Allergies  Savella; Bee venom; Codeine; and Ivp dye  Home Medications   No current outpatient prescriptions on file. BP 154/76  Pulse 54  Temp(Src) 98.4 F (36.9 C) (Oral)  Resp 16  Ht 5\' 6"  (1.676 m)  Wt 139 lb 11.2 oz (63.368 kg)  BMI 22.56 kg/m2  SpO2 98% Physical Exam  Nursing note and vitals reviewed. Constitutional: She is  oriented to person, place, and time. She appears well-developed and well-nourished. No distress.  Thin  HENT:  Head: Normocephalic and atraumatic.  Eyes: Pupils are equal, round, and reactive to light.  Neck: Neck supple.  Cardiovascular: Normal rate, regular rhythm and normal heart sounds.   No murmur heard. Pulmonary/Chest: Effort normal and breath sounds normal. No respiratory distress. She has no wheezes.  Abdominal: Soft. Bowel  sounds are normal. She exhibits no mass. There is no tenderness. There is no rebound and no guarding.  Musculoskeletal: She exhibits no edema.  Neurological: She is alert and oriented to person, place, and time.  Skin: Skin is warm and dry.  Psychiatric:  Flat    ED Course  Procedures (including critical care time) Labs Review Labs Reviewed  CBC WITH DIFFERENTIAL - Abnormal; Notable for the following:    WBC 3.4 (*)    Hemoglobin 11.8 (*)    HCT 35.3 (*)    MCV 77.8 (*)    Neutro Abs 1.6 (*)    All other components within normal limits  COMPREHENSIVE METABOLIC PANEL - Abnormal; Notable for the following:    Potassium 3.4 (*)    GFR calc non Af Amer 79 (*)    All other components within normal limits  URINALYSIS, ROUTINE W REFLEX MICROSCOPIC - Abnormal; Notable for the following:    Hgb urine dipstick TRACE (*)    Leukocytes, UA LARGE (*)    All other components within normal limits  URINE CULTURE  LIPASE, BLOOD  LACTIC ACID, PLASMA  TSH  URINE MICROSCOPIC-ADD ON  CBC  CREATININE, SERUM  BASIC METABOLIC PANEL  CBC  MAGNESIUM  PHOSPHORUS  POCT I-STAT TROPONIN I   Imaging Review Ct Abdomen Pelvis Wo Contrast  02/07/2013   CLINICAL DATA:  Abdominal pain, nausea, vomiting, weight loss  EXAM: CT ABDOMEN AND PELVIS WITHOUT CONTRAST  TECHNIQUE: Multidetector CT imaging of the abdomen and pelvis was performed following the standard protocol without intravenous contrast.  COMPARISON:  None.  FINDINGS: Study is limited without IV contrast. Sagittal images of the spine are unremarkable. Lung bases are unremarkable.  Unenhanced liver shows no biliary ductal dilatation. No calcified gallstones are noted within gallbladder.  Unenhanced pancreas, spleen and adrenal glands are unremarkable.  Unenhanced kidneys are symmetrical in size. No nephrolithiasis. No hydronephrosis or hydroureter.  Oral contrast material was given to the patient. Abdominal aorta is unremarkable. No small bowel  obstruction. No ascites or free air.  There is no pericecal inflammation.  Normal appendix is clearly visualize in axial image 53. Moderate stool noted in transverse colon. There is a calcified lymph node just anterior right common iliac vein bifurcation measures 1.3 cm by 0.8 cm. A 2nd calcified lymph node anterior to right psoas muscle at the same level measures 9 mm.  Unenhanced uterus and adnexa is unremarkable. Mild distended urinary bladder. No renal bladder filling defects are noted. Bilateral distal ureter is unremarkable. There is a calcified lymph node in left pelvic side wall measures 11 mm x 7 mm. Probable calcified lymph node right pelvic side wall measures 9 mm. No adnexal masses noted.  IMPRESSION: 1. No nephrolithiasis. No hydronephrosis or hydroureter. 2. No calcified ureteral calculi. 3. No pericecal inflammation. Normal appendix. There is a calcified lymph node just anterior to right common iliac vein measures 1.3 x 0.8 cm. Calcified lymph nodes are noted bilateral pelvic sidewall. 4. No calcified calculi are noted within urinary bladder. 5. Unremarkable uterus and adnexal. No adnexal mass.   Electronically  Signed   By: Natasha Mead   On: 02/07/2013 14:44   Dg Chest 2 View  02/07/2013   CLINICAL DATA:  Chest pain, abdominal pain, nausea  EXAM: CHEST  2 VIEW  COMPARISON:  09/13/2012  FINDINGS: The heart size and mediastinal contours are within normal limits. Both lungs are clear. The visualized skeletal structures are stable.  IMPRESSION: No active cardiopulmonary disease.   Electronically Signed   By: Natasha Mead   On: 02/07/2013 09:26   Ekg:  Sinus bradycardia with nonspecific t wave inversions in V1 and V2 MDM   1. Bradycardia   2. Dehydration   3. Chest pain, unspecified   4. Decrease in appetite   5. Depression   6. Fibromyalgia syndrome   7. Hypokalemia   8. Sinus bradycardia   9. UTI (lower urinary tract infection)    Patient presents with anorexia, nausea, abdominal pain and  wt loss.  Patient also reports anginal symptoms that come and go with last episode 2 weeks ago.  EKG reassuring and troponin neg.  Basic labwork without evidence fo hypoalbuminemia or ketones.  Patient was given fluids and IV medications.  Patient has been notably bradycardic during her ED stay - sinus brady and only endorses generalized weakness.  CT neg.  Patient ambulated but required assistance.  Patient is nonfocal.  Discussed patient with her primary GYN who has referred her to GI.  Patient also noted to have polypharmacy with multiple benzo and pain medications.  GIven bradycardia, patient will be admitted for observation and hydration.   Shon Baton, MD 02/07/13 [phone]

## 2013-02-07 NOTE — H&P (Addendum)
[facility] History and Physical  Gina Espinoza AVW:[id number] DOB: Oct 28, 1960 DOA: 02/07/2013  Referring physician: Dr. Wilkie Aye PCP: Carollee Herter, MD  Specialists: Dr. Juanito Doom  Chief Complaint: Decreased appetite and loss of weight  HPI: Gina Espinoza is a 52 y.o. 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  Presenting with complaints of decrease oral intake for the last 6 weeks.  She states that within the last week it has got worse and the problem has been persistent. Nothing she is aware of makes it better.  Not associated with abdominal discomfort or pain with eating.  Denies any diarrhea. Does complain of suprapubic tenderness but denies any dysuria  In the ED patient was found to have sinus bradycardia, leukopenia, and hypokalemia  Review of Systems: 10 point review of systems reviewed and negative unless otherwise mentioned above.  Past Medical History  Diagnosis Date  . Personal history of unspecified circulatory disease   . Chest pain, unspecified   . Unspecified essential hypertension   . Other and unspecified hyperlipidemia     mixed  . Headache(784.0)   . Cough   . Fibromyalgia   . Depression   . Anxiety    Past Surgical History  Procedure Laterality Date  . Hematoma evacuation      vaginal hematoma 05/23/02  . Anterior perineoplasty      posterior repair. 05/16/02   Social History:  reports that she has never smoked. She does not have any smokeless tobacco history on file. She reports that she does not drink alcohol or use illicit drugs.  where does patient live--home, ALF, SNF? and with whom if at home? Lives at home  Can patient participate in ADLs? yes  Allergies  Allergen Reactions  . Savella  [Milnacipran Hcl] Anaphylaxis  . Bee Venom   . Codeine   . Ivp Dye [Iodinated Diagnostic Agents]     A fibromyalgia medicine--cannot state name     Family History  Problem Relation Age of Onset  . Allergies Mother     also children  . Asthma Daughter   . Heart disease Father   . Breast cancer Maternal Grandmother   . Colon cancer Brother   . Asthma Grandchild   None other reported  Prior to Admission medications   Medication Sig Start Date End Date Taking? Authorizing Provider  ALPRAZolam Prudy Feeler) 1 MG tablet Take 1 mg by mouth daily as needed for anxiety.   Yes Historical Provider, MD  alprazolam Prudy Feeler) 2 MG tablet Take 2 mg by mouth at bedtime as needed for sleep (as needed).   Yes Historical Provider, MD  Alum & Mag Hydroxide-Simeth (MAGIC MOUTHWASH) SOLN Take 5 mLs by mouth 3 (three) times daily.   Yes Historical Provider, MD  amLODipine (NORVASC) 2.5 MG tablet Take 1 tablet (2.5 mg total) by mouth daily. 12/16/12  Yes Gaylord Shih, MD  cholecalciferol (VITAMIN D) 400 UNITS TABS Take 1,000 Units by mouth 2 (two) times daily.    Yes Historical Provider, MD  clotrimazole (MYCELEX) 10 MG troche Take 10 mg by mouth 3 (three) times daily.    Yes Historical Provider, MD  diazepam (VALIUM) 5 MG tablet Take 10 mg by mouth every 8 (eight) hours as needed for anxiety.   Yes Historical  Provider, MD  dicyclomine (BENTYL) 10 MG capsule Take 1 capsule (10 mg total) by mouth 4 (four) times daily -  before meals and at bedtime. 01/24/13  Yes Ronnald Nian, MD  dimenhyDRINATE (DRAMAMINE) 50 MG tablet Take 50 mg by mouth every 8 (eight) hours as needed. For motion sickness   Yes Historical Provider, MD  diphenhydrAMINE (BENADRYL) 25 mg capsule Take 25 mg by mouth every 6 (six) hours as needed for allergies.    Yes Historical Provider, MD  escitalopram (LEXAPRO) 20 MG tablet Take 20 mg by mouth daily.   Yes Historical Provider, MD  ferrous sulfate 325 (65 FE) MG tablet Take 325 mg by mouth daily with  breakfast.     Yes Historical Provider, MD  fluconazole (DIFLUCAN) 150 MG tablet Take 150 mg by mouth once.   Yes Historical Provider, MD  gabapentin (NEURONTIN) 100 MG capsule Take 100 mg by mouth 3 (three) times daily.   Yes Historical Provider, MD  HYDROcodone-acetaminophen (NORCO) 5-325 MG per tablet Take 1 tablet by mouth every 6 (six) hours as needed for pain.    Yes Historical Provider, MD  HYDROmorphone (DILAUDID) 2 MG tablet Take 2 mg by mouth every 4 (four) hours as needed for pain.   Yes Historical Provider, MD  hyoscyamine (LEVBID) 0.375 MG 12 hr tablet Take 1 tablet (0.375 mg total) by mouth every 12 (twelve) hours as needed for cramping. 01/23/13  Yes Ronnald Nian, MD  ketorolac (TORADOL) 10 MG tablet Take 20 mg by mouth every 6 (six) hours as needed for pain.    Yes Historical Provider, MD  lidocaine (LIDODERM) 5 % Place 1 patch onto the skin daily. Remove & Discard patch within 12 hours or as directed by MD//3 patches at a time, 12 hours on and 12 hours off    Yes Historical Provider, MD  lidocaine (XYLOCAINE) 5 % ointment Apply 1 application topically as needed.   Yes Historical Provider, MD  loratadine (CLARITIN) 10 MG tablet Take 10 mg by mouth as needed for allergies.    Yes Historical Provider, MD  metaxalone (SKELAXIN) 800 MG tablet Take 800 mg by mouth 2 (two) times daily as needed for pain.    Yes Historical Provider, MD  morphine (MS CONTIN) 15 MG 12 hr tablet Take 15 mg by mouth 2 (two) times daily as needed for pain.    Yes Historical Provider, MD  morphine (MSIR) 15 MG tablet Take 15 mg by mouth every 4 (four) hours as needed for pain.   Yes Historical Provider, MD  nitroGLYCERIN (NITROSTAT) 0.4 MG SL tablet Place 1 tablet (0.4 mg total) under the tongue every 5 (five) minutes as needed. Place 1 tablet under tongue for chest pains every 5 minutes up to 3 doses in 15 minutes. If pain persist call 911 11/20/12  Yes Gaylord Shih, MD  olopatadine (PATANOL) 0.1 % ophthalmic  solution Place 1 drop into both eyes 2 (two) times daily as needed for allergies.    Yes Historical Provider, MD  oxyCODONE-acetaminophen (PERCOCET) 5-325 MG per tablet Take 1 tablet by mouth as directed.   Yes Historical Provider, MD  promethazine (PHENERGAN) 25 MG tablet Take 25 mg by mouth every 6 (six) hours as needed. For nausea   Yes Historical Provider, MD  rosuvastatin (CRESTOR) 5 MG tablet Take 1 tablet (5 mg total) by mouth daily. 11/20/12  Yes Gaylord Shih, MD  temazepam (RESTORIL) 30 MG capsule Take 30 mg by mouth See admin  instructions. Alternates doses with Zolpidem   Yes Historical Provider, MD  tiZANidine (ZANAFLEX) 4 MG tablet Take 4 mg by mouth every 8 (eight) hours as needed (for muscle spasms).    Yes Historical Provider, MD  valACYclovir (VALTREX) 500 MG tablet Take 500 mg by mouth daily as needed.    Yes Historical Provider, MD  Xylitol (XYLIMELTS) 500 MG DISK Use as directed 1 tablet in the mouth or throat at bedtime.   Yes Historical Provider, MD  zolpidem (AMBIEN) 10 MG tablet Take 10 mg by mouth See admin instructions. Alternates doses with Temazepam   Yes Historical Provider, MD   Physical Exam: Filed Vitals:   02/07/13 1700  BP: 139/88  Pulse:   Temp:   Resp: 20     General:  Pt in NAD, Alert and AWake  Eyes: non icteric, EOMI  ENT: normal exterior appearance, dry mucous membranes  Neck: supple no goiter  Cardiovascular: S1 and S2 WNL slow rate, no murmurs  Respiratory: CTA BL, no wheezes  Abdomen: soft, NT, ND, + suprapubic discomfort  Skin: warm and dry  Musculoskeletal: no cyanosis or clubbing  Psychiatric: flat affect  Neurologic: answers question appropriately, moves all extremities  Labs on Admission:  Basic Metabolic Panel:  Recent Labs Lab 02/07/13 0841  NA 137  K 3.4*  CL 98  CO2 25  GLUCOSE 88  BUN 10  CREATININE 0.84  CALCIUM 9.7   Liver Function Tests:  Recent Labs Lab 02/07/13 0841  AST 22  ALT 17  ALKPHOS 94   BILITOT 0.6  PROT 7.7  ALBUMIN 4.2    Recent Labs Lab 02/07/13 0841  LIPASE 34   No results found for this basename: AMMONIA,  in the last 168 hours CBC:  Recent Labs Lab 02/07/13 0841  WBC 3.4*  NEUTROABS 1.6*  HGB 11.8*  HCT 35.3*  MCV 77.8*  PLT 224   Cardiac Enzymes: No results found for this basename: CKTOTAL, CKMB, CKMBINDEX, TROPONINI,  in the last 168 hours  BNP (last 3 results) No results found for this basename: PROBNP,  in the last 8760 hours CBG: No results found for this basename: GLUCAP,  in the last 168 hours  Radiological Exams on Admission: No results found.  EKG: Independently reviewed. Sinus bradycardia with no St elevation or depression  Assessment/Plan Principal Problem:   Decrease in appetite Active Problems:   HYPERLIPIDEMIA-MIXED   HYPERTENSION, UNSPECIFIED   ANGINA, HX OF   Fibromyalgia syndrome   Depression Sinus bradycardia Hypokalemia uti   1. Decrease in appetite - At this point will hold bentyl and levbid - consult dietitian - will add ensure tid between meals - monitor I/O's - suspect malnutrition  2. Sinus bradycardia - At this point suspecting that malnutrition and muscle relaxants are contributing. As such will hold muscle relaxants (watch out for withdrawal symptoms) - Will add feeding supplement - monitor on telemetry - if no improvement may consider cardiology consult  3. UTI - Could be contributing to # 1 - Rocephin per pharmacy consult - urine culture - U/A showed large leukocytes in urine as well as trace hgb  4. Fibromyalgia - continue home regimen dilaudid oral for severe pain - morphine IR and morphine 12 hour tabs. Will not cover with any IV opiods - Patient reports that opiods cause her to have nausea and as such she requires taking antiemetic prior.  Her pain regimen may be contributing to # 1 but she reports being in pain all the time.  5. Depression - continue home regimen - May also be  contributing to # 1 if not well controlled, another diagnosis to consider should patient's condition not improve.  6. Hypokalemia - replace orally. - reassess next am  7. HPL - continue statin  8. DVT prophylaxis - heparin SQ  Code Status: full Family Communication: no family at bedside.  Disposition Plan: Pending improvement in HR and oral intake. 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

## 2013-02-07 NOTE — ED Notes (Signed)
MD at bedside. Horton MD

## 2013-02-07 NOTE — ED Notes (Signed)
Pt reports sent by Dr Adalberto Ill for abdominal pain, nausea and vomiting. Pt reports rapid weight loss for about 2 months. Pt has not been able to eat since Monday due to pain.

## 2013-02-08 ENCOUNTER — Encounter (HOSPITAL_COMMUNITY): Payer: Self-pay | Admitting: Physician Assistant

## 2013-02-08 DIAGNOSIS — F329 Major depressive disorder, single episode, unspecified: Secondary | ICD-10-CM | POA: Diagnosis not present

## 2013-02-08 DIAGNOSIS — IMO0001 Reserved for inherently not codable concepts without codable children: Secondary | ICD-10-CM | POA: Diagnosis not present

## 2013-02-08 DIAGNOSIS — R109 Unspecified abdominal pain: Secondary | ICD-10-CM | POA: Diagnosis not present

## 2013-02-08 DIAGNOSIS — R630 Anorexia: Secondary | ICD-10-CM | POA: Diagnosis not present

## 2013-02-08 DIAGNOSIS — I498 Other specified cardiac arrhythmias: Secondary | ICD-10-CM | POA: Diagnosis not present

## 2013-02-08 LAB — FERRITIN: Ferritin: 132 ng/mL (ref 10–291)

## 2013-02-08 LAB — BASIC METABOLIC PANEL
Creatinine, Ser: 0.89 mg/dL (ref 0.50–1.10)
GFR calc Af Amer: 85 mL/min — ABNORMAL LOW (ref >90)
GFR calc non Af Amer: 73 mL/min — ABNORMAL LOW (ref >90)
Potassium: 3.7 mEq/L (ref 3.5–5.1)
Sodium: 139 mEq/L (ref 135–145)

## 2013-02-08 LAB — CBC
Hemoglobin: 10.5 g/dL — ABNORMAL LOW (ref 12.0–15.0)
MCHC: 32.7 g/dL (ref 30.0–36.0)
MCV: 78.9 fL (ref 78.0–100.0)
RBC: 4.07 MIL/uL (ref 3.87–5.11)
WBC: 2.9 10*3/uL — ABNORMAL LOW (ref 4.0–10.5)

## 2013-02-08 LAB — IRON AND TIBC
Saturation Ratios: 31 % (ref 20–55)
TIBC: 268 ug/dL (ref 250–470)

## 2013-02-08 LAB — RETICULOCYTES
RBC.: 4.34 MIL/uL (ref 3.87–5.11)
Retic Ct Pct: 1 % (ref 0.4–3.1)

## 2013-02-08 LAB — FOLATE: Folate: 7.2 ng/mL

## 2013-02-08 MED ORDER — BISACODYL 10 MG RE SUPP
10.0000 mg | Freq: Once | RECTAL | Status: AC
  Administered 2013-02-08: 10 mg via RECTAL
  Filled 2013-02-08: qty 1

## 2013-02-08 MED ORDER — SENNOSIDES-DOCUSATE SODIUM 8.6-50 MG PO TABS
1.0000 | ORAL_TABLET | Freq: Two times a day (BID) | ORAL | Status: DC
  Administered 2013-02-08 – 2013-02-12 (×9): 1 via ORAL
  Filled 2013-02-08 (×8): qty 1

## 2013-02-08 MED ORDER — PANTOPRAZOLE SODIUM 40 MG PO TBEC
40.0000 mg | DELAYED_RELEASE_TABLET | Freq: Every day | ORAL | Status: DC
  Administered 2013-02-08 – 2013-02-12 (×4): 40 mg via ORAL
  Filled 2013-02-08 (×4): qty 1

## 2013-02-08 MED ORDER — ENSURE COMPLETE PO LIQD
237.0000 mL | Freq: Three times a day (TID) | ORAL | Status: DC
  Administered 2013-02-08 – 2013-02-12 (×11): 237 mL via ORAL

## 2013-02-08 MED ORDER — POLYETHYLENE GLYCOL 3350 17 G PO PACK
17.0000 g | PACK | Freq: Two times a day (BID) | ORAL | Status: DC
  Administered 2013-02-08 – 2013-02-12 (×7): 17 g via ORAL
  Filled 2013-02-08 (×12): qty 1

## 2013-02-08 NOTE — Progress Notes (Signed)
INITIAL NUTRITION ASSESSMENT  DOCUMENTATION CODES Per approved criteria  -Severe malnutrition in the context of acute illness or injury   INTERVENTION:  Ensure Complete PO TID, each supplement provides 350 kcal and 13 grams of protein.  Recommend liberalize diet to Regular; MD please order.  NUTRITION DIAGNOSIS: Malnutrition related to inadequate oral intake as evidenced by 22% weight loss in the past 6 months with intake </= 50% of estimated energy requirement for >/= 5 days and moderate depletion of muscle and fat mass.  Goal: Intake to meet >90% of estimated nutrition needs.  Monitor:  PO intake, labs, weight trend.  Reason for Assessment: MD Consult for evaluate for PCM  52 y.o. female  Admitting Dx: Decrease in appetite  ASSESSMENT: Patient admitted with decreased appetite, abdominal pain, and weight loss. GI following. Work-up is ongoing. Patient reports pain with eating. Intake at home has been minimal. Now taking nausea/pain medication prior to eating/drinking. Likes strawberry Ensure.  Nutrition Focused Physical Exam:  Subcutaneous Fat:  Orbital Region: mild-moderate depletion Upper Arm Region: mild-moderate depletion Thoracic and Lumbar Region: mild-moderate depletion  Muscle:  Temple Region: mild-moderate depletion Clavicle Bone Region: mild-moderate depletion Clavicle and Acromion Bone Region: mild-moderate depletion Scapular Bone Region: mild-moderate depletion Dorsal Hand: mild-moderate depletion Patellar Region: mild-moderate depletion Anterior Thigh Region: mild-moderate depletion Posterior Calf Region: mild-moderate depletion  Edema: none  Pt meets criteria for severe MALNUTRITION in the context of acute illness as evidenced by 22% weight loss in the past 6 months with intake </= 50% of estimated energy requirement for >/= 5 days and moderate depletion of muscle and fat mass.   Height: Ht Readings from Last 1 Encounters:  02/07/13 5\' 6"  (1.676 m)     Weight: Wt Readings from Last 1 Encounters:  02/07/13 131 lb 8 oz (59.648 kg)    Ideal Body Weight: 59.1 kg  % Ideal Body Weight: 101%  Wt Readings from Last 10 Encounters:  02/07/13 131 lb 8 oz (59.648 kg)  01/23/13 134 lb (60.782 kg)  12/17/12 148 lb (67.132 kg)  11/20/12 150 lb (68.04 kg)  10/31/12 158 lb (71.668 kg)  10/17/12 156 lb (70.761 kg)  09/13/12 168 lb (76.204 kg)  06/07/12 163 lb 12.8 oz (74.299 kg)  11/22/11 159 lb (72.122 kg)  11/08/11 159 lb (72.122 kg)    Usual Body Weight: 168 lb 5 months ago  % Usual Body Weight: 78%  BMI:  Body mass index is 21.23 kg/(m^2).  Estimated Nutritional Needs: Kcal: 1700-1900 Protein: 85-100 gm Fluid: 1.7-2 L  Skin: no wounds  Diet Order: Cardiac  EDUCATION NEEDS: -Education needs addressed   Intake/Output Summary (Last 24 hours) at 02/08/13 1257 Last data filed at 02/08/13 0500  Gross per 24 hour  Intake 941.67 ml  Output      0 ml  Net 941.67 ml    Last BM: 9/19   Labs:   Recent Labs Lab 02/07/13 0841 02/07/13 1908 02/08/13 0430  NA 137  --  139  K 3.4*  --  3.7  CL 98  --  103  CO2 25  --  30  BUN 10  --  5*  CREATININE 0.84 0.90 0.89  CALCIUM 9.7  --  8.6  MG  --  2.1  --   PHOS  --  3.8  --   GLUCOSE 88  --  103*    CBG (last 3)  No results found for this basename: GLUCAP,  in the last 72 hours  Scheduled Meds: .  amLODipine  2.5 mg Oral Daily  . atorvastatin  10 mg Oral q1800  . bisacodyl  10 mg Rectal Once  . cefTRIAXone (ROCEPHIN)  IV  1 g Intravenous Q24H  . cholecalciferol  1,000 Units Oral BID  . escitalopram  20 mg Oral Daily  . feeding supplement  1 Container Oral TID BM  . ferrous sulfate  325 mg Oral Q breakfast  . gabapentin  100 mg Oral TID  . heparin  5,000 Units Subcutaneous Q8H  . pantoprazole  40 mg Oral Q0600  . polyethylene glycol  17 g Oral BID  . potassium chloride  20 mEq Oral Once  . senna-docusate  1 tablet Oral BID  . sodium chloride  3 mL  Intravenous Q12H  . temazepam  30 mg Oral QHS    Continuous Infusions: . dextrose 5 % and 0.45 % NaCl with KCl 20 mEq/L 50 mL/hr at 02/07/13 1946    Past Medical History  Diagnosis Date  . Personal history of unspecified circulatory disease   . Chest pain, unspecified   . Unspecified essential hypertension   . Other and unspecified hyperlipidemia     mixed  . Headache(784.0)   . Cough   . Fibromyalgia   . Depression   . Anxiety     Past Surgical History  Procedure Laterality Date  . Hematoma evacuation      vaginal hematoma 05/23/02  . Anterior perineoplasty      posterior repair. 05/16/02  . Bladder suspension    . Cardiac catheterization  2006    normal coronaries    Joaquin Courts, RD, LDN, CNSC Pager [phone] After Hours Pager [phone]

## 2013-02-08 NOTE — Consult Note (Signed)
[facility] Consult:                                       Covering for Dr Loreta Ave 11:41 AM 02/08/2013   Referring Provider:  Dr Cena Benton  Primary Care Physician:  Carollee Herter, MD Primary Gastroenterologist:  Dr. Loreta Ave. Has done EGD and colonoscopy in 2009  Reason for Consultation:  Abdominal pain and weight loss  HPI: Gina Espinoza is a 52 y.o. female.  Followed by rhematologist for her fibromyalgia, by orthopedics for chronic neck and back pain. Disabled. Hx recurrent cardiac chest pain, negative cardiac cath in 2006, possible coronary spasm treated with Norvasc per Dr Vern Claude notes. Referred by her Gynecologist to psychiatry for chronic depression but was not able to find one that will take her medicaid.  Takes chronic Morphine, oxycodone, anxiolytics.  Not on PPI at home.    Admitted 9/19 with anorexia and weight loss. 168 # 08/2012, 134 # 01/24/13, 139 # currently  Several months of post prandial pain occurring 5 to 15 minutes after meals. Stays nauseated but not vomiting.  Has gotten to where she hardly eats or drinks.  Laxatives are required to have a BM.  Last BM was one month ago. Not passing blood.   Also having urinary urgency and dysuria.   Has not seen GI in several years.    Dr Susann Givens rx'd Phineas Real 01/23/13 but her insurance did not cover the med. She says she tries not to take morphine and oxycodone but lately taking these, alternating with toradol, every day.    CT scan shows calcified nodes in right iliac and bil pelvic sidewall regions.  No worrisome liver, pancreatic, intestinal, renal or uterine findings. Besides mild hypokalemia, which has been corrected, chemistries are unremarkable.  Her CBC shows anemia, Hgb following IVF has gone from 11.8 to 10.5. MCV 77 at admission.   She also has a WBC count of 2.9.  No previous labs for comparison.  On urine there are lots of leukocytes, but no nitrites, minimal WBCs and RBCs and  rare squams and bacteria.    Past Medical History  Diagnosis Date  . Personal history of unspecified circulatory disease   . Chest pain, unspecified   . Unspecified essential hypertension   . Other and unspecified hyperlipidemia     mixed  . Headache(784.0)   . Cough   . Fibromyalgia   . Depression   . Anxiety     Past Surgical History  Procedure Laterality Date  . Hematoma evacuation      vaginal hematoma 05/23/02  . Anterior perineoplasty      posterior repair. 05/16/02  . Bladder suspension    . Cardiac catheterization  2006    normal coronaries    Prior to Admission medications   Medication Sig Start Date End Date Taking? Authorizing Provider  ALPRAZolam Prudy Feeler) 1 MG tablet Take 1 mg by mouth daily as needed for anxiety.   Yes Historical Provider, MD  alprazolam Prudy Feeler) 2 MG tablet Take 2 mg by mouth at bedtime as needed for sleep (as needed).   Yes Historical Provider, MD  Alum & Mag Hydroxide-Simeth (MAGIC MOUTHWASH) SOLN Take 5 mLs by mouth 3 (three) times daily.   Yes Historical Provider, MD  amLODipine (NORVASC) 2.5 MG tablet Take 1 tablet (2.5 mg total) by mouth daily. 12/16/12  Yes Gaylord Shih, MD  cholecalciferol (VITAMIN D) 400  UNITS TABS Take 1,000 Units by mouth 2 (two) times daily.    Yes Historical Provider, MD  clotrimazole (MYCELEX) 10 MG troche Take 10 mg by mouth 3 (three) times daily.    Yes Historical Provider, MD  diazepam (VALIUM) 5 MG tablet Take 10 mg by mouth every 8 (eight) hours as needed for anxiety.   Yes Historical Provider, MD  dicyclomine (BENTYL) 10 MG capsule Take 1 capsule (10 mg total) by mouth 4 (four) times daily -  before meals and at bedtime. 01/24/13  Yes Ronnald Nian, MD  dimenhyDRINATE (DRAMAMINE) 50 MG tablet Take 50 mg by mouth every 8 (eight) hours as needed. For motion sickness   Yes Historical Provider, MD  diphenhydrAMINE (BENADRYL) 25 mg capsule Take 25 mg by mouth every 6 (six) hours as needed for allergies.    Yes Historical  Provider, MD  escitalopram (LEXAPRO) 20 MG tablet Take 20 mg by mouth daily.   Yes Historical Provider, MD  ferrous sulfate 325 (65 FE) MG tablet Take 325 mg by mouth daily with breakfast.     Yes Historical Provider, MD  fluconazole (DIFLUCAN) 150 MG tablet Take 150 mg by mouth once.   Yes Historical Provider, MD  gabapentin (NEURONTIN) 100 MG capsule Take 100 mg by mouth 3 (three) times daily.   Yes Historical Provider, MD  HYDROcodone-acetaminophen (NORCO) 5-325 MG per tablet Take 1 tablet by mouth every 6 (six) hours as needed for pain.    Yes Historical Provider, MD  HYDROmorphone (DILAUDID) 2 MG tablet Take 2 mg by mouth every 4 (four) hours as needed for pain.   Yes Historical Provider, MD  hyoscyamine (LEVBID) 0.375 MG 12 hr tablet Take 1 tablet (0.375 mg total) by mouth every 12 (twelve) hours as needed for cramping. 01/23/13  Yes Ronnald Nian, MD  ketorolac (TORADOL) 10 MG tablet Take 20 mg by mouth every 6 (six) hours as needed for pain.    Yes Historical Provider, MD  lidocaine (LIDODERM) 5 % Place 1 patch onto the skin daily. Remove & Discard patch within 12 hours or as directed by MD//3 patches at a time, 12 hours on and 12 hours off    Yes Historical Provider, MD  lidocaine (XYLOCAINE) 5 % ointment Apply 1 application topically as needed.   Yes Historical Provider, MD  loratadine (CLARITIN) 10 MG tablet Take 10 mg by mouth as needed for allergies.    Yes Historical Provider, MD  metaxalone (SKELAXIN) 800 MG tablet Take 800 mg by mouth 2 (two) times daily as needed for pain.    Yes Historical Provider, MD  morphine (MS CONTIN) 15 MG 12 hr tablet Take 15 mg by mouth 2 (two) times daily as needed for pain.    Yes Historical Provider, MD  morphine (MSIR) 15 MG tablet Take 15 mg by mouth every 4 (four) hours as needed for pain.   Yes Historical Provider, MD  nitroGLYCERIN (NITROSTAT) 0.4 MG SL tablet Place 1 tablet (0.4 mg total) under the tongue every 5 (five) minutes as needed. Place 1  tablet under tongue for chest pains every 5 minutes up to 3 doses in 15 minutes. If pain persist call 911 11/20/12  Yes Gaylord Shih, MD  olopatadine (PATANOL) 0.1 % ophthalmic solution Place 1 drop into both eyes 2 (two) times daily as needed for allergies.    Yes Historical Provider, MD  oxyCODONE-acetaminophen (PERCOCET) 5-325 MG per tablet Take 1 tablet by mouth as directed.   Yes  Historical Provider, MD  promethazine (PHENERGAN) 25 MG tablet Take 25 mg by mouth every 6 (six) hours as needed. For nausea   Yes Historical Provider, MD  rosuvastatin (CRESTOR) 5 MG tablet Take 1 tablet (5 mg total) by mouth daily. 11/20/12  Yes Gaylord Shih, MD  temazepam (RESTORIL) 30 MG capsule Take 30 mg by mouth every other day. Alternates with Ambien   Yes Historical Provider, MD  tiZANidine (ZANAFLEX) 4 MG tablet Take 4 mg by mouth every 8 (eight) hours as needed (for muscle spasms).    Yes Historical Provider, MD  valACYclovir (VALTREX) 500 MG tablet Take 500 mg by mouth daily as needed.    Yes Historical Provider, MD  Xylitol (XYLIMELTS) 500 MG DISK Use as directed 1 tablet in the mouth or throat at bedtime.   Yes Historical Provider, MD  zolpidem (AMBIEN) 10 MG tablet Take 10 mg by mouth every other day. Alternates with temazepam   Yes Historical Provider, MD    Scheduled Meds: . amLODipine  2.5 mg Oral Daily  . atorvastatin  10 mg Oral q1800  . cefTRIAXone (ROCEPHIN)  IV  1 g Intravenous Q24H  . cholecalciferol  1,000 Units Oral BID  . escitalopram  20 mg Oral Daily  . feeding supplement  1 Container Oral TID BM  . ferrous sulfate  325 mg Oral Q breakfast  . gabapentin  100 mg Oral TID  . heparin  5,000 Units Subcutaneous Q8H  . polyethylene glycol  17 g Oral BID  . potassium chloride  20 mEq Oral Once  . senna-docusate  1 tablet Oral BID  . sodium chloride  3 mL Intravenous Q12H  . temazepam  30 mg Oral QHS   Infusions: . dextrose 5 % and 0.45 % NaCl with KCl 20 mEq/L 50 mL/hr at 02/07/13 1946    PRN Meds: ALPRAZolam, HYDROmorphone, loratadine, morphine, morphine, nitroGLYCERIN, olopatadine, ondansetron (ZOFRAN) IV, ondansetron   Allergies as of 02/07/2013 - Review Complete 02/07/2013  Allergen Reaction Noted  . Savella [milnacipran hcl] Anaphylaxis 10/14/2011  . Bee venom    . Codeine  11/05/2008  . Ivp dye [iodinated diagnostic agents]  10/10/2011    Family History  Problem Relation Age of Onset  . Allergies Mother     also children  . Asthma Daughter   . Heart disease Father   . Breast cancer Maternal Grandmother   . Colon cancer Brother   . Asthma Grandchild     History   Social History  . Marital Status: Single    Spouse Name: N/A    Number of Children: N/A  . Years of Education: N/A   Occupational History  . Not on file.   Social History Main Topics  . Smoking status: Never Smoker   . Smokeless tobacco: Not on file  . Alcohol Use: No  . Drug Use: No  . Sexual Activity: Not Currently   Other Topics Concern  . Not on file   Social History Narrative   Single, children, works as a Midwife.  On disability (fibromyalgia, heart, depression/anxiety)    REVIEW OF SYSTEMS: Constitutional:  As per HPi ENT:  No nose bleeds Pulm:  No cough.   Never smoked CV:  No palpitations or LE edema GU:  Per HPI GI:  Per HPI Heme:  Took iron intermittently  for anemia during her childbearing years. .    Transfusions:  At time of child birth Neuro:  + headaches MS:  Hurts all over Derm:  No rash or itching Endocrine:  No excessive thirst, no chills, no sweats Immunization:  Not queried Travel:  none   PHYSICAL EXAM: Vital signs in last 24 hours: Temp:  [98 F (36.7 C)-98.4 F (36.9 C)] 98.3 F (36.8 C) (09/20 0455) Pulse Rate:  [36-59] 57 (09/20 0455) Resp:  [12-20] 18 (09/20 0455) BP: (98-165)/(52-88) 128/76 mmHg (09/20 0455) SpO2:  [95 %-100 %] 98 % (09/20 0455) Weight:  [59.648 kg (131 lb 8 oz)-63.368 kg (139 lb 11.2 oz)] 59.648 kg (131 lb 8  oz) (09/19 2047)  General: thin, depressed AAF.  Not acutely ill.  Uncomfortable. Head:  No asymmetry or swelling  Eyes:  No icterus Ears:  Not HOH  Nose:  No discharge or congestion. Mouth:  Good teeth, moist and clear oral MM Neck:  No jvd, no goiter Lungs:  Clear bil.  No cough or dyspnea Heart: RRR. Slightly bradycardic Abdomen:  Not distended, .   Rectal: no stool in vaults, no blood on exam glove.  No mass   Musc/Skeltl: no joint swelling or deformity. Extremities:  No pedal edema.  Feet warm with 3+ pulses Neurologic:  Oriented x 3, moves all 4 limbs.  Restless leg type movements Skin:  No sores Tattoos:  None Nodes:  No cervical adenopathy   Psych:  Pleasant, depressed with flat affect.   Intake/Output from previous day: 09/19 0701 - 09/20 0700 In: 941.7 [P.O.:480; I.V.:461.7] Out: -  Intake/Output this shift:    LAB RESULTS:  Recent Labs  02/07/13 0841 02/07/13 1908 02/08/13 0430  WBC 3.4* 3.3* 2.9*  HGB 11.8* 11.2* 10.5*  HCT 35.3* 34.1* 32.1*  PLT 224 222 204   BMET Lab Results  Component Value Date   NA 139 02/08/2013   NA 137 02/07/2013   NA 140 01/23/2013   K 3.7 02/08/2013   K 3.4* 02/07/2013   K 3.5 01/23/2013   CL 103 02/08/2013   CL 98 02/07/2013   CL 101 01/23/2013   CO2 30 02/08/2013   CO2 25 02/07/2013   CO2 30 01/23/2013   GLUCOSE 103* 02/08/2013   GLUCOSE 88 02/07/2013   GLUCOSE 79 01/23/2013   BUN 5* 02/08/2013   BUN 10 02/07/2013   BUN 18 01/23/2013   CREATININE 0.89 02/08/2013   CREATININE 0.90 02/07/2013   CREATININE 0.84 02/07/2013   CALCIUM 8.6 02/08/2013   CALCIUM 9.7 02/07/2013   CALCIUM 9.8 01/23/2013   LFT  Recent Labs  02/07/13 0841  PROT 7.7  ALBUMIN 4.2  AST 22  ALT 17  ALKPHOS 94  BILITOT 0.6   PT/INR No results found for this basename: INR,  PROTIME   Hepatitis Panel No results found for this basename: HEPBSAG, HCVAB, HEPAIGM, HEPBIGM,  in the last 72 hours C-Diff No components found with this basename: cdiff    Drugs of  Abuse  No results found for this basename: labopia,  cocainscrnur,  labbenz,  amphetmu,  thcu,  labbarb     RADIOLOGY STUDIES: Ct Abdomen Pelvis Wo Contrast 02/07/2013    FINDINGS: Study is limited without IV contrast. Sagittal images of the spine are unremarkable. Lung bases are unremarkable.  Unenhanced liver shows no biliary ductal dilatation. No calcified gallstones are noted within gallbladder.  Unenhanced pancreas, spleen and adrenal glands are unremarkable.  Unenhanced kidneys are symmetrical in size. No nephrolithiasis. No hydronephrosis or hydroureter.  Oral contrast material was given to the patient. Abdominal aorta is unremarkable. No small bowel obstruction. No ascites or free air.  There is no pericecal inflammation.  Normal appendix is clearly visualize in axial image 53. Moderate stool noted in transverse colon. There is a calcified lymph node just anterior right common iliac vein bifurcation measures 1.3 cm by 0.8 cm. A 2nd calcified lymph node anterior to right psoas muscle at the same level measures 9 mm.  Unenhanced uterus and adnexa is unremarkable. Mild distended urinary bladder. No renal bladder filling defects are noted. Bilateral distal ureter is unremarkable. There is a calcified lymph node in left pelvic side wall measures 11 mm x 7 mm. Probable calcified lymph node right pelvic side wall measures 9 mm. No adnexal masses noted.  IMPRESSION: 1. No nephrolithiasis. No hydronephrosis or hydroureter. 2. No calcified ureteral calculi. 3. No pericecal inflammation. Normal appendix. There is a calcified lymph node just anterior to right common iliac vein measures 1.3 x 0.8 cm. Calcified lymph nodes are noted bilateral pelvic sidewall. 4. No calcified calculi are noted within urinary bladder. 5. Unremarkable uterus and adnexal. No adnexal mass.   Electronically Signed   By: Natasha Mead   On: 02/07/2013 14:44   Dg Chest 2 View 02/07/2013   CLINICAL DATA:  Chest pain, abdominal pain, nausea   EXAM: CHEST  2 VIEW  COMPARISON:  09/13/2012  FINDINGS: The heart size and mediastinal contours are within normal limits. Both lungs are clear. The visualized skeletal structures are stable.  IMPRESSION: No active cardiopulmonary disease.   Electronically Signed   By: Natasha Mead   On: 02/07/2013 09:26    ENDOSCOPIC STUDIES: 05/2007  Colonoscopy  Dr Loreta Ave Documented as normal.  Unable to pull actual report.    IMPRESSION: *  Abdominal pain, weight loss.  Post prandial sxs.  No evidence mesenteric vascular disease on CT.  Rule out NSAID (toradol) induced ulcer disease.  *  Depression.  Long hx of somatic complaints.  *  Fibromyalgia, pain mgt with narcotics and NSAIDs *  Microcytic anemia. Note she has been started on po Iron.  This is likely to worsen her severe constipation and GI sxs.  *  Leukopenia.  *  Constipation.  Apparently normal colonoscopy in 2009.  Senokot, miralax added by attending. *  ? UTI?  *  Polypharmacy.   PLAN: *   Urine clx is pending *  started Protonix 40 mg once daily po *  Set up for EGD tomorrow *   May need to d/c the po Iron.    LOS: 1 day   [name]  02/08/2013, 11:41 AM Pager: [phone]  ________________________________________________________________________  [facility] MD note:  I personally examined the patient, reviewed the data and agree with the assessment and plan described above.  Daily narcotic pain meds may contribute to her GI distress.  EGD tomorrow to check for PUD, gastritis.   Rob Bunting, MD [facility] Pager [phone]

## 2013-02-08 NOTE — Progress Notes (Addendum)
[facility] PROGRESS NOTE  Gina Espinoza ZOX:[id number] DOB: January 02, 1961 DOA: 02/07/2013 PCP: Gina Herter, MD Brief HPI: Gina Espinoza is a 52 y.o. female with history of fibromyalgia on chronic pain medication including oral dilaudid and morphine IR and 12 hour tabs as well as multiple muscle relaxers per EMR, anxiety on benzodiazepines, insomnia on restoril, history of chronic chest pain presumed to be Coronary spasm on norvasc. Presenting with complaints of decrease oral intake for the last 6 weeks. She states that within the last week it has got worse and the problem has been persistent. Nothing she is aware of makes it better.  Denies any diarrhea. Does complain of suprapubic tenderness but denies any dysuria  In the ED patient was found to have sinus bradycardia, leukopenia, and hypokalemia  Assessment/Plan:  1. Decrease in appetite with weight loss of greater than 20 lbs in the last 3 to 4 months. Associated with abdominal pain on eating.  unclear etiology.  - At this point will hold bentyl and levbid  - suspect malnutrition and nutrition consulted.  - she never underwent EGD , colonoscopy in the past, apparently normal as per patient. work up for anemia. Stool for occult blood ordered.  2. Sinus bradycardia  - asymptomatic. No dizziness or lightheadedness.  - monitor on telemetry . Get TSH.  - if no improvement may consider cardiology consult  3. UTI  - Rocephin per pharmacy consult  - urine culture  - U/A showed large leukocytes in urine as well as trace hgb  4. Fibromyalgia  - continue home regimen dilaudid oral for severe pain  - morphine IR and morphine 12 hour tabs. Will not cover with any IV opiods  - Patient reports that opiods cause her to have nausea and as such she requires taking antiemetic prior. Her pain regimen may be contributing to # 1 but she reports being in pain all the time.  5. Depression  - continue home regimen  - May also be contributing to  # 1 if not well controlled, another diagnosis to consider should patient's condition not improve.  6. Hypokalemia  - repleted. - replace orally.  - reassess next am  7. HPL  - continue statin  8. DVT prophylaxis.   9. Constipation: moderate stool on CT abdomen and pelvis.  - started on colace and miralax.      Code Status: full code Family Communication: none at bedside Disposition Plan: pending.    Consultants:  GI consult.   Procedures: none Antibiotics:  none  HPI/Subjective: abd cramping on eating. Very anxious.   Objective: Filed Vitals:   02/08/13 0455  BP: 128/76  Pulse: 57  Temp: 98.3 F (36.8 C)  Resp: 18    Intake/Output Summary (Last 24 hours) at 02/08/13 1157 Last data filed at 02/08/13 0500  Gross per 24 hour  Intake 941.67 ml  Output      0 ml  Net 941.67 ml   Filed Weights   02/07/13 0705 02/07/13 1825 02/07/13 2047  Weight: 59.421 kg (131 lb) 63.368 kg (139 lb 11.2 oz) 59.648 kg (131 lb 8 oz)    Exam:   General:  alerta febrile comfortable,   Cardiovascular: s1s2  Respiratory: ctab  Abdomen: soft NT ND BS+  Musculoskeletal: no pedal edema.   Data Reviewed: Basic Metabolic Panel:  Recent Labs Lab 02/07/13 0841 02/07/13 1908 02/08/13 0430  NA 137  --  139  K 3.4*  --  3.7  CL 98  --  103  CO2 25  --  30  GLUCOSE 88  --  103*  BUN 10  --  5*  CREATININE 0.84 0.90 0.89  CALCIUM 9.7  --  8.6  MG  --  2.1  --   PHOS  --  3.8  --    Liver Function Tests:  Recent Labs Lab 02/07/13 0841  AST 22  ALT 17  ALKPHOS 94  BILITOT 0.6  PROT 7.7  ALBUMIN 4.2    Recent Labs Lab 02/07/13 0841  LIPASE 34   No results found for this basename: AMMONIA,  in the last 168 hours CBC:  Recent Labs Lab 02/07/13 0841 02/07/13 1908 02/08/13 0430  WBC 3.4* 3.3* 2.9*  NEUTROABS 1.6*  --   --   HGB 11.8* 11.2* 10.5*  HCT 35.3* 34.1* 32.1*  MCV 77.8* 78.2 78.9  PLT 224 222 204   Cardiac Enzymes: No results found for  this basename: CKTOTAL, CKMB, CKMBINDEX, TROPONINI,  in the last 168 hours BNP (last 3 results) No results found for this basename: PROBNP,  in the last 8760 hours CBG: No results found for this basename: GLUCAP,  in the last 168 hours  No results found for this or any previous visit (from the past 240 hour(s)).   Studies: Ct Abdomen Pelvis Wo Contrast  02/07/2013   CLINICAL DATA:  Abdominal pain, nausea, vomiting, weight loss  EXAM: CT ABDOMEN AND PELVIS WITHOUT CONTRAST  TECHNIQUE: Multidetector CT imaging of the abdomen and pelvis was performed following the standard protocol without intravenous contrast.  COMPARISON:  None.  FINDINGS: Study is limited without IV contrast. Sagittal images of the spine are unremarkable. Lung bases are unremarkable.  Unenhanced liver shows no biliary ductal dilatation. No calcified gallstones are noted within gallbladder.  Unenhanced pancreas, spleen and adrenal glands are unremarkable.  Unenhanced kidneys are symmetrical in size. No nephrolithiasis. No hydronephrosis or hydroureter.  Oral contrast material was given to the patient. Abdominal aorta is unremarkable. No small bowel obstruction. No ascites or free air.  There is no pericecal inflammation.  Normal appendix is clearly visualize in axial image 53. Moderate stool noted in transverse colon. There is a calcified lymph node just anterior right common iliac vein bifurcation measures 1.3 cm by 0.8 cm. A 2nd calcified lymph node anterior to right psoas muscle at the same level measures 9 mm.  Unenhanced uterus and adnexa is unremarkable. Mild distended urinary bladder. No renal bladder filling defects are noted. Bilateral distal ureter is unremarkable. There is a calcified lymph node in left pelvic side wall measures 11 mm x 7 mm. Probable calcified lymph node right pelvic side wall measures 9 mm. No adnexal masses noted.  IMPRESSION: 1. No nephrolithiasis. No hydronephrosis or hydroureter. 2. No calcified ureteral  calculi. 3. No pericecal inflammation. Normal appendix. There is a calcified lymph node just anterior to right common iliac vein measures 1.3 x 0.8 cm. Calcified lymph nodes are noted bilateral pelvic sidewall. 4. No calcified calculi are noted within urinary bladder. 5. Unremarkable uterus and adnexal. No adnexal mass.   Electronically Signed   By: Natasha Mead   On: 02/07/2013 14:44   Dg Chest 2 View  02/07/2013   CLINICAL DATA:  Chest pain, abdominal pain, nausea  EXAM: CHEST  2 VIEW  COMPARISON:  09/13/2012  FINDINGS: The heart size and mediastinal contours are within normal limits. Both lungs are clear. The visualized skeletal structures are stable.  IMPRESSION: No active cardiopulmonary disease.   Electronically  Signed   By: Natasha Mead   On: 02/07/2013 09:26    Scheduled Meds: . amLODipine  2.5 mg Oral Daily  . atorvastatin  10 mg Oral q1800  . cefTRIAXone (ROCEPHIN)  IV  1 g Intravenous Q24H  . cholecalciferol  1,000 Units Oral BID  . escitalopram  20 mg Oral Daily  . feeding supplement  1 Container Oral TID BM  . ferrous sulfate  325 mg Oral Q breakfast  . gabapentin  100 mg Oral TID  . heparin  5,000 Units Subcutaneous Q8H  . polyethylene glycol  17 g Oral BID  . potassium chloride  20 mEq Oral Once  . senna-docusate  1 tablet Oral BID  . sodium chloride  3 mL Intravenous Q12H  . temazepam  30 mg Oral QHS   Continuous Infusions: . dextrose 5 % and 0.45 % NaCl with KCl 20 mEq/L 50 mL/hr at 02/07/13 1946    Principal Problem:   Decrease in appetite Active Problems:   HYPERLIPIDEMIA-MIXED   HYPERTENSION, UNSPECIFIED   ANGINA, HX OF   Fibromyalgia syndrome   Depression   UTI (lower urinary tract infection)   Hypokalemia   Sinus bradycardia    Time spent: 25  Minutes.    [facility]  [facility] Pager [phone]. If 7PM-7AM, please contact night-coverage at [email or web address], password [facility] 02/08/2013, 11:57 AM  LOS: 1 day

## 2013-02-09 ENCOUNTER — Encounter (HOSPITAL_COMMUNITY): Payer: Self-pay

## 2013-02-09 ENCOUNTER — Encounter (HOSPITAL_COMMUNITY)
Admission: EM | Disposition: A | Discharge status: Skilled Nursing Facility | Payer: Self-pay | Source: Home / Self Care | Attending: Internal Medicine

## 2013-02-09 DIAGNOSIS — E43 Unspecified severe protein-calorie malnutrition: Secondary | ICD-10-CM | POA: Insufficient documentation

## 2013-02-09 DIAGNOSIS — IMO0001 Reserved for inherently not codable concepts without codable children: Secondary | ICD-10-CM | POA: Diagnosis not present

## 2013-02-09 DIAGNOSIS — N390 Urinary tract infection, site not specified: Secondary | ICD-10-CM | POA: Diagnosis not present

## 2013-02-09 DIAGNOSIS — R109 Unspecified abdominal pain: Secondary | ICD-10-CM | POA: Diagnosis not present

## 2013-02-09 DIAGNOSIS — K2970 Gastritis, unspecified, without bleeding: Secondary | ICD-10-CM

## 2013-02-09 DIAGNOSIS — R630 Anorexia: Secondary | ICD-10-CM | POA: Diagnosis not present

## 2013-02-09 HISTORY — PX: ESOPHAGOGASTRODUODENOSCOPY: SHX5428

## 2013-02-09 LAB — URINE CULTURE

## 2013-02-09 SURGERY — EGD (ESOPHAGOGASTRODUODENOSCOPY)
Anesthesia: Moderate Sedation

## 2013-02-09 MED ORDER — PHENAZOPYRIDINE HCL 100 MG PO TABS
100.0000 mg | ORAL_TABLET | Freq: Three times a day (TID) | ORAL | Status: AC
  Administered 2013-02-09 – 2013-02-11 (×6): 100 mg via ORAL
  Filled 2013-02-09 (×6): qty 1

## 2013-02-09 MED ORDER — BUTAMBEN-TETRACAINE-BENZOCAINE 2-2-14 % EX AERO
INHALATION_SPRAY | CUTANEOUS | Status: DC | PRN
  Administered 2013-02-09: 2 via TOPICAL

## 2013-02-09 MED ORDER — PANTOPRAZOLE SODIUM 40 MG IV SOLR
40.0000 mg | Freq: Once | INTRAVENOUS | Status: AC
  Administered 2013-02-09: 40 mg via INTRAVENOUS
  Filled 2013-02-09: qty 40

## 2013-02-09 MED ORDER — HYDROMORPHONE HCL PF 1 MG/ML IJ SOLN
1.0000 mg | INTRAMUSCULAR | Status: DC | PRN
  Administered 2013-02-09 – 2013-02-12 (×14): 1 mg via INTRAVENOUS
  Filled 2013-02-09 (×14): qty 1

## 2013-02-09 MED ORDER — FENTANYL CITRATE 0.05 MG/ML IJ SOLN
INTRAMUSCULAR | Status: AC
  Filled 2013-02-09: qty 4

## 2013-02-09 MED ORDER — DIPHENHYDRAMINE HCL 50 MG/ML IJ SOLN
INTRAMUSCULAR | Status: AC
  Filled 2013-02-09: qty 1

## 2013-02-09 MED ORDER — HYDROMORPHONE HCL PF 1 MG/ML IJ SOLN
0.5000 mg | INTRAMUSCULAR | Status: DC | PRN
  Administered 2013-02-09: 0.5 mg via INTRAVENOUS
  Filled 2013-02-09: qty 1

## 2013-02-09 MED ORDER — MIDAZOLAM HCL 10 MG/2ML IJ SOLN
INTRAMUSCULAR | Status: DC | PRN
  Administered 2013-02-09 (×2): 2 mg via INTRAVENOUS

## 2013-02-09 MED ORDER — ONDANSETRON HCL 4 MG PO TABS
4.0000 mg | ORAL_TABLET | ORAL | Status: DC | PRN
  Administered 2013-02-12: 4 mg via ORAL
  Filled 2013-02-09 (×2): qty 1

## 2013-02-09 MED ORDER — FENTANYL CITRATE 0.05 MG/ML IJ SOLN
INTRAMUSCULAR | Status: DC | PRN
  Administered 2013-02-09: 25 ug via INTRAVENOUS

## 2013-02-09 MED ORDER — ONDANSETRON HCL 4 MG/2ML IJ SOLN
4.0000 mg | INTRAMUSCULAR | Status: DC | PRN
  Administered 2013-02-09 – 2013-02-12 (×12): 4 mg via INTRAVENOUS
  Filled 2013-02-09 (×13): qty 2

## 2013-02-09 MED ORDER — MIDAZOLAM HCL 5 MG/ML IJ SOLN
INTRAMUSCULAR | Status: AC
  Filled 2013-02-09: qty 2

## 2013-02-09 MED ORDER — DIPHENHYDRAMINE HCL 50 MG/ML IJ SOLN
INTRAMUSCULAR | Status: DC | PRN
  Administered 2013-02-09: 25 mg via INTRAVENOUS

## 2013-02-09 NOTE — Progress Notes (Signed)
Patient had a small BM that was light yellow to white with minute streaks of bright red blood.  Upon examination patient has what appear to be external hemrhoids.  Will continue to monitor patient.

## 2013-02-09 NOTE — Progress Notes (Signed)
[facility] PROGRESS NOTE  Gina Espinoza BMW:[id number] DOB: 04-16-1961 DOA: 02/07/2013 PCP: Carollee Herter, MD Brief HPI: Gina Espinoza is a 52 y.o. female with history of fibromyalgia on chronic pain medication including oral dilaudid and morphine IR and 12 hour tabs as well as multiple muscle relaxers per EMR, anxiety on benzodiazepines, insomnia on restoril, history of chronic chest pain presumed to be Coronary spasm on norvasc. Presenting with complaints of decrease oral intake for the last 6 weeks. She states that within the last week it has got worse and the problem has been persistent. Nothing she is aware of makes it better.  Denies any diarrhea. Does complain of suprapubic tenderness but denies any dysuria  In the ED patient was found to have sinus bradycardia, leukopenia, and hypokalemia  Assessment/Plan:  1. Decrease in appetite with weight loss of greater than 20 lbs in the last 3 to 4 months. Associated with abdominal pain on eating.  unclear etiology.  - At this point will hold bentyl and levbid  - suspect malnutrition and nutrition consulted.  - she never underwent EGD , colonoscopy in the past, apparently normal as per patient. work up for anemia. Stool for occult blood ordered.  - GI consulted and she underwent EGD on 9/21 , showed distal gastritis and retained food int he stomach, which was probably from the chronic use of narcotics slowing the bowel motility. Biopsies were taken and recommendations to follow up as outpatient.  2. Sinus bradycardia  - asymptomatic. No dizziness or lightheadedness. Resolved.  - monitor on telemetry .  TSH normal.  -  3. UTI  - Rocephin per pharmacy consult  - urine culture  - U/A showed large leukocytes in urine as well as trace hgb  4. Fibromyalgia  - continue home regimen dilaudid oral for severe pain  - morphine IR and morphine 12 hour tabs. Will not cover with any IV opiods  - Patient reports that opiods cause her to have  nausea and as such she requires taking antiemetic prior. Her pain regimen may be contributing to # 1 but she reports being in pain all the time.  5. Depression  - continue home regimen  - May also be contributing to # 1 if not well controlled, another diagnosis to consider should patient's condition not improve.  6. Hypokalemia  - repleted. - replace orally.  - reassess next am  7. HPL  - continue statin  8. DVT prophylaxis.   9. Constipation: moderate stool on CT abdomen and pelvis.  - started on colace and miralax.      Code Status: full code Family Communication: none at bedside Disposition Plan: pending.    Consultants:  GI consult.   Procedures: none Antibiotics:  none  HPI/Subjective: abd cramping on eating. Very anxious.   Objective: Filed Vitals:   02/09/13 1050  BP: 104/61  Pulse:   Temp:   Resp: 46    Intake/Output Summary (Last 24 hours) at 02/09/13 1124 Last data filed at 02/09/13 0803  Gross per 24 hour  Intake   1210 ml  Output    101 ml  Net   1109 ml   Filed Weights   02/07/13 1825 02/07/13 2047 02/09/13 0517  Weight: 63.368 kg (139 lb 11.2 oz) 59.648 kg (131 lb 8 oz) 63.05 kg (139 lb)    Exam:   General:  alerta febrile comfortable,   Cardiovascular: s1s2  Respiratory: ctab  Abdomen: soft NT ND BS+  Musculoskeletal: no pedal edema.  Data Reviewed: Basic Metabolic Panel:  Recent Labs Lab 02/07/13 0841 02/07/13 1908 02/08/13 0430  NA 137  --  139  K 3.4*  --  3.7  CL 98  --  103  CO2 25  --  30  GLUCOSE 88  --  103*  BUN 10  --  5*  CREATININE 0.84 0.90 0.89  CALCIUM 9.7  --  8.6  MG  --  2.1  --   PHOS  --  3.8  --    Liver Function Tests:  Recent Labs Lab 02/07/13 0841  AST 22  ALT 17  ALKPHOS 94  BILITOT 0.6  PROT 7.7  ALBUMIN 4.2    Recent Labs Lab 02/07/13 0841  LIPASE 34   No results found for this basename: AMMONIA,  in the last 168 hours CBC:  Recent Labs Lab 02/07/13 0841  02/07/13 1908 02/08/13 0430  WBC 3.4* 3.3* 2.9*  NEUTROABS 1.6*  --   --   HGB 11.8* 11.2* 10.5*  HCT 35.3* 34.1* 32.1*  MCV 77.8* 78.2 78.9  PLT 224 222 204   Cardiac Enzymes: No results found for this basename: CKTOTAL, CKMB, CKMBINDEX, TROPONINI,  in the last 168 hours BNP (last 3 results) No results found for this basename: PROBNP,  in the last 8760 hours CBG: No results found for this basename: GLUCAP,  in the last 168 hours  No results found for this or any previous visit (from the past 240 hour(s)).   Studies: Ct Abdomen Pelvis Wo Contrast  02/07/2013   CLINICAL DATA:  Abdominal pain, nausea, vomiting, weight loss  EXAM: CT ABDOMEN AND PELVIS WITHOUT CONTRAST  TECHNIQUE: Multidetector CT imaging of the abdomen and pelvis was performed following the standard protocol without intravenous contrast.  COMPARISON:  None.  FINDINGS: Study is limited without IV contrast. Sagittal images of the spine are unremarkable. Lung bases are unremarkable.  Unenhanced liver shows no biliary ductal dilatation. No calcified gallstones are noted within gallbladder.  Unenhanced pancreas, spleen and adrenal glands are unremarkable.  Unenhanced kidneys are symmetrical in size. No nephrolithiasis. No hydronephrosis or hydroureter.  Oral contrast material was given to the patient. Abdominal aorta is unremarkable. No small bowel obstruction. No ascites or free air.  There is no pericecal inflammation.  Normal appendix is clearly visualize in axial image 53. Moderate stool noted in transverse colon. There is a calcified lymph node just anterior right common iliac vein bifurcation measures 1.3 cm by 0.8 cm. A 2nd calcified lymph node anterior to right psoas muscle at the same level measures 9 mm.  Unenhanced uterus and adnexa is unremarkable. Mild distended urinary bladder. No renal bladder filling defects are noted. Bilateral distal ureter is unremarkable. There is a calcified lymph node in left pelvic side wall  measures 11 mm x 7 mm. Probable calcified lymph node right pelvic side wall measures 9 mm. No adnexal masses noted.  IMPRESSION: 1. No nephrolithiasis. No hydronephrosis or hydroureter. 2. No calcified ureteral calculi. 3. No pericecal inflammation. Normal appendix. There is a calcified lymph node just anterior to right common iliac vein measures 1.3 x 0.8 cm. Calcified lymph nodes are noted bilateral pelvic sidewall. 4. No calcified calculi are noted within urinary bladder. 5. Unremarkable uterus and adnexal. No adnexal mass.   Electronically Signed   By: Natasha Mead   On: 02/07/2013 14:44    Scheduled Meds: . amLODipine  2.5 mg Oral Daily  . atorvastatin  10 mg Oral q1800  . cefTRIAXone (ROCEPHIN)  IV  1 g Intravenous Q24H  . cholecalciferol  1,000 Units Oral BID  . escitalopram  20 mg Oral Daily  . feeding supplement  237 mL Oral TID BM  . ferrous sulfate  325 mg Oral Q breakfast  . gabapentin  100 mg Oral TID  . heparin  5,000 Units Subcutaneous Q8H  . pantoprazole  40 mg Oral Q0600  . polyethylene glycol  17 g Oral BID  . potassium chloride  20 mEq Oral Once  . senna-docusate  1 tablet Oral BID  . sodium chloride  3 mL Intravenous Q12H  . temazepam  30 mg Oral QHS   Continuous Infusions: . dextrose 5 % and 0.45 % NaCl with KCl 20 mEq/L 50 mL/hr at 02/08/13 1700    Principal Problem:   Decrease in appetite Active Problems:   HYPERLIPIDEMIA-MIXED   HYPERTENSION, UNSPECIFIED   ANGINA, HX OF   Fibromyalgia syndrome   Depression   UTI (lower urinary tract infection)   Hypokalemia   Sinus bradycardia   Unspecified gastritis and gastroduodenitis without mention of hemorrhage   Protein-calorie malnutrition, severe    Time spent: 25  Minutes.    [facility]  [facility] Pager [phone]. If 7PM-7AM, please contact night-coverage at [email or web address], password [facility] 02/09/2013, 11:24 AM  LOS: 2 days

## 2013-02-09 NOTE — Interval H&P Note (Signed)
History and Physical Interval Note:  02/09/2013 10:14 AM  Gina Espinoza  has presented today for surgery, with the diagnosis of weight loss, nausea, abdominal pain.  The various methods of treatment have been discussed with the patient and family. After consideration of risks, benefits and other options for treatment, the patient has consented to  Procedure(s): ESOPHAGOGASTRODUODENOSCOPY (EGD) (N/A) as a surgical intervention .  The patient's history has been reviewed, patient examined, no change in status, stable for surgery.  I have reviewed the patient's chart and labs.  Questions were answered to the patient's satisfaction.     Rachael Fee

## 2013-02-09 NOTE — Progress Notes (Addendum)
Patient complains that burning when she urinates has increased.  She is NPO for EGD.  Pharmacy called to see if there is something IV she may have for the burning vs PO that will not change the color of her bodily secretions; pharmacy stated there was nothing.  Also bladder was scanned and there was less than 200cc in the bladder. Claiborne Billings, NP was called and an IV dose of dilaudid was ordered.  A heating pack was also placed for comfort.  Will continue to monitor patient.

## 2013-02-09 NOTE — H&P (View-Only) (Signed)
[facility] Consult:                                       Covering for Dr Mann 11:41 AM 02/08/2013   Referring Provider:  Dr Vega  Primary Care Physician:  LALONDE,JOHN CHARLES, MD Primary Gastroenterologist:  Dr. Mann. Has done EGD and colonoscopy in 2009  Reason for Consultation:  Abdominal pain and weight loss  HPI: Gina Espinoza is a 51 y.o. female.  Followed by rhematologist for her fibromyalgia, by orthopedics for chronic neck and back pain. Disabled. Hx recurrent cardiac chest pain, negative cardiac cath in 2006, possible coronary spasm treated with Norvasc per Dr Wall's notes. Referred by her Gynecologist to psychiatry for chronic depression but was not able to find one that will take her medicaid.  Takes chronic Morphine, oxycodone, anxiolytics.  Not on PPI at home.    Admitted 9/19 with anorexia and weight loss. 168 # 08/2012, 134 # 01/24/13, 139 # currently  Several months of post prandial pain occurring 5 to 15 minutes after meals. Stays nauseated but not vomiting.  Has gotten to where she hardly eats or drinks.  Laxatives are required to have a BM.  Last BM was one month ago. Not passing blood.   Also having urinary urgency and dysuria.   Has not seen GI in several years.    Dr Lalonde rx'd Levbid 01/23/13 but her insurance did not cover the med. She says she tries not to take morphine and oxycodone but lately taking these, alternating with toradol, every day.    CT scan shows calcified nodes in right iliac and bil pelvic sidewall regions.  No worrisome liver, pancreatic, intestinal, renal or uterine findings. Besides mild hypokalemia, which has been corrected, chemistries are unremarkable.  Her CBC shows anemia, Hgb following IVF has gone from 11.8 to 10.5. MCV 77 at admission.   She also has a WBC count of 2.9.  No previous labs for comparison.  On urine there are lots of leukocytes, but no nitrites, minimal WBCs and RBCs and  rare squams and bacteria.    Past Medical History  Diagnosis Date  . Personal history of unspecified circulatory disease   . Chest pain, unspecified   . Unspecified essential hypertension   . Other and unspecified hyperlipidemia     mixed  . Headache(784.0)   . Cough   . Fibromyalgia   . Depression   . Anxiety     Past Surgical History  Procedure Laterality Date  . Hematoma evacuation      vaginal hematoma 05/23/02  . Anterior perineoplasty      posterior repair. 05/16/02  . Bladder suspension    . Cardiac catheterization  2006    normal coronaries    Prior to Admission medications   Medication Sig Start Date End Date Taking? Authorizing Provider  ALPRAZolam (XANAX) 1 MG tablet Take 1 mg by mouth daily as needed for anxiety.   Yes Historical Provider, MD  alprazolam (XANAX) 2 MG tablet Take 2 mg by mouth at bedtime as needed for sleep (as needed).   Yes Historical Provider, MD  Alum & Mag Hydroxide-Simeth (MAGIC MOUTHWASH) SOLN Take 5 mLs by mouth 3 (three) times daily.   Yes Historical Provider, MD  amLODipine (NORVASC) 2.5 MG tablet Take 1 tablet (2.5 mg total) by mouth daily. 12/16/12  Yes Thomas C Wall, MD  cholecalciferol (VITAMIN D) 400   UNITS TABS Take 1,000 Units by mouth 2 (two) times daily.    Yes Historical Provider, MD  clotrimazole (MYCELEX) 10 MG troche Take 10 mg by mouth 3 (three) times daily.    Yes Historical Provider, MD  diazepam (VALIUM) 5 MG tablet Take 10 mg by mouth every 8 (eight) hours as needed for anxiety.   Yes Historical Provider, MD  dicyclomine (BENTYL) 10 MG capsule Take 1 capsule (10 mg total) by mouth 4 (four) times daily -  before meals and at bedtime. 01/24/13  Yes John C Lalonde, MD  dimenhyDRINATE (DRAMAMINE) 50 MG tablet Take 50 mg by mouth every 8 (eight) hours as needed. For motion sickness   Yes Historical Provider, MD  diphenhydrAMINE (BENADRYL) 25 mg capsule Take 25 mg by mouth every 6 (six) hours as needed for allergies.    Yes Historical  Provider, MD  escitalopram (LEXAPRO) 20 MG tablet Take 20 mg by mouth daily.   Yes Historical Provider, MD  ferrous sulfate 325 (65 FE) MG tablet Take 325 mg by mouth daily with breakfast.     Yes Historical Provider, MD  fluconazole (DIFLUCAN) 150 MG tablet Take 150 mg by mouth once.   Yes Historical Provider, MD  gabapentin (NEURONTIN) 100 MG capsule Take 100 mg by mouth 3 (three) times daily.   Yes Historical Provider, MD  HYDROcodone-acetaminophen (NORCO) 5-325 MG per tablet Take 1 tablet by mouth every 6 (six) hours as needed for pain.    Yes Historical Provider, MD  HYDROmorphone (DILAUDID) 2 MG tablet Take 2 mg by mouth every 4 (four) hours as needed for pain.   Yes Historical Provider, MD  hyoscyamine (LEVBID) 0.375 MG 12 hr tablet Take 1 tablet (0.375 mg total) by mouth every 12 (twelve) hours as needed for cramping. 01/23/13  Yes John C Lalonde, MD  ketorolac (TORADOL) 10 MG tablet Take 20 mg by mouth every 6 (six) hours as needed for pain.    Yes Historical Provider, MD  lidocaine (LIDODERM) 5 % Place 1 patch onto the skin daily. Remove & Discard patch within 12 hours or as directed by MD//3 patches at a time, 12 hours on and 12 hours off    Yes Historical Provider, MD  lidocaine (XYLOCAINE) 5 % ointment Apply 1 application topically as needed.   Yes Historical Provider, MD  loratadine (CLARITIN) 10 MG tablet Take 10 mg by mouth as needed for allergies.    Yes Historical Provider, MD  metaxalone (SKELAXIN) 800 MG tablet Take 800 mg by mouth 2 (two) times daily as needed for pain.    Yes Historical Provider, MD  morphine (MS CONTIN) 15 MG 12 hr tablet Take 15 mg by mouth 2 (two) times daily as needed for pain.    Yes Historical Provider, MD  morphine (MSIR) 15 MG tablet Take 15 mg by mouth every 4 (four) hours as needed for pain.   Yes Historical Provider, MD  nitroGLYCERIN (NITROSTAT) 0.4 MG SL tablet Place 1 tablet (0.4 mg total) under the tongue every 5 (five) minutes as needed. Place 1  tablet under tongue for chest pains every 5 minutes up to 3 doses in 15 minutes. If pain persist call 911 11/20/12  Yes Thomas C Wall, MD  olopatadine (PATANOL) 0.1 % ophthalmic solution Place 1 drop into both eyes 2 (two) times daily as needed for allergies.    Yes Historical Provider, MD  oxyCODONE-acetaminophen (PERCOCET) 5-325 MG per tablet Take 1 tablet by mouth as directed.   Yes   Historical Provider, MD  promethazine (PHENERGAN) 25 MG tablet Take 25 mg by mouth every 6 (six) hours as needed. For nausea   Yes Historical Provider, MD  rosuvastatin (CRESTOR) 5 MG tablet Take 1 tablet (5 mg total) by mouth daily. 11/20/12  Yes Thomas C Wall, MD  temazepam (RESTORIL) 30 MG capsule Take 30 mg by mouth every other day. Alternates with Ambien   Yes Historical Provider, MD  tiZANidine (ZANAFLEX) 4 MG tablet Take 4 mg by mouth every 8 (eight) hours as needed (for muscle spasms).    Yes Historical Provider, MD  valACYclovir (VALTREX) 500 MG tablet Take 500 mg by mouth daily as needed.    Yes Historical Provider, MD  Xylitol (XYLIMELTS) 500 MG DISK Use as directed 1 tablet in the mouth or throat at bedtime.   Yes Historical Provider, MD  zolpidem (AMBIEN) 10 MG tablet Take 10 mg by mouth every other day. Alternates with temazepam   Yes Historical Provider, MD    Scheduled Meds: . amLODipine  2.5 mg Oral Daily  . atorvastatin  10 mg Oral q1800  . cefTRIAXone (ROCEPHIN)  IV  1 g Intravenous Q24H  . cholecalciferol  1,000 Units Oral BID  . escitalopram  20 mg Oral Daily  . feeding supplement  1 Container Oral TID BM  . ferrous sulfate  325 mg Oral Q breakfast  . gabapentin  100 mg Oral TID  . heparin  5,000 Units Subcutaneous Q8H  . polyethylene glycol  17 g Oral BID  . potassium chloride  20 mEq Oral Once  . senna-docusate  1 tablet Oral BID  . sodium chloride  3 mL Intravenous Q12H  . temazepam  30 mg Oral QHS   Infusions: . dextrose 5 % and 0.45 % NaCl with KCl 20 mEq/L 50 mL/hr at 02/07/13 1946    PRN Meds: ALPRAZolam, HYDROmorphone, loratadine, morphine, morphine, nitroGLYCERIN, olopatadine, ondansetron (ZOFRAN) IV, ondansetron   Allergies as of 02/07/2013 - Review Complete 02/07/2013  Allergen Reaction Noted  . Savella [milnacipran hcl] Anaphylaxis 10/14/2011  . Bee venom    . Codeine  11/05/2008  . Ivp dye [iodinated diagnostic agents]  10/10/2011    Family History  Problem Relation Age of Onset  . Allergies Mother     also children  . Asthma Daughter   . Heart disease Father   . Breast cancer Maternal Grandmother   . Colon cancer Brother   . Asthma Grandchild     History   Social History  . Marital Status: Single    Spouse Name: N/A    Number of Children: N/A  . Years of Education: N/A   Occupational History  . Not on file.   Social History Main Topics  . Smoking status: Never Smoker   . Smokeless tobacco: Not on file  . Alcohol Use: No  . Drug Use: No  . Sexual Activity: Not Currently   Other Topics Concern  . Not on file   Social History Narrative   Single, children, works as a bus driver.  On disability (fibromyalgia, heart, depression/anxiety)    REVIEW OF SYSTEMS: Constitutional:  As per HPi ENT:  No nose bleeds Pulm:  No cough.   Never smoked CV:  No palpitations or LE edema GU:  Per HPI GI:  Per HPI Heme:  Took iron intermittently  for anemia during her childbearing years. .    Transfusions:  At time of child birth Neuro:  + headaches MS:  Hurts all over Derm:    No rash or itching Endocrine:  No excessive thirst, no chills, no sweats Immunization:  Not queried Travel:  none   PHYSICAL EXAM: Vital signs in last 24 hours: Temp:  [98 F (36.7 C)-98.4 F (36.9 C)] 98.3 F (36.8 C) (09/20 0455) Pulse Rate:  [36-59] 57 (09/20 0455) Resp:  [12-20] 18 (09/20 0455) BP: (98-165)/(52-88) 128/76 mmHg (09/20 0455) SpO2:  [95 %-100 %] 98 % (09/20 0455) Weight:  [59.648 kg (131 lb 8 oz)-63.368 kg (139 lb 11.2 oz)] 59.648 kg (131 lb 8  oz) (09/19 2047)  General: thin, depressed AAF.  Not acutely ill.  Uncomfortable. Head:  No asymmetry or swelling  Eyes:  No icterus Ears:  Not HOH  Nose:  No discharge or congestion. Mouth:  Good teeth, moist and clear oral MM Neck:  No jvd, no goiter Lungs:  Clear bil.  No cough or dyspnea Heart: RRR. Slightly bradycardic Abdomen:  Not distended, .   Rectal: no stool in vaults, no blood on exam glove.  No mass   Musc/Skeltl: no joint swelling or deformity. Extremities:  No pedal edema.  Feet warm with 3+ pulses Neurologic:  Oriented x 3, moves all 4 limbs.  Restless leg type movements Skin:  No sores Tattoos:  None Nodes:  No cervical adenopathy   Psych:  Pleasant, depressed with flat affect.   Intake/Output from previous day: 09/19 0701 - 09/20 0700 In: 941.7 [P.O.:480; I.V.:461.7] Out: -  Intake/Output this shift:    LAB RESULTS:  Recent Labs  02/07/13 0841 02/07/13 1908 02/08/13 0430  WBC 3.4* 3.3* 2.9*  HGB 11.8* 11.2* 10.5*  HCT 35.3* 34.1* 32.1*  PLT 224 222 204   BMET Lab Results  Component Value Date   NA 139 02/08/2013   NA 137 02/07/2013   NA 140 01/23/2013   K 3.7 02/08/2013   K 3.4* 02/07/2013   K 3.5 01/23/2013   CL 103 02/08/2013   CL 98 02/07/2013   CL 101 01/23/2013   CO2 30 02/08/2013   CO2 25 02/07/2013   CO2 30 01/23/2013   GLUCOSE 103* 02/08/2013   GLUCOSE 88 02/07/2013   GLUCOSE 79 01/23/2013   BUN 5* 02/08/2013   BUN 10 02/07/2013   BUN 18 01/23/2013   CREATININE 0.89 02/08/2013   CREATININE 0.90 02/07/2013   CREATININE 0.84 02/07/2013   CALCIUM 8.6 02/08/2013   CALCIUM 9.7 02/07/2013   CALCIUM 9.8 01/23/2013   LFT  Recent Labs  02/07/13 0841  PROT 7.7  ALBUMIN 4.2  AST 22  ALT 17  ALKPHOS 94  BILITOT 0.6   PT/INR No results found for this basename: INR,  PROTIME   Hepatitis Panel No results found for this basename: HEPBSAG, HCVAB, HEPAIGM, HEPBIGM,  in the last 72 hours C-Diff No components found with this basename: cdiff    Drugs of  Abuse  No results found for this basename: labopia,  cocainscrnur,  labbenz,  amphetmu,  thcu,  labbarb     RADIOLOGY STUDIES: Ct Abdomen Pelvis Wo Contrast 02/07/2013    FINDINGS: Study is limited without IV contrast. Sagittal images of the spine are unremarkable. Lung bases are unremarkable.  Unenhanced liver shows no biliary ductal dilatation. No calcified gallstones are noted within gallbladder.  Unenhanced pancreas, spleen and adrenal glands are unremarkable.  Unenhanced kidneys are symmetrical in size. No nephrolithiasis. No hydronephrosis or hydroureter.  Oral contrast material was given to the patient. Abdominal aorta is unremarkable. No small bowel obstruction. No ascites or free air.    There is no pericecal inflammation.  Normal appendix is clearly visualize in axial image 53. Moderate stool noted in transverse colon. There is a calcified lymph node just anterior right common iliac vein bifurcation measures 1.3 cm by 0.8 cm. A 2nd calcified lymph node anterior to right psoas muscle at the same level measures 9 mm.  Unenhanced uterus and adnexa is unremarkable. Mild distended urinary bladder. No renal bladder filling defects are noted. Bilateral distal ureter is unremarkable. There is a calcified lymph node in left pelvic side wall measures 11 mm x 7 mm. Probable calcified lymph node right pelvic side wall measures 9 mm. No adnexal masses noted.  IMPRESSION: 1. No nephrolithiasis. No hydronephrosis or hydroureter. 2. No calcified ureteral calculi. 3. No pericecal inflammation. Normal appendix. There is a calcified lymph node just anterior to right common iliac vein measures 1.3 x 0.8 cm. Calcified lymph nodes are noted bilateral pelvic sidewall. 4. No calcified calculi are noted within urinary bladder. 5. Unremarkable uterus and adnexal. No adnexal mass.   Electronically Signed   By: Liviu  Pop   On: 02/07/2013 14:44   Dg Chest 2 View 02/07/2013   CLINICAL DATA:  Chest pain, abdominal pain, nausea   EXAM: CHEST  2 VIEW  COMPARISON:  09/13/2012  FINDINGS: The heart size and mediastinal contours are within normal limits. Both lungs are clear. The visualized skeletal structures are stable.  IMPRESSION: No active cardiopulmonary disease.   Electronically Signed   By: Liviu  Pop   On: 02/07/2013 09:26    ENDOSCOPIC STUDIES: 05/2007  Colonoscopy  Dr Mann Documented as normal.  Unable to pull actual report.    IMPRESSION: *  Abdominal pain, weight loss.  Post prandial sxs.  No evidence mesenteric vascular disease on CT.  Rule out NSAID (toradol) induced ulcer disease.  *  Depression.  Long hx of somatic complaints.  *  Fibromyalgia, pain mgt with narcotics and NSAIDs *  Microcytic anemia. Note she has been started on po Iron.  This is likely to worsen her severe constipation and GI sxs.  *  Leukopenia.  *  Constipation.  Apparently normal colonoscopy in 2009.  Senokot, miralax added by attending. *  ? UTI?  *  Polypharmacy.   PLAN: *   Urine clx is pending *  started Protonix 40 mg once daily po *  Set up for EGD tomorrow *   May need to d/c the po Iron.    LOS: 1 day   Sarah Gribbin  02/08/2013, 11:41 AM Pager: [phone]  ________________________________________________________________________  [facility] MD note:  I personally examined the patient, reviewed the data and agree with the assessment and plan described above.  Daily narcotic pain meds may contribute to her GI distress.  EGD tomorrow to check for PUD, gastritis.   Syrus Nakama, MD [facility] Pager [phone]      

## 2013-02-09 NOTE — Op Note (Signed)
[facility] [address] [place], [address]   ENDOSCOPY PROCEDURE REPORT  PATIENT: Gina, Espinoza  MR#: [id number] BIRTHDATE: 1960-08-05 , 51  yrs. old GENDER: Female ENDOSCOPIST: Rachael Fee, MD REFERRED BY:  [facility] PROCEDURE DATE:  02/09/2013 PROCEDURE:  EGD w/ biopsy ASA CLASS:     Class II INDICATIONS:  post prandial abdominal pain, weight loss. MEDICATIONS: Fentanyl 25 mcg IV, Versed 4 mg IV, and Benadryl 25 mg IV TOPICAL ANESTHETIC: Cetacaine Spray  DESCRIPTION OF PROCEDURE: After the risks benefits and alternatives of the procedure were thoroughly explained, informed consent was obtained.  The Pentax Gastroscope [id number] endoscope was introduced through the mouth and advanced to the second portion of the duodenum. Without limitations.  The instrument was slowly withdrawn as the mucosa was fully examined.      There was a moderate amount of retained solid and liquid gastric contents without anatomic obstruction.  There was mild to moderate distal gastritis which was biopsied to check for H.  pylori.  The examination was otherwise normal.  Retroflexed views revealed no abnormalities.     The scope was then withdrawn from the patient and the procedure completed.  COMPLICATIONS: There were no complications. ENDOSCOPIC IMPRESSION: There was a moderate amount of retained solid and liquid gastric contents without anatomic obstruction.  There was mild to moderate distal gastritis which was biopsied to check for H.  pylori.  The examination was otherwise normal.  RECOMMENDATIONS: If biopsies show H.  pylori you will be started on appropriate antibiotics.  It appears that your stomach does not function normally, probably due to your chronic daily narcotic usage (can signficantly slow stomach function).  You should try to wean those pain meds down as best as possible.   GI will set up outpatient follow up with me in 3-4  weeks to check your symptoms, also consider colonoscopy as outpatient (was Heme + as outpatient, being referred by Dr. Susann Givens).   eSigned:  Rachael Fee, MD 02/09/2013 10:40 AM

## 2013-02-10 ENCOUNTER — Encounter (HOSPITAL_COMMUNITY): Payer: Self-pay | Admitting: Gastroenterology

## 2013-02-10 ENCOUNTER — Inpatient Hospital Stay (HOSPITAL_COMMUNITY): Payer: Medicare Other

## 2013-02-10 DIAGNOSIS — R109 Unspecified abdominal pain: Secondary | ICD-10-CM

## 2013-02-10 DIAGNOSIS — N289 Disorder of kidney and ureter, unspecified: Secondary | ICD-10-CM | POA: Diagnosis not present

## 2013-02-10 DIAGNOSIS — R630 Anorexia: Secondary | ICD-10-CM | POA: Diagnosis not present

## 2013-02-10 DIAGNOSIS — K2970 Gastritis, unspecified, without bleeding: Secondary | ICD-10-CM | POA: Diagnosis not present

## 2013-02-10 DIAGNOSIS — K3184 Gastroparesis: Secondary | ICD-10-CM | POA: Diagnosis not present

## 2013-02-10 DIAGNOSIS — K5900 Constipation, unspecified: Secondary | ICD-10-CM

## 2013-02-10 DIAGNOSIS — IMO0001 Reserved for inherently not codable concepts without codable children: Secondary | ICD-10-CM | POA: Diagnosis not present

## 2013-02-10 MED ORDER — MAGNESIUM HYDROXIDE 400 MG/5ML PO SUSP: 30.0000 mL | Freq: Every day | ORAL | Status: DC | PRN

## 2013-02-10 MED ORDER — HYDROCORTISONE ACE-PRAMOXINE 1-1 % RE FOAM
1.0000 | Freq: Two times a day (BID) | RECTAL | Status: DC
  Administered 2013-02-10 – 2013-02-12 (×4): 1 via RECTAL
  Filled 2013-02-10: qty 10

## 2013-02-10 MED ORDER — INFLUENZA VAC SPLIT QUAD 0.5 ML IM SUSP
0.5000 mL | INTRAMUSCULAR | Status: AC
  Administered 2013-02-11: 0.5 mL via INTRAMUSCULAR
  Filled 2013-02-10: qty 0.5

## 2013-02-10 MED ORDER — FLEET ENEMA 7-19 GM/118ML RE ENEM
1.0000 | ENEMA | Freq: Every day | RECTAL | Status: DC | PRN
  Administered 2013-02-10: 1 via RECTAL
  Filled 2013-02-10 (×2): qty 1

## 2013-02-10 MED ORDER — SODIUM CHLORIDE 0.9 % IV SOLN
INTRAVENOUS | Status: DC
  Administered 2013-02-10 – 2013-02-11 (×3): via INTRAVENOUS

## 2013-02-10 NOTE — Progress Notes (Signed)
[facility] PROGRESS NOTE  Gina Espinoza ZOX:[id number] DOB: February 03, 1961 DOA: 02/07/2013 PCP: Gina Herter, MD Brief HPI: [name] is a 52 y.o. female with history of fibromyalgia on chronic pain medication including oral dilaudid and morphine IR and 12 hour tabs as well as multiple muscle relaxers per EMR, anxiety on benzodiazepines, insomnia on restoril, history of chronic chest pain presumed to be Coronary spasm on norvasc. Presenting with complaints of decrease oral intake for the last 6 weeks. She states that within the last week it has got worse and the problem has been persistent. Nothing she is aware of makes it better.  Denies any diarrhea. Does complain of suprapubic tenderness but denies any dysuria  In the ED patient was found to have sinus bradycardia, leukopenia, and hypokalemia  Assessment/Plan:  1. Decrease in appetite with weight loss of greater than 20 lbs in the last 3 to 4 months. Associated with abdominal pain on eating.  unclear etiology.  - At this point will hold bentyl and levbid  - suspect malnutrition and nutrition consulted.  - she never underwent EGD , colonoscopy in the past, apparently normal as per patient. work up for anemia. Stool for occult blood ordered.  - GI consulted and she underwent EGD on 9/21 , showed distal gastritis and retained food int he stomach, which was probably from the chronic use of narcotics slowing the bowel motility. Biopsies were taken and recommendations to follow up as outpatient.  2. Sinus bradycardia  - asymptomatic. No dizziness or lightheadedness. Resolved.  - monitor on telemetry .  TSH normal.  -  3. UTI  Urine cultures negative and rocephin discontinued.  4. Fibromyalgia  - continue home regimen dilaudid oral for severe pain  - morphine IR and morphine 12 hour tabs. Will not cover with any IV opiods  - Patient reports that opiods cause her to have nausea and as such she requires taking antiemetic prior. Her  pain regimen may be contributing to # 1 but she reports being in pain all the time.  5. Depression  - continue home regimen  - May also be contributing to # 1 if not well controlled, another diagnosis to consider should patient's condition not improve.  6. Hypokalemia  - repleted. - replace orally.  - reassess next am  7. HPL  - continue statin  8. DVT prophylaxis.   9. Constipation: moderate stool on CT abdomen and pelvis.  - started on colace and miralax.  - started her on MOM.  - tried fleet and dis impaction, did not work.  - abd film ordered for further evaluation.   10. Abdominal pain: possibly from the bladder spasms and urinary retention. Which has improved after I&O cath, where 1300 ml fluid came out. Her fluids were stopped , we restarted them , as she has very decreased po intake. Will order nutrition consult.      Code Status: full code Family Communication: none at bedside Disposition Plan: pending.    Consultants:  GI consult.   Procedures: EGD showed distal gastritis. Antibiotics:  none  HPI/Subjective: abd cramping on eating. Very anxious.  Had urinary retention, I&O CATH urine out.   Objective: Filed Vitals:   02/10/13 0951  BP: 151/71  Pulse:   Temp:   Resp:     Intake/Output Summary (Last 24 hours) at 02/10/13 1203 Last data filed at 02/10/13 0600  Gross per 24 hour  Intake   1440 ml  Output    200 ml  Net   1240 ml   Filed Weights   02/07/13 1825 02/07/13 2047 02/09/13 0517  Weight: 63.368 kg (139 lb 11.2 oz) 59.648 kg (131 lb 8 oz) 63.05 kg (139 lb)    Exam:   General:  alerta febrile comfortable,   Cardiovascular: s1s2  Respiratory: ctab  Abdomen: soft NT ND BS+  Musculoskeletal: no pedal edema.   Data Reviewed: Basic Metabolic Panel:  Recent Labs Lab 02/07/13 0841 02/07/13 1908 02/08/13 0430  NA 137  --  139  K 3.4*  --  3.7  CL 98  --  103  CO2 25  --  30  GLUCOSE 88  --  103*  BUN 10  --  5*   CREATININE 0.84 0.90 0.89  CALCIUM 9.7  --  8.6  MG  --  2.1  --   PHOS  --  3.8  --    Liver Function Tests:  Recent Labs Lab 02/07/13 0841  AST 22  ALT 17  ALKPHOS 94  BILITOT 0.6  PROT 7.7  ALBUMIN 4.2    Recent Labs Lab 02/07/13 0841  LIPASE 34   No results found for this basename: AMMONIA,  in the last 168 hours CBC:  Recent Labs Lab 02/07/13 0841 02/07/13 1908 02/08/13 0430  WBC 3.4* 3.3* 2.9*  NEUTROABS 1.6*  --   --   HGB 11.8* 11.2* 10.5*  HCT 35.3* 34.1* 32.1*  MCV 77.8* 78.2 78.9  PLT 224 222 204   Cardiac Enzymes: No results found for this basename: CKTOTAL, CKMB, CKMBINDEX, TROPONINI,  in the last 168 hours BNP (last 3 results) No results found for this basename: PROBNP,  in the last 8760 hours CBG: No results found for this basename: GLUCAP,  in the last 168 hours  Recent Results (from the past 240 hour(s))  URINE CULTURE     Status: None   Collection Time    02/08/13  4:39 PM      Result Value Range Status   Specimen Description URINE, RANDOM   Final   Special Requests NONE   Final   Culture  Setup Time     Final   Value: 02/08/2013 19:17     Performed at [facility] Count     Final   Value: NO GROWTH     Performed at [facility]   Culture     Final   Value: NO GROWTH     Performed at [facility]   Report Status 02/09/2013 FINAL   Final     Studies: No results found.  Scheduled Meds: . amLODipine  2.5 mg Oral Daily  . atorvastatin  10 mg Oral q1800  . cholecalciferol  1,000 Units Oral BID  . escitalopram  20 mg Oral Daily  . feeding supplement  237 mL Oral TID BM  . ferrous sulfate  325 mg Oral Q breakfast  . gabapentin  100 mg Oral TID  . heparin  5,000 Units Subcutaneous Q8H  . [START ON 02/11/2013] influenza vac split quadrivalent PF  0.5 mL Intramuscular Tomorrow-1000  . pantoprazole  40 mg Oral Q0600  . phenazopyridine  100 mg Oral TID WC  . polyethylene glycol  17 g Oral BID  .  senna-docusate  1 tablet Oral BID  . sodium chloride  3 mL Intravenous Q12H  . temazepam  30 mg Oral QHS   Continuous Infusions:    Principal Problem:   Decrease in appetite Active Problems:  HYPERLIPIDEMIA-MIXED   HYPERTENSION, UNSPECIFIED   ANGINA, HX OF   Fibromyalgia syndrome   Depression   UTI (lower urinary tract infection)   Hypokalemia   Sinus bradycardia   Unspecified gastritis and gastroduodenitis without mention of hemorrhage   Protein-calorie malnutrition, severe    Time spent: 25  Minutes.    [facility]  [facility] Pager [phone]. If 7PM-7AM, please contact night-coverage at [email or web address], password [facility] 02/10/2013, 12:03 PM  LOS: 3 days

## 2013-02-10 NOTE — Progress Notes (Signed)
Gina Espinoza Daily Rounding Note 02/10/2013, 8:27 AM  SUBJECTIVE:       Still depressed and c/o intestinal pain.  No BMs despite laxatives.  Tolerating Ensure but not eating many solids  OBJECTIVE:         Vital signs in last 24 hours:    Temp:  [98.1 F (36.7 C)-98.7 F (37.1 C)] 98.1 F (36.7 C) (09/22 0553) Pulse Rate:  [51-74] 57 (09/22 0558) Resp:  [9-46] 17 (09/22 0553) BP: (97-149)/(56-79) 128/70 mmHg (09/22 0558) SpO2:  [92 %-100 %] 92 % (09/22 0553) Last BM Date: 02/09/13 General: depressed sleeping at noon in darkened room.  Heart: rrr Chest: clear bil Abdomen: soft, not distended, BS hypoactive.  Diffuse tenderness to mild touch, no G or R  Extremities: no pedal edema Neuro/Psych:  Flat affect. Depressed.  No agitation.   Intake/Output from previous day: 09/21 0701 - 09/22 0700 In: 1440 [P.O.:240; I.V.:1200] Out: 320 [Urine:320]  Intake/Output this shift:    Lab Results:  Recent Labs  02/07/13 0841 02/07/13 1908 02/08/13 0430  WBC 3.4* 3.3* 2.9*  HGB 11.8* 11.2* 10.5*  HCT 35.3* 34.1* 32.1*  PLT 224 222 204   BMET  Recent Labs  02/07/13 0841 02/07/13 1908 02/08/13 0430  NA 137  --  139  K 3.4*  --  3.7  CL 98  --  103  CO2 25  --  30  GLUCOSE 88  --  103*  BUN 10  --  5*  CREATININE 0.84 0.90 0.89  CALCIUM 9.7  --  8.6   LFT  Recent Labs  02/07/13 0841  PROT 7.7  ALBUMIN 4.2  AST 22  ALT 17  ALKPHOS 94  BILITOT 0.6    ASSESMENT: *  Weight loss, nausea, postprandial abdominal pain EGD 9/23:  Retained gastric contents c/w gastroparesis, gastritis. Pt on chronic narcotics and toradol po. Tolerating ensure, not eating much in way of solids.  *  Chronic severe constipation. Started on BID Miralax 9/21. Also getting Senokot.  Still no BMs.  Says her intestines hurt.  No evidence of intestinal pathology or obstruction on CT scan of 9/19: show some calcified lymph nodes in pelvis and right iliac vein region.   *  Fibromyalgia.   Chronic pain syndrome *  Depression.  Unable to find psychiatrist willing to take Medicaid. Would [facility] services be available to her? She needs help with her chronic depression. I think this and the FM are contributing significantly to her Espinoza pain.  *  Dysuria, no growth on urine culture.  *  Normocytic anemia.  Not Iron deficient. Ferritin normal.  Still getting po Iron   PLAN: *  Stop or at least limit use of narcotics and Toradol.   *  I stopped the po iron, it is likely adding to her intestinal complaints and constipation. Continue laxatives.  *  Continue once daily PPI, omeprazole or other generic is fine *  Follow up and treat if H. pylori +   *  Stop Rocephin with the no growth urine.  *  Added heating pad for abdominal pain.  *  Temazepam is constipating, might want to consider stopping this. Apparently Remus Loffler is effective for this pt for her insomnia.  *  Has 10/17 ROV with Dr Christella Hartigan.    LOS: 3 days   Gina Espinoza  02/10/2013, 8:27 AM Pager: [phone]    Attending physician's note   I have taken an interval history, reviewed  the chart and examined the patient. I agree with the Advanced Practitioner's note, impression and recommendations. Abdominal pain likely multifactorial from gastroparesis, constipation, fibromyalgia, depression. Continue measures as outlined above. Outpatient evaluation of heme + stool with Dr. Christella Hartigan is planned.  Venita Lick. Russella Dar, MD [facility]

## 2013-02-11 ENCOUNTER — Inpatient Hospital Stay (HOSPITAL_COMMUNITY): Payer: Medicare Other

## 2013-02-11 DIAGNOSIS — R109 Unspecified abdominal pain: Secondary | ICD-10-CM | POA: Diagnosis not present

## 2013-02-11 DIAGNOSIS — E860 Dehydration: Secondary | ICD-10-CM | POA: Diagnosis not present

## 2013-02-11 DIAGNOSIS — N289 Disorder of kidney and ureter, unspecified: Secondary | ICD-10-CM | POA: Diagnosis not present

## 2013-02-11 DIAGNOSIS — K5900 Constipation, unspecified: Secondary | ICD-10-CM | POA: Diagnosis not present

## 2013-02-11 DIAGNOSIS — R630 Anorexia: Secondary | ICD-10-CM | POA: Diagnosis not present

## 2013-02-11 LAB — BASIC METABOLIC PANEL
BUN: 14 mg/dL (ref 6–23)
CO2: 30 mEq/L (ref 19–32)
Calcium: 9 mg/dL (ref 8.4–10.5)
Chloride: 99 mEq/L (ref 96–112)
Creatinine, Ser: 1.52 mg/dL — ABNORMAL HIGH (ref 0.50–1.10)
Glucose, Bld: 104 mg/dL — ABNORMAL HIGH (ref 70–99)
Potassium: 4.1 mEq/L (ref 3.5–5.1)
Sodium: 139 mEq/L (ref 135–145)

## 2013-02-11 LAB — CBC
HCT: 33.6 % — ABNORMAL LOW (ref 36.0–46.0)
Hemoglobin: 10.9 g/dL — ABNORMAL LOW (ref 12.0–15.0)
MCH: 25.8 pg — ABNORMAL LOW (ref 26.0–34.0)
MCV: 79.4 fL (ref 78.0–100.0)
RBC: 4.23 MIL/uL (ref 3.87–5.11)
RDW: 15.6 % — ABNORMAL HIGH (ref 11.5–15.5)

## 2013-02-11 MED ORDER — ZOLPIDEM TARTRATE 5 MG PO TABS: 5.0000 mg | ORAL_TABLET | Freq: Every evening | ORAL | Status: DC | PRN

## 2013-02-11 NOTE — Progress Notes (Signed)
NUTRITION FOLLOW UP  Intervention:   1. Change to Regular diet, pt is sending back heart healthy meals.   2. Continue Ensure   3. Would pt benefit from a pro-kinetic agent?   Nutrition Dx:   Malnutrition related to inadequate oral intake as evidenced by 22% weight loss in the past 6 months with intake </= 50% of estimated energy requirement for >/= 5 days and moderate depletion of muscle and fat mass.  Goal:   Intake to meet >90% of estimated nutrition needs.  Monitor:   PO intake, labs, weight trend.  Assessment:   Pt continues to eat poorly. Per pt, food doesn't have any flavor. Also reported constipation. Eating banana, advised to avoid bananas as can cause increased constipation for some people. Spoke with MD, will change to regular diet.   Noted results of EGD. Pt with retained stomach contents. Question if pt would benefit from pro-kinetic agent?   Height: Ht Readings from Last 1 Encounters:  02/07/13 5\' 6"  (1.676 m)    Weight Status:   Wt Readings from Last 1 Encounters:  02/11/13 141 lb 12.8 oz (64.32 kg)    Re-estimated needs:  Kcal: 1700-1900  Protein: 85-100 gm  Fluid: 1.7-2 L  Skin: intact   Diet Order: Cardiac   Intake/Output Summary (Last 24 hours) at 02/11/13 1116 Last data filed at 02/11/13 0616  Gross per 24 hour  Intake 409.25 ml  Output   2700 ml  Net -2290.75 ml    Last BM: 9/23   Labs:   Recent Labs Lab 02/07/13 0841 02/07/13 1908 02/08/13 0430 02/11/13 0510  NA 137  --  139 139  K 3.4*  --  3.7 4.1  CL 98  --  103 99  CO2 25  --  30 30  BUN 10  --  5* 14  CREATININE 0.84 0.90 0.89 1.52*  CALCIUM 9.7  --  8.6 9.0  MG  --  2.1  --   --   PHOS  --  3.8  --   --   GLUCOSE 88  --  103* 104*    CBG (last 3)  No results found for this basename: GLUCAP,  in the last 72 hours  Scheduled Meds: . amLODipine  2.5 mg Oral Daily  . atorvastatin  10 mg Oral q1800  . cholecalciferol  1,000 Units Oral BID  . escitalopram  20 mg  Oral Daily  . feeding supplement  237 mL Oral TID BM  . gabapentin  100 mg Oral TID  . heparin  5,000 Units Subcutaneous Q8H  . hydrocortisone-pramoxine  1 applicator Rectal BID  . pantoprazole  40 mg Oral Q0600  . phenazopyridine  100 mg Oral TID WC  . polyethylene glycol  17 g Oral BID  . senna-docusate  1 tablet Oral BID  . sodium chloride  3 mL Intravenous Q12H    Continuous Infusions: . sodium chloride 75 mL/hr at 02/11/13 0843    Isabell Jarvis RD, LDN Pager [phone] After Hours pager [phone]

## 2013-02-11 NOTE — Progress Notes (Signed)
Pt has been unable to void. Bladder scan showed > . On call NP notified and gave orders to catheterize PRN if unable to void. In & out catheterized pt and got out 1350 ml. Urine was orange with some sediment.

## 2013-02-11 NOTE — Progress Notes (Signed)
14 French catheter inserted with two nurses present. Patient tolerated well.  Obtained 500 mL of concentrated urine.

## 2013-02-11 NOTE — Evaluation (Signed)
Physical Therapy Evaluation Patient Details Name: Gina Espinoza MRN: [id number] DOB: 08-May-1961 Today's Date: 02/11/2013 Time: 4098-1191 PT Time Calculation (min): 15 min  PT Assessment / Plan / Recommendation History of Present Illness  Gina Espinoza is a 52 y.o. female with history of fibromyalgia on chronic pain medication. Presenting with complaints of decrease oral intake for the last 6 weeks. In the ED patient was found to have sinus bradycardia, leukopenia, and hypokalemia. Pt also having urinary retention.  Clinical Impression  Pt admitted with above. Pt currently with functional limitations due to the deficits listed below (see PT Problem List).  Pt will benefit from skilled PT to increase their independence and safety with mobility to allow discharge to the venue listed below.       PT Assessment  Patient needs continued PT services    Follow Up Recommendations  SNF    Does the patient have the potential to tolerate intense rehabilitation      Barriers to Discharge Decreased caregiver support      Equipment Recommendations  Rolling walker with 5" wheels    Recommendations for Other Services     Frequency Min 3X/week    Precautions / Restrictions Precautions Precautions: Fall   Pertinent Vitals/Pain No specific c/o's with mobility.      Mobility  Bed Mobility Bed Mobility: Sit to Supine Supine to Sit: 4: Min assist Sitting - Scoot to Edge of Bed: 5: Supervision Sit to Supine: 5: Supervision Details for Bed Mobility Assistance: Assist to bring trunk up Transfers Transfers: Sit to Stand;Stand to Sit Sit to Stand: 4: Min assist;With upper extremity assist;From bed Stand to Sit: 4: Min guard;With upper extremity assist;To bed Details for Transfer Assistance: Verbal cues for hand placement Ambulation/Gait Ambulation/Gait Assistance: 4: Min assist Ambulation Distance (Feet): 8 Feet Assistive device: Rolling walker Ambulation/Gait Assistance Details: Verbal  cues to stand more erect and to incr step length. Gait Pattern: Step-to pattern;Decreased step length - right;Decreased step length - left;Shuffle;Trunk flexed;Narrow base of support Gait velocity: decr    Exercises     PT Diagnosis: Difficulty walking;Generalized weakness  PT Problem List: Decreased strength;Decreased activity tolerance;Decreased balance;Decreased mobility;Decreased knowledge of use of DME PT Treatment Interventions: DME instruction;Gait training;Functional mobility training;Therapeutic activities;Therapeutic exercise;Balance training;Patient/family education     PT Goals(Current goals can be found in the care plan section) Acute Rehab PT Goals Patient Stated Goal: To get where she can do more. PT Goal Formulation: With patient Time For Goal Achievement: 02/18/13 Potential to Achieve Goals: Good  Visit Information  Last PT Received On: 02/11/13 Assistance Needed: +1 History of Present Illness: Gina Espinoza is a 52 y.o. female with history of fibromyalgia on chronic pain medication. Presenting with complaints of decrease oral intake for the last 6 weeks. In the ED patient was found to have sinus bradycardia, leukopenia, and hypokalemia. Pt also having urinary retention.       Prior Functioning  Home Living Family/patient expects to be discharged to:: Private residence Living Arrangements: Children Available Help at Discharge: Family;Available PRN/intermittently Type of Home: House Home Access: Ramped entrance Home Layout: Two level Alternate Level Stairs-Number of Steps: chair lift Home Equipment: Cane - quad Prior Function Level of Independence: Independent with assistive device(s) Comments: amb with quad cane Communication Communication: No difficulties    Cognition  Cognition Arousal/Alertness: Awake/alert Behavior During Therapy: Flat affect Overall Cognitive Status: Within Functional Limits for tasks assessed    Extremity/Trunk Assessment Upper  Extremity Assessment Upper Extremity Assessment: Generalized weakness  Lower Extremity Assessment Lower Extremity Assessment: Generalized weakness   Balance Balance Balance Assessed: Yes Static Standing Balance Static Standing - Balance Support: Bilateral upper extremity supported Static Standing - Level of Assistance: 5: Stand by assistance  End of Session PT - End of Session Activity Tolerance: Patient limited by fatigue Patient left: in bed;with call bell/phone within reach Nurse Communication: Mobility status  GP     [facility] 02/11/2013, 1:49 PM  [facility] PT [phone]

## 2013-02-11 NOTE — Progress Notes (Addendum)
[facility] PROGRESS NOTE  GWENDALYNN ECKSTROM WJX:[id number] DOB: 13-Dec-1960 DOA: 02/07/2013 PCP: Carollee Herter, MD Brief HPI: Gina Espinoza is a 52 y.o. female with history of fibromyalgia on chronic pain medication including oral dilaudid and morphine IR and 12 hour tabs as well as multiple muscle relaxers per EMR, anxiety on benzodiazepines, insomnia on restoril, history of chronic chest pain presumed to be Coronary spasm on norvasc. Presenting with complaints of decrease oral intake for the last 6 weeks. She states that within the last week it has got worse and the problem has been persistent. Nothing she is aware of makes it better.  Denies any diarrhea. Does complain of suprapubic tenderness but denies any dysuria    Assessment/Plan:  1. Decrease in appetite with weight loss of greater than 20 lbs in the last 3 to 4 months. Associated with abdominal pain on eating.  unclear etiology.  - At this point will hold bentyl and levbid  - suspect malnutrition and nutrition consulted.  - she never underwent EGD , colonoscopy in the past, apparently normal as per patient. work up for anemia. Stool for occult blood ordered.  - GI consulted and she underwent EGD on 9/21 , showed distal gastritis and retained food int he stomach, which was probably from the chronic use of narcotics slowing the bowel motility. Biopsies were taken and recommendations to follow up as outpatient.  2. Sinus bradycardia  - asymptomatic. No dizziness or lightheadedness. Resolved.  - monitor on telemetry .  TSH normal.  -  3. UTI  Urine cultures negative and rocephin discontinued.  4. Fibromyalgia  - continue home regimen dilaudid oral for severe pain  - morphine IR and morphine 12 hour tabs. Will not cover with any IV opiods  - Patient reports that opiods cause her to have nausea and as such she requires taking antiemetic prior. Her pain regimen may be contributing to # 1 but she reports being in pain all the  time.  5. Depression  - continue home regimen  - May also be contributing to # 1 if not well controlled, another diagnosis to consider should patient's condition not improve.  6. Hypokalemia  - repleted. - replace orally.  - reassess next am  7. HPL  - continue statin  8. DVT prophylaxis.   9. Constipation: moderate stool on CT abdomen and pelvis.  - started on colace and miralax and MOM.  - tried fleet and dis impaction, did not work.  - abd film showed moderate constipation. Encouraged to get OOB to chair and participate int herapy and decrease the narcotic use.   10. Abdominal pain: possibly from the bladder spasms and urinary retention. Which has improved after I&O cath, where 1300 ml fluid came out on 9/22. And multiple times today. Hence foley cathter placed.   11. Acute renal failure: possibly from urinary retention. US RENAL ordered and pending.      Code Status: full code Family Communication: none at bedside Disposition Plan: pendingCIR EVALUATION.   Consultants:  GI consult.   Procedures: EGD showed distal gastritis. Antibiotics:  none  HPI/Subjective: abd cramping on eating. Very anxious.  Had urinary retention, I&O CATH ,multiple times, today.   Objective: Filed Vitals:   02/11/13 1300  BP: 120/76  Pulse: 74  Temp: 98.3 F (36.8 C)  Resp: 18    Intake/Output Summary (Last 24 hours) at 02/11/13 1925 Last data filed at 02/11/13 1900  Gross per 24 hour  Intake   1990 ml  Output   3250 ml  Net  -1260 ml   Filed Weights   02/07/13 2047 02/09/13 0517 02/11/13 0446  Weight: 59.648 kg (131 lb 8 oz) 63.05 kg (139 lb) 64.32 kg (141 lb 12.8 oz)    Exam:   General:  alerta febrile comfortable,   Cardiovascular: s1s2  Respiratory: ctab  Abdomen: soft NT ND BS+  Musculoskeletal: no pedal edema.   Data Reviewed: Basic Metabolic Panel:  Recent Labs Lab 02/07/13 0841 02/07/13 1908 02/08/13 0430 02/11/13 0510  NA 137  --  139 139  K  3.4*  --  3.7 4.1  CL 98  --  103 99  CO2 25  --  30 30  GLUCOSE 88  --  103* 104*  BUN 10  --  5* 14  CREATININE 0.84 0.90 0.89 1.52*  CALCIUM 9.7  --  8.6 9.0  MG  --  2.1  --   --   PHOS  --  3.8  --   --    Liver Function Tests:  Recent Labs Lab 02/07/13 0841  AST 22  ALT 17  ALKPHOS 94  BILITOT 0.6  PROT 7.7  ALBUMIN 4.2    Recent Labs Lab 02/07/13 0841  LIPASE 34   No results found for this basename: AMMONIA,  in the last 168 hours CBC:  Recent Labs Lab 02/07/13 0841 02/07/13 1908 02/08/13 0430 02/11/13 0510  WBC 3.4* 3.3* 2.9* 5.3  NEUTROABS 1.6*  --   --   --   HGB 11.8* 11.2* 10.5* 10.9*  HCT 35.3* 34.1* 32.1* 33.6*  MCV 77.8* 78.2 78.9 79.4  PLT 224 222 204 203   Cardiac Enzymes: No results found for this basename: CKTOTAL, CKMB, CKMBINDEX, TROPONINI,  in the last 168 hours BNP (last 3 results) No results found for this basename: PROBNP,  in the last 8760 hours CBG: No results found for this basename: GLUCAP,  in the last 168 hours  Recent Results (from the past 240 hour(s))  URINE CULTURE     Status: None   Collection Time    02/08/13  4:39 PM      Result Value Range Status   Specimen Description URINE, RANDOM   Final   Special Requests NONE   Final   Culture  Setup Time     Final   Value: 02/08/2013 19:17     Performed at [facility] Count     Final   Value: NO GROWTH     Performed at [facility]   Culture     Final   Value: NO GROWTH     Performed at [facility]   Report Status 02/09/2013 FINAL   Final     Studies: Dg Abd 2 Views  02/10/2013   CLINICAL DATA:  Abdominal pain and constipation.  EXAM: ABDOMEN - 2 VIEW  COMPARISON:  CT abdomen and pelvis 02/07/2013.  FINDINGS: No free intraperitoneal air or evidence of small bowel obstruction is identified. Contrast from the patient's CT scan is now in the ascending and transverse colon. Moderate stool burden is seen in the ascending and transverse  colon these segments of the colon are mildly dilated. No abnormal abdominal calcification is seen.  IMPRESSION: Negative for free air or small bowel obstruction.  Moderate stool burden ascending and transverse colon. Dilatation of the colon is suggestive of ileus.   Electronically Signed   By: Drusilla Kanner M.D.   On: 02/10/2013 21:56  Scheduled Meds: . amLODipine  2.5 mg Oral Daily  . atorvastatin  10 mg Oral q1800  . cholecalciferol  1,000 Units Oral BID  . escitalopram  20 mg Oral Daily  . feeding supplement  237 mL Oral TID BM  . gabapentin  100 mg Oral TID  . heparin  5,000 Units Subcutaneous Q8H  . hydrocortisone-pramoxine  1 applicator Rectal BID  . pantoprazole  40 mg Oral Q0600  . polyethylene glycol  17 g Oral BID  . senna-docusate  1 tablet Oral BID  . sodium chloride  3 mL Intravenous Q12H   Continuous Infusions: . sodium chloride 75 mL/hr at 02/11/13 1610    Principal Problem:   Decrease in appetite Active Problems:   HYPERLIPIDEMIA-MIXED   HYPERTENSION, UNSPECIFIED   ANGINA, HX OF   Fibromyalgia syndrome   Depression   UTI (lower urinary tract infection)   Hypokalemia   Sinus bradycardia   Unspecified gastritis and gastroduodenitis without mention of hemorrhage   Protein-calorie malnutrition, severe   Abdominal  pain, other specified site   Unspecified constipation   Gastroparesis    Time spent: 25  Minutes.    [facility]  [facility] Pager [phone]. If 7PM-7AM, please contact night-coverage at [email or web address], password [facility] 02/11/2013, 7:25 PM  LOS: 4 days

## 2013-02-12 DIAGNOSIS — I209 Angina pectoris, unspecified: Secondary | ICD-10-CM | POA: Diagnosis not present

## 2013-02-12 DIAGNOSIS — F329 Major depressive disorder, single episode, unspecified: Secondary | ICD-10-CM | POA: Diagnosis not present

## 2013-02-12 DIAGNOSIS — IMO0001 Reserved for inherently not codable concepts without codable children: Secondary | ICD-10-CM | POA: Diagnosis not present

## 2013-02-12 DIAGNOSIS — R51 Headache: Secondary | ICD-10-CM | POA: Diagnosis not present

## 2013-02-12 DIAGNOSIS — E785 Hyperlipidemia, unspecified: Secondary | ICD-10-CM | POA: Diagnosis not present

## 2013-02-12 DIAGNOSIS — F411 Generalized anxiety disorder: Secondary | ICD-10-CM | POA: Diagnosis not present

## 2013-02-12 DIAGNOSIS — R630 Anorexia: Secondary | ICD-10-CM | POA: Diagnosis not present

## 2013-02-12 DIAGNOSIS — R339 Retention of urine, unspecified: Secondary | ICD-10-CM | POA: Diagnosis not present

## 2013-02-12 DIAGNOSIS — K5900 Constipation, unspecified: Secondary | ICD-10-CM | POA: Diagnosis not present

## 2013-02-12 DIAGNOSIS — K3184 Gastroparesis: Secondary | ICD-10-CM | POA: Diagnosis not present

## 2013-02-12 DIAGNOSIS — R5381 Other malaise: Secondary | ICD-10-CM | POA: Diagnosis not present

## 2013-02-12 DIAGNOSIS — I498 Other specified cardiac arrhythmias: Secondary | ICD-10-CM | POA: Diagnosis not present

## 2013-02-12 DIAGNOSIS — K2960 Other gastritis without bleeding: Secondary | ICD-10-CM | POA: Diagnosis not present

## 2013-02-12 DIAGNOSIS — E876 Hypokalemia: Secondary | ICD-10-CM | POA: Diagnosis not present

## 2013-02-12 DIAGNOSIS — R109 Unspecified abdominal pain: Secondary | ICD-10-CM | POA: Diagnosis not present

## 2013-02-12 DIAGNOSIS — I10 Essential (primary) hypertension: Secondary | ICD-10-CM | POA: Diagnosis not present

## 2013-02-12 DIAGNOSIS — N390 Urinary tract infection, site not specified: Secondary | ICD-10-CM | POA: Diagnosis not present

## 2013-02-12 LAB — BASIC METABOLIC PANEL
BUN: 9 mg/dL (ref 6–23)
CO2: 33 mEq/L — ABNORMAL HIGH (ref 19–32)
Calcium: 8.7 mg/dL (ref 8.4–10.5)
GFR calc Af Amer: 64 mL/min — ABNORMAL LOW (ref >90)
GFR calc non Af Amer: 55 mL/min — ABNORMAL LOW (ref >90)
Glucose, Bld: 109 mg/dL — ABNORMAL HIGH (ref 70–99)
Potassium: 4.1 mEq/L (ref 3.5–5.1)

## 2013-02-12 LAB — URINE CULTURE
Colony Count: NO GROWTH
Culture: NO GROWTH

## 2013-02-12 LAB — GLUCOSE, CAPILLARY

## 2013-02-12 MED ORDER — HYDROMORPHONE HCL 2 MG PO TABS: 2.0000 mg | ORAL_TABLET | ORAL | Status: DC | PRN

## 2013-02-12 MED ORDER — PANTOPRAZOLE SODIUM 40 MG PO TBEC: 40.0000 mg | DELAYED_RELEASE_TABLET | Freq: Every day | ORAL | Status: DC

## 2013-02-12 MED ORDER — POLYETHYLENE GLYCOL 3350 17 G PO PACK: 17.0000 g | PACK | Freq: Two times a day (BID) | ORAL | Status: AC

## 2013-02-12 MED ORDER — ZOLPIDEM TARTRATE 10 MG PO TABS: 10.0000 mg | ORAL_TABLET | ORAL | Status: DC

## 2013-02-12 MED ORDER — DOCUSATE SODIUM 100 MG PO CAPS: 100.0000 mg | ORAL_CAPSULE | Freq: Two times a day (BID) | ORAL | Status: DC

## 2013-02-12 MED ORDER — HYDROCODONE-ACETAMINOPHEN 5-325 MG PO TABS: 1.0000 | ORAL_TABLET | Freq: Four times a day (QID) | ORAL | Status: DC | PRN

## 2013-02-12 MED ORDER — ALPRAZOLAM 1 MG PO TABS: 1.0000 mg | ORAL_TABLET | Freq: Every day | ORAL | Status: DC | PRN

## 2013-02-12 MED ORDER — SENNOSIDES-DOCUSATE SODIUM 8.6-50 MG PO TABS: 1.0000 | ORAL_TABLET | Freq: Two times a day (BID) | ORAL | Status: DC

## 2013-02-12 MED ORDER — MORPHINE SULFATE ER 15 MG PO TBCR: 15.0000 mg | EXTENDED_RELEASE_TABLET | Freq: Two times a day (BID) | ORAL | Status: DC | PRN

## 2013-02-12 MED ORDER — ENSURE COMPLETE PO LIQD: 237.0000 mL | Freq: Three times a day (TID) | ORAL | Status: DC

## 2013-02-12 NOTE — Progress Notes (Signed)
This RN received a return call from [facility] regarding patient post discharge.   Patient amenable to discharge at the time patient was picked up by ambulance.  However, this RN received a phone call from receiving facility that patient is insisting that she was not supposed to be at the facility.  Patient states she was given choice while she was under the influence of narcotics.  However, social work explained to this Charity fundraiser prior to discharge that the patient was given choice of facility and the patient chose [address] of [place].  This RN went to check on patient prior to discharge and she did not mention going home, but expressed understanding of discharge to facility with no complaints.  Receiving facility also called to clarify instructions on foley catheter.  Physician requested to keep foley for 48 hours at the facility and then begin voiding trial.  Receiving RN expresses understanding and read back instructions.

## 2013-02-12 NOTE — Clinical Social Work Placement (Signed)
Clinical Social Work Department CLINICAL SOCIAL WORK PLACEMENT NOTE 02/12/2013  Patient:  BONNIE, OVERDORF  Account Number:  [id number] Admit date:  02/07/2013  Clinical Social Worker:  Cherre Blanc, [place]  Date/time:  02/11/2013 11:00 AM  Clinical Social Work is seeking post-discharge placement for this patient at the following level of care:   SKILLED NURSING   (*CSW will update this form in Epic as items are completed)   02/11/2013  Patient/family provided with [facility] Department of Clinical Social Work's list of facilities offering this level of care within the geographic area requested by the patient (or if unable, by the patient's family).  02/11/2013  Patient/family informed of their freedom to choose among providers that offer the needed level of care, that participate in Medicare, Medicaid or managed care program needed by the patient, have an available bed and are willing to accept the patient.  02/11/2013  Patient/family informed of [facility]' ownership interest in [facility], as well as of the fact that they are under no obligation to receive care at this facility.  PASARR submitted to EDS on 02/11/2013 PASARR number received from EDS on 02/11/2013  FL2 transmitted to all facilities in geographic area requested by pt/family on  02/11/2013 FL2 transmitted to all facilities within larger geographic area on   Patient informed that his/her managed care company has contracts with or will negotiate with  certain facilities, including the following:     Patient/family informed of bed offers received:   Patient chooses bed at  Physician recommends and patient chooses bed at    Patient to be transferred to  on   Patient to be transferred to facility by   The following physician request were entered in Epic:   Additional Comments:   Roddie Mc, [place], [place], [phone]

## 2013-02-12 NOTE — Progress Notes (Signed)
Report given to [facility] [place]

## 2013-02-12 NOTE — Progress Notes (Addendum)
[facility] Daily Rounding Note  02/12/2013, 10:32 AM  SUBJECTIVE:       Note urinary retention of > one liter yest AM.  Foley now in place. Still with c/o lower abdominal pain. Pain not much better after foley.  Some relief with heating pad.  Note Proctofoam added by Dr Blake Divine.  Pt used 4 mg of Dilaudid yesterday.  Did not use the po Morphine. .  No rectal bleeding.  Watery stools after enemas a few days ago.     OBJECTIVE:         Vital signs in last 24 hours:    Temp:  [98.3 F (36.8 C)-98.7 F (37.1 C)] 98.6 F (37 C) (09/24 0945) Pulse Rate:  [54-75] 63 (09/24 0945) Resp:  [16-18] 18 (09/24 0945) BP: (104-120)/(57-76) 110/60 mmHg (09/24 1023) SpO2:  [96 %-100 %] 100 % (09/24 0945) Last BM Date: 02/11/13 General: depressed, awake.  NAD   Heart: RRR Chest: clear bil Abdomen: soft, ND, tender in mid to lower abdomen with minor pressure.  BS hypoactive  Extremities: no CCE Neuro/Psych:  Depressed. Oriented x 3.   Intake/Output from previous day: 09/23 0701 - 09/24 0700 In: 2851.3 [I.V.:2841.3] Out: 2850 [Urine:2850]  Intake/Output this shift: Total I/O In: 387 [P.O.:237; I.V.:150] Out: 425 [Urine:425]  Lab Results:  Recent Labs  02/11/13 0510  WBC 5.3  HGB 10.9*  HCT 33.6*  PLT 203   BMET  Recent Labs  02/11/13 0510 02/12/13 0441  NA 139 141  K 4.1 4.1  CL 99 103  CO2 30 33*  GLUCOSE 104* 109*  BUN 14 9  CREATININE 1.52* 1.13*  CALCIUM 9.0 8.7   Studies/Results: US Renal  02/12/2013   CLINICAL DATA:  Acute renal failure.  EXAM: RENAL/URINARY TRACT ULTRASOUND COMPLETE  COMPARISON:  CT abdomen and pelvis 02/07/2013.  FINDINGS: Right Kidney  Length: 10.9 cm. Demonstrates mildly increased cortical echogenicity. No stone, mass or hydronephrosis.  Left Kidney  Length: Measures 9.0 cm. Demonstrates mildly increased cortical echogenicity. No stone, mass or hydronephrosis.  Bladder:  Decompressed with a Foley catheter in place.  IMPRESSION: Negative for  hydronephrosis.  Mildly increased cortical echogenicity compatible with medical renal disease.   Electronically Signed   By: Drusilla Kanner M.D.   On: 02/12/2013 00:18   Dg Abd 2 Views  02/10/2013   CLINICAL DATA:  Abdominal pain and constipation.  EXAM: ABDOMEN - 2 VIEW  COMPARISON:  CT abdomen and pelvis 02/07/2013.  FINDINGS: No free intraperitoneal air or evidence of small bowel obstruction is identified. Contrast from the patient's CT scan is now in the ascending and transverse colon. Moderate stool burden is seen in the ascending and transverse colon these segments of the colon are mildly dilated. No abnormal abdominal calcification is seen.  IMPRESSION: Negative for free air or small bowel obstruction.  Moderate stool burden ascending and transverse colon. Dilatation of the colon is suggestive of ileus.   Electronically Signed   By: Drusilla Kanner M.D.   On: 02/10/2013 21:56    ASSESMENT: * Weight loss, nausea, postprandial abdominal pain  EGD 9/23: Retained gastric contents c/w gastroparesis, gastritis. Pt on chronic narcotics and toradol po. Tolerating ensure, not eating much in way of solids. stong functional component to this.  Pathology with chronic inactive gastritis, no H Pylori.  * Chronic severe constipation. Started on BID Miralax 9/21. Also getting Senokot. Still no BMs. Says her intestines hurt. No evidence of intestinal pathology or obstruction on CT scan of 9/19.  Moderate constipation on the plain x ray.  * Fibromyalgia. Chronic pain syndrome treated with prn narcotics at home and currently.   * Depression. Unable to find psychiatrist willing to take Medicaid. Would [facility] be available to her? She needs help with her chronic depression. I think this and the FM are contributing significantly to her GI pain.  * Dysuria, urinary retention.  No growth on 2 separate urine cultures.  * Normocytic anemia. Po Iron discontinued 9/22.    PLAN: *  Limit use of  narcotics and Toradol. Currently has MS contin, IV Dilaudid available PRN.  * Continue laxatives.  * Continue once daily PPI, omeprazole or other generic is fine  * Has 10/17 ROV with Dr Christella Hartigan.  *  Stop proctofoam, stop Pyridium.  *  I would review her med list and see what is helping, what is not.and what is essential and what is not. Adjust med list accordingly   LOS: 5 days   Gina Espinoza  02/12/2013, 10:32 AM Pager: [phone]    Attending physician's note   I have taken an interval history, reviewed the chart and examined the patient. I agree with the Advanced Practitioner's note, impression and recommendations.   Venita Lick. Russella Dar, MD [facility]

## 2013-02-12 NOTE — Clinical Social Work Psychosocial (Signed)
Clinical Social Work Department BRIEF PSYCHOSOCIAL ASSESSMENT 02/12/2013  Patient:  CARAMIA, BOUTIN     Account Number:  [id number]     Admit date:  02/07/2013  Clinical Social Worker:  Lavell Luster  Date/Time:  02/11/2013 11:00 AM  Referred by:  Physician  Date Referred:  02/11/2013 Referred for  SNF Placement   Other Referral:   Interview type:  Patient Other interview type:    PSYCHOSOCIAL DATA Living Status:  FAMILY Admitted from facility:   Level of care:   Primary support name:  Gerrianne Aydelott [phone] Primary support relationship to patient:  CHILD, ADULT Degree of support available:   Support is good. Patient has family in the area.    CURRENT CONCERNS Current Concerns  Post-Acute Placement   Other Concerns:    SOCIAL WORK ASSESSMENT / PLAN CSW met with patient to discuss recommendation for SNF placement and explain SNF search process. Patient agreeable to SNF search but would like to be considered for CIR placement. RNCM notified. Patient is from home with 24 y/o daughter. Patient states she has family in the area (daughters and sons) that can help her. Patient has no SNF preferences at this time, but wants something in [place]. Patient has no past SNF experience.   Assessment/plan status:  Psychosocial Support/Ongoing Assessment of Needs Other assessment/ plan:   Complete FL2, Fax out, PASRR   Information/referral to community resources:   SNF list and CSW contact information left with patient.    PATIENT'S/FAMILY'S RESPONSE TO PLAN OF CARE: Patient would like to be considered for CIR, but is agreeable to SNF search. Patient appreciative of CSW visit. CSW will continue to follow.    Roddie Mc, [place], [place], [phone]

## 2013-02-12 NOTE — Consult Note (Signed)
Physical Medicine and Rehabilitation Consult Reason for Consult: Deconditioning/fibromyalgia Referring Physician: [facility]   HPI: Gina Espinoza is a 52 y.o. right-handed female with history of fibromyalgia chronic pain syndrome. Patient independent prior to admission using a cane for mobility.  Admitted 02/07/2013 with decreased oral intake for the past 6 weeks and weight loss of 20 pounds and persistent nausea. She denied any diarrhea or vomiting. She did have complaints of suprapubic tenderness but denied any dysuria. Urine culture showed no growth. Patient unable to void with catheterization for 1350 mL and Foley catheter tube inserted. CT of the abdomen showed no hydronephrosis or adnexal masses. Renal ultrasound unremarkable. Underwent endoscopy per gastroenterology Dr. Christella Hartigan 02/09/2013 showing a moderate amount of retained solid and liquid gastric contents without obstruction. There is mild to moderate distal gastritis. She remains on her home regimen for fibromyalgia and pain control. Subcutaneous heparin added for DVT prophylaxis. Mild elevation in creatinine 1.52 and maintained on gentle IV fluids. Physical therapy evaluation completed 02/11/2013 with recommendations of physical medicine rehabilitation consult to consider inpatient rehabilitation services.   Review of Systems  Constitutional: Positive for weight loss and malaise/fatigue.  HENT: Positive for neck pain.   Musculoskeletal: Positive for myalgias and joint pain.  Neurological: Positive for weakness and headaches.  Psychiatric/Behavioral: Positive for depression. The patient has insomnia.        Anxiety  All other systems reviewed and are negative.   Past Medical History  Diagnosis Date  . Personal history of unspecified circulatory disease   . Chest pain, unspecified   . Unspecified essential hypertension   . Other and unspecified hyperlipidemia     mixed  . Headache(784.0)   . Cough   . Fibromyalgia   . Depression    . Anxiety    Past Surgical History  Procedure Laterality Date  . Hematoma evacuation      vaginal hematoma 05/23/02  . Anterior perineoplasty      posterior repair. 05/16/02  . Bladder suspension    . Cardiac catheterization  2006    normal coronaries  . Esophagogastroduodenoscopy N/A 02/09/2013    Procedure: ESOPHAGOGASTRODUODENOSCOPY (EGD);  Surgeon: Rachael Fee, MD;  Location: [facility] ENDOSCOPY;  Service: Endoscopy;  Laterality: N/A;   Family History  Problem Relation Age of Onset  . Allergies Mother     also children  . Asthma Daughter   . Heart disease Father   . Breast cancer Maternal Grandmother   . Colon cancer Brother   . Asthma Grandchild    Social History:  reports that she has never smoked. She does not have any smokeless tobacco history on file. She reports that she does not drink alcohol or use illicit drugs. Allergies:  Allergies  Allergen Reactions  . Savella [Milnacipran Hcl] Anaphylaxis  . Bee Venom   . Codeine   . Ivp Dye [Iodinated Diagnostic Agents]     A fibromyalgia medicine--cannot state name    Medications Prior to Admission  Medication Sig Dispense Refill  . ALPRAZolam (XANAX) 1 MG tablet Take 1 mg by mouth daily as needed for anxiety.      Marland Kitchen alprazolam (XANAX) 2 MG tablet Take 2 mg by mouth at bedtime as needed for sleep (as needed).      . Alum & Mag Hydroxide-Simeth (MAGIC MOUTHWASH) SOLN Take 5 mLs by mouth 3 (three) times daily.      Marland Kitchen amLODipine (NORVASC) 2.5 MG tablet Take 1 tablet (2.5 mg total) by mouth daily.  90 tablet  2  .  cholecalciferol (VITAMIN D) 400 UNITS TABS Take 1,000 Units by mouth 2 (two) times daily.       . clotrimazole (MYCELEX) 10 MG troche Take 10 mg by mouth 3 (three) times daily.       . diazepam (VALIUM) 5 MG tablet Take 10 mg by mouth every 8 (eight) hours as needed for anxiety.      . dicyclomine (BENTYL) 10 MG capsule Take 1 capsule (10 mg total) by mouth 4 (four) times daily -  before meals and at bedtime.  90  capsule  0  . dimenhyDRINATE (DRAMAMINE) 50 MG tablet Take 50 mg by mouth every 8 (eight) hours as needed. For motion sickness      . diphenhydrAMINE (BENADRYL) 25 mg capsule Take 25 mg by mouth every 6 (six) hours as needed for allergies.       Marland Kitchen escitalopram (LEXAPRO) 20 MG tablet Take 20 mg by mouth daily.      . ferrous sulfate 325 (65 FE) MG tablet Take 325 mg by mouth daily with breakfast.        . fluconazole (DIFLUCAN) 150 MG tablet Take 150 mg by mouth once.      . gabapentin (NEURONTIN) 100 MG capsule Take 100 mg by mouth 3 (three) times daily.      Marland Kitchen HYDROcodone-acetaminophen (NORCO) 5-325 MG per tablet Take 1 tablet by mouth every 6 (six) hours as needed for pain.       Marland Kitchen HYDROmorphone (DILAUDID) 2 MG tablet Take 2 mg by mouth every 4 (four) hours as needed for pain.      . hyoscyamine (LEVBID) 0.375 MG 12 hr tablet Take 1 tablet (0.375 mg total) by mouth every 12 (twelve) hours as needed for cramping.  20 tablet  0  . ketorolac (TORADOL) 10 MG tablet Take 20 mg by mouth every 6 (six) hours as needed for pain.       Marland Kitchen lidocaine (LIDODERM) 5 % Place 1 patch onto the skin daily. Remove & Discard patch within 12 hours or as directed by MD//3 patches at a time, 12 hours on and 12 hours off       . lidocaine (XYLOCAINE) 5 % ointment Apply 1 application topically as needed.      . loratadine (CLARITIN) 10 MG tablet Take 10 mg by mouth as needed for allergies.       . metaxalone (SKELAXIN) 800 MG tablet Take 800 mg by mouth 2 (two) times daily as needed for pain.       Marland Kitchen morphine (MS CONTIN) 15 MG 12 hr tablet Take 15 mg by mouth 2 (two) times daily as needed for pain.       Marland Kitchen morphine (MSIR) 15 MG tablet Take 15 mg by mouth every 4 (four) hours as needed for pain.      . nitroGLYCERIN (NITROSTAT) 0.4 MG SL tablet Place 1 tablet (0.4 mg total) under the tongue every 5 (five) minutes as needed. Place 1 tablet under tongue for chest pains every 5 minutes up to 3 doses in 15 minutes. If pain  persist call 911  25 tablet  prn  . olopatadine (PATANOL) 0.1 % ophthalmic solution Place 1 drop into both eyes 2 (two) times daily as needed for allergies.       Marland Kitchen oxyCODONE-acetaminophen (PERCOCET) 5-325 MG per tablet Take 1 tablet by mouth as directed.      . promethazine (PHENERGAN) 25 MG tablet Take 25 mg by mouth every 6 (six) hours as  needed. For nausea      . rosuvastatin (CRESTOR) 5 MG tablet Take 1 tablet (5 mg total) by mouth daily.  30 tablet  11  . temazepam (RESTORIL) 30 MG capsule Take 30 mg by mouth every other day. Alternates with Ambien      . tiZANidine (ZANAFLEX) 4 MG tablet Take 4 mg by mouth every 8 (eight) hours as needed (for muscle spasms).       . valACYclovir (VALTREX) 500 MG tablet Take 500 mg by mouth daily as needed.       . Xylitol (XYLIMELTS) 500 MG DISK Use as directed 1 tablet in the mouth or throat at bedtime.      Marland Kitchen zolpidem (AMBIEN) 10 MG tablet Take 10 mg by mouth every other day. Alternates with temazepam        Home: Home Living Family/patient expects to be discharged to:: Private residence Living Arrangements: Children Available Help at Discharge: Family;Available PRN/intermittently Type of Home: House Home Access: Ramped entrance Home Layout: Two level Alternate Level Stairs-Number of Steps: chair lift Home Equipment: Cane - quad  Functional History: Prior Function Comments: amb with quad cane Functional Status:  Mobility: Bed Mobility Bed Mobility: Sit to Supine Supine to Sit: 4: Min assist Sitting - Scoot to Edge of Bed: 5: Supervision Sit to Supine: 5: Supervision Transfers Transfers: Sit to Stand;Stand to Sit Sit to Stand: 4: Min assist;With upper extremity assist;From bed Stand to Sit: 4: Min guard;With upper extremity assist;To bed Ambulation/Gait Ambulation/Gait Assistance: 4: Min assist Ambulation Distance (Feet): 8 Feet Assistive device: Rolling walker Ambulation/Gait Assistance Details: Verbal cues to stand more erect and to  incr step length. Gait Pattern: Step-to pattern;Decreased step length - right;Decreased step length - left;Shuffle;Trunk flexed;Narrow base of support Gait velocity: decr    ADL:    Cognition: Cognition Overall Cognitive Status: Within Functional Limits for tasks assessed Orientation Level: Oriented X4 Cognition Arousal/Alertness: Awake/alert Behavior During Therapy: Flat affect Overall Cognitive Status: Within Functional Limits for tasks assessed  Blood pressure 116/63, pulse 65, temperature 98.6 F (37 C), temperature source Oral, resp. rate 18, height 5\' 6"  (1.676 m), weight 64.32 kg (141 lb 12.8 oz), SpO2 98.00%. Physical Exam  Vitals reviewed. Constitutional: She is oriented to person, place, and time.  HENT:  Head: Normocephalic.  Eyes: EOM are normal.  Neck: Normal range of motion. Neck supple. No thyromegaly present.  Cardiovascular: Normal rate and regular rhythm.   Pulmonary/Chest: Effort normal and breath sounds normal. No respiratory distress.  Abdominal: Soft. Bowel sounds are normal. She exhibits no distension. There is no rebound.  Neurological: She is alert and oriented to person, place, and time.  Flat affect  Skin: Skin is warm and dry.  Neuro:  Eyes without evidence of nystagmus  Tone is normal without evidence of spasticity   Motor strength is 4/5 in bilateral deltoid, biceps, triceps, finger flexors and extensors, wrist flexors and extensors, hip flexors, knee flexors and extensors, ankle dorsiflexors, plantar flexors, invertors and evertors, toe flexors and extensors  Sensory exam is normal to pinprick, proprioception and light touch in the upper and lower limbs      Results for orders placed during the hospital encounter of 02/07/13 (from the past 24 hour(s))  BASIC METABOLIC PANEL     Status: Abnormal   Collection Time    02/12/13  4:41 AM      Result Value Range   Sodium 141  135 - 145 mEq/L   Potassium 4.1  3.5 -  5.1 mEq/L   Chloride 103   96 - 112 mEq/L   CO2 33 (*) 19 - 32 mEq/L   Glucose, Bld 109 (*) 70 - 99 mg/dL   BUN 9  6 - 23 mg/dL   Creatinine, Ser 1.61 (*) 0.50 - 1.10 mg/dL   Calcium 8.7  8.4 - 09.6 mg/dL   GFR calc non Af Amer 55 (*) >90 mL/min   GFR calc Af Amer 64 (*) >90 mL/min   US Renal  02/12/2013   CLINICAL DATA:  Acute renal failure.  EXAM: RENAL/URINARY TRACT ULTRASOUND COMPLETE  COMPARISON:  CT abdomen and pelvis 02/07/2013.  FINDINGS: Right Kidney  Length: 10.9 cm. Demonstrates mildly increased cortical echogenicity. No stone, mass or hydronephrosis.  Left Kidney  Length: Measures 9.0 cm. Demonstrates mildly increased cortical echogenicity. No stone, mass or hydronephrosis.  Bladder:  Decompressed with a Foley catheter in place.  IMPRESSION: Negative for hydronephrosis.  Mildly increased cortical echogenicity compatible with medical renal disease.   Electronically Signed   By: Drusilla Kanner M.D.   On: 02/12/2013 00:18   Dg Abd 2 Views  02/10/2013   CLINICAL DATA:  Abdominal pain and constipation.  EXAM: ABDOMEN - 2 VIEW  COMPARISON:  CT abdomen and pelvis 02/07/2013.  FINDINGS: No free intraperitoneal air or evidence of small bowel obstruction is identified. Contrast from the patient's CT scan is now in the ascending and transverse colon. Moderate stool burden is seen in the ascending and transverse colon these segments of the colon are mildly dilated. No abnormal abdominal calcification is seen.  IMPRESSION: Negative for free air or small bowel obstruction.  Moderate stool burden ascending and transverse colon. Dilatation of the colon is suggestive of ileus.   Electronically Signed   By: Drusilla Kanner M.D.   On: 02/10/2013 21:56    Assessment/Plan: Diagnosis: Failure to thrive with  deconditioning 1. Does the need for close, 24 hr/day medical supervision in concert with the patient's rehab needs make it unreasonable for this patient to be served in a less intensive setting? No 2. Co-Morbidities requiring  supervision/potential complications: urinary retention 3. Due to bladder management, does the patient require 24 hr/day rehab nursing? Potentially 4. Does the patient require coordinated care of a physician, rehab nurse, NA to address physical and functional deficits in the context of the above medical diagnosis(es)? No Addressing deficits in the following areas: NA 5. Can the patient actively participate in an intensive therapy program of at least 3 hrs of therapy per day at least 5 days per week? No 6. The potential for patient to make measurable gains while on inpatient rehab is NA 7. Anticipated functional outcomes upon discharge from inpatient rehab are NA with PT, NA with OT, NA with SLP. 8. Estimated rehab length of stay to reach the above functional goals is: NA 9. Does the patient have adequate social supports to accommodate these discharge functional goals? Potentially 10. Anticipated D/C setting: Home vs SNF depending on family support 53. Anticipated post D/C treatments: HH therapy 12. Overall Rehab/Functional Prognosis: fair  RECOMMENDATIONS: This patient's condition is appropriate for continued rehabilitative care in the following setting: HH vs SNF Patient has agreed to participate in recommended program. Potentially Note that insurance prior authorization may be required for reimbursement for recommended care.  Comment: Pt feels like she doesn't need therapy.  "my problem is with my stomach"    02/12/2013

## 2013-02-12 NOTE — Care Management Note (Signed)
    Page 1 of 1   02/12/2013     4:50:53 PM   CARE MANAGEMENT NOTE 02/12/2013  Patient:  Gina Espinoza, Gina Espinoza   Account Number:  [id number]  Date Initiated:  02/12/2013  Documentation initiated by:  Letha Cape  Subjective/Objective Assessment:   dx decrease in appetite, fibramyalgia  admit- lives with children.     Action/Plan:   pt eval- recs snf   Anticipated DC Date:  02/12/2013   Anticipated DC Plan:  SKILLED NURSING FACILITY  In-house referral  Clinical Social Worker      DC Planning Services  CM consult      Choice offered to / List presented to:             Status of service:  Completed, signed off Medicare Important Message given?   (If response is "NO", the following Medicare IM given date fields will be blank) Date Medicare IM given:   Date Additional Medicare IM given:    Discharge Disposition:  SKILLED NURSING FACILITY  Per UR Regulation:  Reviewed for med. necessity/level of care/duration of stay  If discussed at Long Length of Stay Meetings, dates discussed:    Comments:  02/12/13 16:49 Letha Cape RN, BSN [phone] patient is for dc to snf today, CSW following.

## 2013-02-12 NOTE — Clinical Social Work Note (Signed)
CSW received call from patient while she was being transported to facility. Patient stated that she did not want to go to the facility and wanted to go home. CSW explained that she would have to go to the facility and then it would be her choice to stay or go home. CSW encouraged patient to request that facility set up HHPT services before going home. Patient expressed understanding.   CSW contacted facility. Facility states that patient told nurse that she was "tricked" into being sent to nursing facility and that she was not given choice of facility or informed of HHPT services available. Patient claims that she was "under the influence" of narcotics and that she didn't know she was going to a facility. CSW did in fact give patient 6 options for SNF and explained that she could also choose to go home with HHPT, but that HHPT is not as intensive as going to SNF for rehab. CSW assessed patient and discussed SNF placement when patient was alert and oriented x4.  Facility will set up HHPT services for patient IF she stays until tomorrow....  CSW signing off.  Roddie Mc, [place], [place], [phone]

## 2013-02-12 NOTE — Discharge Summary (Signed)
Physician Discharge Summary  Gina Espinoza AVW:[id number] DOB: [birth year] DOA: 02/07/2013  PCP: Gina Herter, MD  Admit date: 02/07/2013 Discharge date: 02/12/2013  Time spent: > 35 minutes  minutes  Recommendations for Outpatient Follow-up:   1. Discharge foley catheter in 48 hours 2. Follow up with primary care doctor in 1 week   Discharge Diagnoses:  Principal Problem:   Decrease in appetite Active Problems:   HYPERLIPIDEMIA-MIXED   HYPERTENSION, UNSPECIFIED   ANGINA, HX OF   Fibromyalgia syndrome   Depression   UTI (lower urinary tract infection)   Hypokalemia   Sinus bradycardia   Unspecified gastritis and gastroduodenitis without mention of hemorrhage   Protein-calorie malnutrition, severe   Abdominal  pain, other specified site   Unspecified constipation   Gastroparesis   Discharge Condition: stable   Diet recommendation: heart healthy   Filed Weights   02/07/13 2047 02/09/13 0517 02/11/13 0446  Weight: 59.648 kg (131 lb 8 oz) 63.05 kg (139 lb) 64.32 kg (141 lb 12.8 oz)    History of present illness:  Gina Espinoza is a 52 y.o. female with history of fibromyalgia on chronic pain medication including oral dilaudid and morphine IR and 12 hour tabs as well as multiple muscle relaxers per EMR, anxiety on benzodiazepines, insomnia on restoril, history of chronic chest pain presumed to be Coronary spasm on norvasc. Presenting with complaints of decrease oral intake for the last 6 weeks. She states that within the last week it has got worse and the problem has been persistent. Nothing she is aware of makes it better. Denies any diarrhea. Does complain of suprapubic tenderness but denies any dysuria    Hospital Course:  1. Decrease in appetite with weight loss of greater than 20 lbs in the last 3 to 4 months. Associated with abdominal pain on eating. unclear etiology likely opiod dependance with poor appetite   - GI consulted and she underwent EGD on 9/21 ,  showed distal gastritis and retained food int he stomach, which was probably from the chronic use of narcotics slowing the bowel motility. Biopsies were taken and recommendations to follow up as outpatient.  -cont PO intake; may need outpatient colonoscopy  2. Sinus bradycardia  - asymptomatic. No dizziness or lightheadedness. Resolved. TSH normal.  3. UTI Urine cultures negative, completed rocephin   4. Fibromyalgia continue home regimen dilaudid oral for severe pain morphine IR and morphine 12 hour tabs. Will not cover with any IV opiods  - Patient reports that opiods cause her to have nausea and as such she requires taking antiemetic prior. Her pain regimen may be contributing to # 1 but she reports being in pain all the time. D/w recommended to decrease narcotics  5. Depression continue home regimen  - May also be contributing to # 1 if not well controlled, another diagnosis to consider should patient's condition not improve.  6. Constipation: moderate stool on CT abdomen and pelvis.started on colace and miralax and MOM.  -started bowel regimen and decrease narcotics  7. urinary retention. Likely medication related;  stopped levbid,  Dramamine, and benadryl; bentyl -renal US: unremarkable; voiding trail in 48 hours; if failed may need urology eval  11. Acute renal failure: possibly from urinary retention; Cr improved   Patient is being d/c on foley; plan to remove within 48 hours   Procedures: CT abd, renal US; no hydronephrosis  Consultations:  GI   Discharge Exam: Filed Vitals:   02/12/13 1023  BP: 110/60  Pulse:  Temp:   Resp:     General: alert, awake  Cardiovascular: S1, S2 RRR  Respiratory: CTA BL   Discharge Instructions  Discharge Orders   Future Appointments Provider Department Dept Phone   03/07/2013 9:45 AM Gina Fee, MD [facility] [phone]   Future Orders Complete By Expires   Diet - low sodium heart healthy  As directed     Discharge instructions  As directed    Comments:     1. Discontinue foley catheter in 48 hours  2. Follow up with primary care doctor in 1 week   Increase activity slowly  As directed        Medication List    STOP taking these medications       diazepam 5 MG tablet  Commonly known as:  VALIUM     dicyclomine 10 MG capsule  Commonly known as:  BENTYL     dimenhyDRINATE 50 MG tablet  Commonly known as:  DRAMAMINE     diphenhydrAMINE 25 mg capsule  Commonly known as:  BENADRYL     hyoscyamine 0.375 MG 12 hr tablet  Commonly known as:  LEVBID     ketorolac 10 MG tablet  Commonly known as:  TORADOL     loratadine 10 MG tablet  Commonly known as:  CLARITIN     oxyCODONE-acetaminophen 5-325 MG per tablet  Commonly known as:  PERCOCET/ROXICET     promethazine 25 MG tablet  Commonly known as:  PHENERGAN     temazepam 30 MG capsule  Commonly known as:  RESTORIL     tiZANidine 4 MG tablet  Commonly known as:  ZANAFLEX      TAKE these medications       ALPRAZolam 1 MG tablet  Commonly known as:  XANAX  Take 1 tablet (1 mg total) by mouth daily as needed for anxiety.     amLODipine 2.5 MG tablet  Commonly known as:  NORVASC  Take 1 tablet (2.5 mg total) by mouth daily.     cholecalciferol 400 UNITS Tabs tablet  Commonly known as:  VITAMIN D  Take 1,000 Units by mouth 2 (two) times daily.     clotrimazole 10 MG troche  Commonly known as:  MYCELEX  Take 10 mg by mouth 3 (three) times daily.     docusate sodium 100 MG capsule  Commonly known as:  COLACE  Take 1 capsule (100 mg total) by mouth 2 (two) times daily.     escitalopram 20 MG tablet  Commonly known as:  LEXAPRO  Take 20 mg by mouth daily.     feeding supplement Liqd  Take 237 mLs by mouth 3 (three) times daily between meals.     ferrous sulfate 325 (65 FE) MG tablet  Take 325 mg by mouth daily with breakfast.     fluconazole 150 MG tablet  Commonly known as:  DIFLUCAN  Take 150 mg by mouth  once.     gabapentin 100 MG capsule  Commonly known as:  NEURONTIN  Take 100 mg by mouth 3 (three) times daily.     HYDROcodone-acetaminophen 5-325 MG per tablet  Commonly known as:  NORCO/VICODIN  Take 1 tablet by mouth every 6 (six) hours as needed for pain.     HYDROmorphone 2 MG tablet  Commonly known as:  DILAUDID  Take 1 tablet (2 mg total) by mouth every 4 (four) hours as needed for pain.     LIDODERM 5 %  Generic drug:  lidocaine  Place 1 patch onto the skin daily. Remove & Discard patch within 12 hours or as directed by MD//3 patches at a time, 12 hours on and 12 hours off     lidocaine 5 % ointment  Commonly known as:  XYLOCAINE  Apply 1 application topically as needed.     magic mouthwash Soln  Take 5 mLs by mouth 3 (three) times daily.     metaxalone 800 MG tablet  Commonly known as:  SKELAXIN  Take 800 mg by mouth 2 (two) times daily as needed for pain.     morphine 15 MG 12 hr tablet  Commonly known as:  MS CONTIN  Take 1 tablet (15 mg total) by mouth 2 (two) times daily as needed for pain.     nitroGLYCERIN 0.4 MG SL tablet  Commonly known as:  NITROSTAT  Place 1 tablet (0.4 mg total) under the tongue every 5 (five) minutes as needed. Place 1 tablet under tongue for chest pains every 5 minutes up to 3 doses in 15 minutes. If pain persist call 911     olopatadine 0.1 % ophthalmic solution  Commonly known as:  PATANOL  Place 1 drop into both eyes 2 (two) times daily as needed for allergies.     pantoprazole 40 MG tablet  Commonly known as:  PROTONIX  Take 1 tablet (40 mg total) by mouth daily at 6 (six) AM.     polyethylene glycol packet  Commonly known as:  MIRALAX / GLYCOLAX  Take 17 g by mouth 2 (two) times daily.     rosuvastatin 5 MG tablet  Commonly known as:  CRESTOR  Take 1 tablet (5 mg total) by mouth daily.     senna-docusate 8.6-50 MG per tablet  Commonly known as:  Senokot-S  Take 1 tablet by mouth 2 (two) times daily.     valACYclovir  500 MG tablet  Commonly known as:  VALTREX  Take 500 mg by mouth daily as needed.     XYLIMELTS 500 MG Disk  Generic drug:  Xylitol  Use as directed 1 tablet in the mouth or throat at bedtime.     zolpidem 10 MG tablet  Commonly known as:  AMBIEN  Take 1 tablet (10 mg total) by mouth every other day. Alternates with temazepam       Allergies  Allergen Reactions  . Savella [Milnacipran Hcl] Anaphylaxis  . Bee Venom   . Codeine   . Ivp Dye [Iodinated Diagnostic Agents]     A fibromyalgia medicine--cannot state name        Follow-up Information   Follow up with Gina Fee, MD In 1 week. (9:45 AM.  this replaces appointment of 02/24/2013 with Dr Rhea Belton at [facility]. )    Specialty:  Gastroenterology   Contact information:   [address] [place] [address] [phone]        The results of significant diagnostics from this hospitalization (including imaging, microbiology, ancillary and laboratory) are listed below for reference.    Significant Diagnostic Studies: Ct Abdomen Pelvis Wo Contrast  02/07/2013   CLINICAL DATA:  Abdominal pain, nausea, vomiting, weight loss  EXAM: CT ABDOMEN AND PELVIS WITHOUT CONTRAST  TECHNIQUE: Multidetector CT imaging of the abdomen and pelvis was performed following the standard protocol without intravenous contrast.  COMPARISON:  None.  FINDINGS: Study is limited without IV contrast. Sagittal images of the spine are unremarkable. Lung bases are unremarkable.  Unenhanced liver shows no biliary ductal  dilatation. No calcified gallstones are noted within gallbladder.  Unenhanced pancreas, spleen and adrenal glands are unremarkable.  Unenhanced kidneys are symmetrical in size. No nephrolithiasis. No hydronephrosis or hydroureter.  Oral contrast material was given to the patient. Abdominal aorta is unremarkable. No small bowel obstruction. No ascites or free air.  There is no pericecal inflammation.  Normal appendix is clearly visualize in  axial image 53. Moderate stool noted in transverse colon. There is a calcified lymph node just anterior right common iliac vein bifurcation measures 1.3 cm by 0.8 cm. A 2nd calcified lymph node anterior to right psoas muscle at the same level measures 9 mm.  Unenhanced uterus and adnexa is unremarkable. Mild distended urinary bladder. No renal bladder filling defects are noted. Bilateral distal ureter is unremarkable. There is a calcified lymph node in left pelvic side wall measures 11 mm x 7 mm. Probable calcified lymph node right pelvic side wall measures 9 mm. No adnexal masses noted.  IMPRESSION: 1. No nephrolithiasis. No hydronephrosis or hydroureter. 2. No calcified ureteral calculi. 3. No pericecal inflammation. Normal appendix. There is a calcified lymph node just anterior to right common iliac vein measures 1.3 x 0.8 cm. Calcified lymph nodes are noted bilateral pelvic sidewall. 4. No calcified calculi are noted within urinary bladder. 5. Unremarkable uterus and adnexal. No adnexal mass.   Electronically Signed   By: Natasha Mead   On: 02/07/2013 14:44   Dg Chest 2 View  02/07/2013   CLINICAL DATA:  Chest pain, abdominal pain, nausea  EXAM: CHEST  2 VIEW  COMPARISON:  09/13/2012  FINDINGS: The heart size and mediastinal contours are within normal limits. Both lungs are clear. The visualized skeletal structures are stable.  IMPRESSION: No active cardiopulmonary disease.   Electronically Signed   By: Natasha Mead   On: 02/07/2013 09:26   US Renal  02/12/2013   CLINICAL DATA:  Acute renal failure.  EXAM: RENAL/URINARY TRACT ULTRASOUND COMPLETE  COMPARISON:  CT abdomen and pelvis 02/07/2013.  FINDINGS: Right Kidney  Length: 10.9 cm. Demonstrates mildly increased cortical echogenicity. No stone, mass or hydronephrosis.  Left Kidney  Length: Measures 9.0 cm. Demonstrates mildly increased cortical echogenicity. No stone, mass or hydronephrosis.  Bladder:  Decompressed with a Foley catheter in place.  IMPRESSION:  Negative for hydronephrosis.  Mildly increased cortical echogenicity compatible with medical renal disease.   Electronically Signed   By: Drusilla Kanner M.D.   On: 02/12/2013 00:18   Dg Abd 2 Views  02/10/2013   CLINICAL DATA:  Abdominal pain and constipation.  EXAM: ABDOMEN - 2 VIEW  COMPARISON:  CT abdomen and pelvis 02/07/2013.  FINDINGS: No free intraperitoneal air or evidence of small bowel obstruction is identified. Contrast from the patient's CT scan is now in the ascending and transverse colon. Moderate stool burden is seen in the ascending and transverse colon these segments of the colon are mildly dilated. No abnormal abdominal calcification is seen.  IMPRESSION: Negative for free air or small bowel obstruction.  Moderate stool burden ascending and transverse colon. Dilatation of the colon is suggestive of ileus.   Electronically Signed   By: Drusilla Kanner M.D.   On: 02/10/2013 21:56    Microbiology: Recent Results (from the past 240 hour(s))  URINE CULTURE     Status: None   Collection Time    02/08/13  4:39 PM      Result Value Range Status   Specimen Description URINE, RANDOM   Final   Special Requests  NONE   Final   Culture  Setup Time     Final   Value: 02/08/2013 19:17     Performed at [facility] Count     Final   Value: NO GROWTH     Performed at [facility]   Culture     Final   Value: NO GROWTH     Performed at [facility]   Report Status 02/09/2013 FINAL   Final  URINE CULTURE     Status: None   Collection Time    02/11/13  6:20 AM      Result Value Range Status   Specimen Description URINE, CATHETERIZED   Final   Special Requests NONE   Final   Culture  Setup Time     Final   Value: 02/11/2013 11:11     Performed at [facility] Count     Final   Value: NO GROWTH     Performed at [facility]   Culture     Final   Value: NO GROWTH     Performed at [facility]   Report Status  02/12/2013 FINAL   Final     Labs: Basic Metabolic Panel:  Recent Labs Lab 02/07/13 0841 02/07/13 1908 02/08/13 0430 02/11/13 0510 02/12/13 0441  NA 137  --  139 139 141  K 3.4*  --  3.7 4.1 4.1  CL 98  --  103 99 103  CO2 25  --  30 30 33*  GLUCOSE 88  --  103* 104* 109*  BUN 10  --  5* 14 9  CREATININE 0.84 0.90 0.89 1.52* 1.13*  CALCIUM 9.7  --  8.6 9.0 8.7  MG  --  2.1  --   --   --   PHOS  --  3.8  --   --   --    Liver Function Tests:  Recent Labs Lab 02/07/13 0841  AST 22  ALT 17  ALKPHOS 94  BILITOT 0.6  PROT 7.7  ALBUMIN 4.2    Recent Labs Lab 02/07/13 0841  LIPASE 34   No results found for this basename: AMMONIA,  in the last 168 hours CBC:  Recent Labs Lab 02/07/13 0841 02/07/13 1908 02/08/13 0430 02/11/13 0510  WBC 3.4* 3.3* 2.9* 5.3  NEUTROABS 1.6*  --   --   --   HGB 11.8* 11.2* 10.5* 10.9*  HCT 35.3* 34.1* 32.1* 33.6*  MCV 77.8* 78.2 78.9 79.4  PLT 224 222 204 203   Cardiac Enzymes: No results found for this basename: CKTOTAL, CKMB, CKMBINDEX, TROPONINI,  in the last 168 hours BNP: BNP (last 3 results) No results found for this basename: PROBNP,  in the last 8760 hours CBG:  Recent Labs Lab 02/12/13 0822  GLUCAP 98       Signed:  Yeily Link N  [facility] 02/12/2013, 11:02 AM

## 2013-02-12 NOTE — Clinical Social Work Placement (Signed)
Clinical Social Work Department CLINICAL SOCIAL WORK PLACEMENT NOTE 02/12/2013  Patient:  Gina Espinoza, Gina Espinoza  Account Number:  [id number] Admit date:  02/07/2013  Clinical Social Worker:  Cherre Blanc, [place]  Date/time:  02/11/2013 11:00 AM  Clinical Social Work is seeking post-discharge placement for this patient at the following level of care:   SKILLED NURSING   (*CSW will update this form in Epic as items are completed)   02/11/2013  Patient/family provided with [facility] Department of Clinical Social Work's list of facilities offering this level of care within the geographic area requested by the patient (or if unable, by the patient's family).  02/11/2013  Patient/family informed of their freedom to choose among providers that offer the needed level of care, that participate in Medicare, Medicaid or managed care program needed by the patient, have an available bed and are willing to accept the patient.  02/11/2013  Patient/family informed of [facility]' ownership interest in [facility], as well as of the fact that they are under no obligation to receive care at this facility.  PASARR submitted to EDS on 02/11/2013 PASARR number received from EDS on 02/11/2013  FL2 transmitted to all facilities in geographic area requested by pt/family on  02/11/2013 FL2 transmitted to all facilities within larger geographic area on   Patient informed that his/her managed care company has contracts with or will negotiate with  certain facilities, including the following:     Patient/family informed of bed offers received:  02/12/2013 Patient chooses bed at [facility] [place] Physician recommends and patient chooses bed at    Patient to be transferred to [facility] [place] on  02/12/2013 Patient to be transferred to facility by Ambulance  The following physician request were entered in Epic:   Additional Comments: Per MD patient ready for DC  02/12/13. Patient DC to [facility]. Patient, RN, and facility notified of discharge. Ambulance transport set up for patient. CSW signing off.   Roddie Mc, [place], [place], [phone]

## 2013-02-14 ENCOUNTER — Ambulatory Visit: Payer: Medicare Other | Admitting: Medical

## 2013-02-14 ENCOUNTER — Ambulatory Visit (INDEPENDENT_AMBULATORY_CARE_PROVIDER_SITE_OTHER): Payer: Medicare Other | Admitting: Medical

## 2013-02-14 ENCOUNTER — Ambulatory Visit (INDEPENDENT_AMBULATORY_CARE_PROVIDER_SITE_OTHER): Payer: Medicare Other | Admitting: Internal Medicine

## 2013-02-14 ENCOUNTER — Telehealth: Payer: Self-pay | Admitting: Medical

## 2013-02-14 ENCOUNTER — Encounter: Payer: Self-pay | Admitting: Medical

## 2013-02-14 VITALS — BP 100/60 | HR 60 | Temp 98.1°F | Resp 16 | Wt 137.0 lb

## 2013-02-14 VITALS — BP 104/68 | HR 47 | Temp 99.0°F | Resp 16 | Ht 67.0 in | Wt 137.6 lb

## 2013-02-14 DIAGNOSIS — M797 Fibromyalgia: Secondary | ICD-10-CM

## 2013-02-14 DIAGNOSIS — Z9889 Other specified postprocedural states: Secondary | ICD-10-CM | POA: Diagnosis not present

## 2013-02-14 DIAGNOSIS — R109 Unspecified abdominal pain: Secondary | ICD-10-CM | POA: Diagnosis not present

## 2013-02-14 DIAGNOSIS — K3184 Gastroparesis: Secondary | ICD-10-CM

## 2013-02-14 DIAGNOSIS — F32A Depression, unspecified: Secondary | ICD-10-CM

## 2013-02-14 DIAGNOSIS — K5900 Constipation, unspecified: Secondary | ICD-10-CM

## 2013-02-14 DIAGNOSIS — R630 Anorexia: Secondary | ICD-10-CM | POA: Diagnosis not present

## 2013-02-14 DIAGNOSIS — K5909 Other constipation: Secondary | ICD-10-CM

## 2013-02-14 DIAGNOSIS — R339 Retention of urine, unspecified: Secondary | ICD-10-CM | POA: Diagnosis not present

## 2013-02-14 DIAGNOSIS — F329 Major depressive disorder, single episode, unspecified: Secondary | ICD-10-CM

## 2013-02-14 DIAGNOSIS — Z960 Presence of urogenital implants: Secondary | ICD-10-CM

## 2013-02-14 DIAGNOSIS — IMO0001 Reserved for inherently not codable concepts without codable children: Secondary | ICD-10-CM

## 2013-02-14 LAB — POCT URINALYSIS DIPSTICK
Blood, UA: NEGATIVE
Glucose, UA: NEGATIVE
Ketones, UA: NEGATIVE
Nitrite, UA: NEGATIVE
Spec Grav, UA: 1.015
Urobilinogen, UA: 0.2
pH, UA: 7.5

## 2013-02-14 LAB — POCT UA - MICROSCOPIC ONLY
Casts, Ur, LPF, POC: NEGATIVE
Crystals, Ur, HPF, POC: NEGATIVE
Epithelial cells, urine per micros: NEGATIVE
Mucus, UA: NEGATIVE
Yeast, UA: NEGATIVE

## 2013-02-14 MED ORDER — BETHANECHOL CHLORIDE 10 MG PO TABS: 10.0000 mg | ORAL_TABLET | Freq: Three times a day (TID) | ORAL | Status: DC

## 2013-02-14 MED ORDER — OXYBUTYNIN CHLORIDE 5 MG PO TABS: 5.0000 mg | ORAL_TABLET | Freq: Three times a day (TID) | ORAL | Status: DC

## 2013-02-14 NOTE — Progress Notes (Signed)
Subjective:    Patient ID: Gina Espinoza, female    DOB: 1960-07-13, 52 y.o.   MRN: [id number]  HPI urinary catheter removed this morning after 6 days of use in hospital She has not unable to urinate since Minimal pressure symptoms Did not have dysuria over the last few days Has a history of frequent UTIs  Primary care Dr. Susann Givens Patient Active Problem List   Diagnosis Date Noted  . Abdominal  pain, other specified site 02/10/2013  . Unspecified constipation 02/10/2013  . Gastroparesis 02/10/2013  . Unspecified gastritis and gastroduodenitis without mention of hemorrhage 02/09/2013  . Protein-calorie malnutrition, severe 02/09/2013  . Decrease in appetite 02/07/2013  . UTI (lower urinary tract infection) 02/07/2013  . Hypokalemia 02/07/2013  . Sinus bradycardia 02/07/2013  . Fibromyalgia syndrome 11/22/2011  . Depression 11/22/2011  . HEADACHE, CHRONIC 11/20/2008  . COUGH 11/05/2008  . HYPERLIPIDEMIA-MIXED 11/02/2008  . HYPERTENSION, UNSPECIFIED 11/02/2008  . CHEST PAIN-UNSPECIFIED 11/02/2008  . ANGINA, HX OF 11/02/2008   Current outpatient prescriptions:ALPRAZolam (XANAX) 1 MG tablet, Take 1 tablet (1 mg total) by mouth daily as needed for anxiety., Disp: 10 tablet, Rfl: 0;  Alum & Mag Hydroxide-Simeth (MAGIC MOUTHWASH) SOLN, Take 5 mLs by mouth 3 (three) times daily., Disp: , Rfl: ;  amLODipine (NORVASC) 2.5 MG tablet, Take 1 tablet (2.5 mg total) by mouth daily., Disp: 90 tablet, Rfl: 2 docusate sodium (COLACE) 100 MG capsule, Take 1 capsule (100 mg total) by mouth 2 (two) times daily., Disp: 10 capsule, Rfl: 0;  escitalopram (LEXAPRO) 20 MG tablet, Take 20 mg by mouth daily., Disp: , Rfl: ;  feeding supplement (ENSURE COMPLETE) LIQD, Take 237 mLs by mouth 3 (three) times daily between meals., Disp: , Rfl: ;  gabapentin (NEURONTIN) 100 MG capsule, Take 100 mg by mouth 3 (three) times daily., Disp: , Rfl:  HYDROcodone-acetaminophen (NORCO/VICODIN) 5-325 MG per tablet, Take 1  tablet by mouth every 6 (six) hours as needed for pain., Disp: 10 tablet, Rfl: 0;  HYDROmorphone (DILAUDID) 2 MG tablet, Take 1 tablet (2 mg total) by mouth every 4 (four) hours as needed for pain., Disp: 10 tablet, Rfl: 0 lidocaine (LIDODERM) 5 %, Place 1 patch onto the skin daily. Remove & Discard patch within 12 hours or as directed by MD//3 patches at a time, 12 hours on and 12 hours off , Disp: , Rfl: ;  metaxalone (SKELAXIN) 800 MG tablet, Take 800 mg by mouth 2 (two) times daily as needed (muscle spasms). , Disp: , Rfl:  morphine (MS CONTIN) 15 MG 12 hr tablet, Take 1 tablet (15 mg total) by mouth 2 (two) times daily as needed for pain., Disp: 10 tablet, Rfl: 0;  nitroGLYCERIN (NITROSTAT) 0.4 MG SL tablet, Place 1 tablet (0.4 mg total) under the tongue every 5 (five) minutes as needed. Place 1 tablet under tongue for chest pains every 5 minutes up to 3 doses in 15 minutes. If pain persist call 911, Disp: 25 tablet, Rfl: prn olopatadine (PATANOL) 0.1 % ophthalmic solution, Place 1 drop into both eyes 2 (two) times daily as needed for allergies. , Disp: , Rfl: ;  pantoprazole (PROTONIX) 40 MG tablet, Take 1 tablet (40 mg total) by mouth daily at 6 (six) AM., Disp: , Rfl: ;  polyethylene glycol (MIRALAX / GLYCOLAX) packet, Take 17 g by mouth 2 (two) times daily., Disp: 14 each, Rfl: 0 senna-docusate (SENOKOT-S) 8.6-50 MG per tablet, Take 1 tablet by mouth 2 (two) times daily., Disp: , Rfl: ;  valACYclovir (VALTREX) 500 MG tablet, Take 500 mg by mouth daily as needed. , Disp: , Rfl: ;  zolpidem (AMBIEN) 10 MG tablet, Take 1 tablet (10 mg total) by mouth every other day. Alternates with temazepam, Disp: 10 tablet, Rfl: 0 bethanechol (URECHOLINE) 10 MG tablet, Take 10 mg by mouth 3 (three) times daily as needed (bladder spasms)., Disp: , Rfl: ;  Cranberry-Vitamin C-Probiotic (AZO CRANBERRY PO), Take 1 tablet by mouth 2 (two) times daily as needed (urinary pain)., Disp: , Rfl: ;  nitrofurantoin,  macrocrystal-monohydrate, (MACROBID) 100 MG capsule, Take 1 capsule (100 mg total) by mouth 2 (two) times daily., Disp: 10 capsule, Rfl: 0 phenazopyridine (PYRIDIUM) 200 MG tablet, Take 1 tablet (200 mg total) by mouth 3 (three) times daily., Disp: 15 tablet, Rfl: 0;  rosuvastatin (CRESTOR) 5 MG tablet, Take 5 mg by mouth at bedtime., Disp: , Rfl:      Review of Systems Noncontributory    Objective:   Physical Exam BP 104/68  Pulse 47  Temp(Src) 99 F (37.2 C) (Oral)  Resp 16  Ht 5\' 7"  (1.702 m)  Wt 137 lb 9.6 oz (62.415 kg)  BMI 21.55 kg/m2  SpO2 98% No acute distress Abdomen nontender, nonswollen No CVA tenderness to percussion  Procedure= urinary catheterization.PA-C Marte successful     Results for orders placed in visit on 02/14/13  POCT UA - MICROSCOPIC ONLY      Result Value Range   WBC, Ur, HPF, POC 5-29     RBC, urine, microscopic 3-6     Bacteria, U Microscopic trace     Mucus, UA neg     Epithelial cells, urine per micros neg     Crystals, Ur, HPF, POC neg     Casts, Ur, LPF, POC neg     Yeast, UA neg    POCT URINALYSIS DIPSTICK      Result Value Range   Color, UA yellow     Clarity, UA clear     Glucose, UA neg     Bilirubin, UA neg     Ketones, UA neg     Spec Grav, UA 1.015     Blood, UA neg     pH, UA 7.5     Protein, UA neg     Urobilinogen, UA 0.2     Nitrite, UA neg     Leukocytes, UA Negative      Assessment & Plan:  Problem 1 urinary retention Problem #2 recurrent UTIs  Meds ordered this encounter  Medications  .  bethanechol (URECHOLINE) 10 MG tablet    Sig: Take 1 tablet (10 mg total) by mouth 3 (three) times daily. If needed for  Bladder spasm    Dispense:  10 tablet    Refill:  0   re catheterize in 12 hours if no urine(she is against leaving the catheter in)

## 2013-02-14 NOTE — Patient Instructions (Signed)
RESOURCES in [place], [place]  If you are experiencing a mental health crisis or an emergency, please call 911 or go to the nearest emergency department.  [facility]   [phone] [facility]  [phone] [facility]   [phone]  Suicide Hotline 1-800-Suicide (1-800-784-2433)  National Suicide Prevention Lifeline 1-800-273-TALK  (1-800-273-8255)  Domestic Violence, Rape/Crisis - [facility] [phone]  The National Domestic Violence Hotline 1-800-799-SAFE (1-800-799-7233)  To report Child or Elder Abuse, please call: [place] [employer]  [phone] [place] [employer]  [phone]  LGBT Youth Crisis Line 1-866-488-7386  Teen Crisis line [phone] or 1-877-332-7333     Psychiatry and Counseling services   Dr. Parish McKinney, psychiatry [phone] office [email or web address] [address], [address], [place], [place] [address] Dr. Parish McKinney Meredith Baker, NP Micheal Lefaive, NP  Anxiety, Depression, ADHD, OCD, Eating Disorders, Bipolar, other   [facility] [phone] office [email or web address] [address] [place], [place] [address]  Dr. Masoud Hejazi, psychiatry services  Dr. Todd Lewis  Depression, Anxiety, Substance Abuse, Couples Issues, Adolescent Issues Robyn Bridges, NP Depression, Anxiety, ADHD, Women's issues, Bipolar Disorder, Substance   Abuse Kelly Virgil, NP Depression, Anxiety, Aging, ADHD, Bipolar Disorder, Substance Abuse Ann Bailey, NP  Mood disturbances, ADHD, children, adolescents, adults Deb Connery, Therapist Sexual Addiction, Bipolar, Depression, Anxiety, Substance Addiction Claudia McCoy, Therapist Grief and Loss, Anxiety, Depression, Bipolar, Medical Challenges, Life    Transitions Linda Hileman, Therapist Substance Abuse, Relationships, Clergy Families, Anger and Stress Management, Postpartum Depression,  Pre-Marital Counseling Patricia Goral, Therapist [place], Anxiety, Depression, ADHD, Adjustment Disorder, PTSD, Grief and Loss, Divorce, Adoption Concerns   [facility] [phone] office [email or web address] [address], [place], [place] [address]  Erik Nelson, MA, clinical psychologist  Cognitive-Behavior Therapy; Mood Disorders; Anxiety Disorders; adult and child ADHD; Family Therapy; Stress Management; personal growth, and Marital Therapy.    Dennis McKnight Ph.D., clinical psychologist Cognitive-Behavior Therapy; Mood Disorders; Anxiety Disorders; Stress     Management   Elaine Talbert Ph.D., clinical psychologist [phone] office [address] [place], [place] [address] Cognitive Behavior Therapy, Depression, Bipolar, Anxiety, Grief and Loss    [facility] [phone] office [address] [place], [place] [address] Crisis services, Family support, in home therapy, treatment for Anxiety, PTSD, Sexual Assault, Substance Abuse, Financial/Credit Counseling, Variety of other services    [email or web address] [phone] office [address], [place], [place] [address]  Scott Yount, Ph.D., LPC Family, Couples, Anxiety, Depression, ADHD, Abuse, Anger Management Catharine Dowda, M.Ed., LPC Couples, Sexual orientation, Domestic violence, Child Abuse, Major Life Change,  Depression Traci Collins, LPC Marriage counseling, Women's Issues, Depression, Intimacy, Career Issues Katherine Glenn, Ph.D.,  LPC PTSD, Addictions, Grief, Anxiety, Sexual Orientation Eugene Naughton, LPC Teen and child depression, anxiety, parenting challenges, Adult depression, self injury, relationship issues. Karen Elliott, LPC Addiction, PTSD, Eating Disorders, Depression, Sexual Orientation Sara DeHart Young, LPC Eating  disorder, Anxiety and Depression, Grief, Divorce, Couples and Family Counseling, Parenting   Dr. Gerald Plovsky, psychiatry [phone] office [address] [place], [place] [address]  Geriatric psychiatry services   [facility] [phone] office, 24x7 help line [email or web address] [address] [place], [place] [address] Substance Abuse, Depression, Anxiety, Mood Disorders, other Addictions, DWI Assessment/Treatment, Teen Issues, ADHD, Family Therapy Dr. Carol Sena, Psychiatry services   Stephen Ringer, Therapist Initial assessments, Clinical Director, Substance Abuse counseling, DWI and DMV assessments, individual and group counseling   Nancy King, Therapist Depression, Anxiety, Dysfunctional families, Individual and Couples Counseling, Addiction, Sexual Abuse, Childhood Trauma, Spiritually Based Counseling Robin Ringer, Therapist [facility], Children and Adult Individual Counseling, Depression, Anxiety, Mood Disorders Valerie Phillips, Therapist Ages 5 and up, individual, couple and family therapy, family concerns, ADHD, Mood disorders, Grief, Substance Abuse Melissa Mekita, Therapist Female patients only - Mood disorders, Depression, Anxiety, PTSD, Gried,   Abuse, Relationships   Dr. Rupinder Kaur, psychiatry [phone] office [address], [place], [place] [address]   

## 2013-02-14 NOTE — Progress Notes (Signed)
VCO. SP.  Betadine prep.  Foley catheter inserted easily. 600 cc of urine collected.  Catheter removed without difficulty. Area cleaned. Patient tolerated well.

## 2013-02-14 NOTE — Telephone Encounter (Signed)
Called pt and told her that shane would like her to go to [facility] to get a foley cath placed in. She states she was going to try to go on her own because she didn't want to get another cath placed. But she was also told earlier that if she couldn't go to the bathroom and having pain to go back to ER.  And we would try to get her in with [facility]

## 2013-02-14 NOTE — Progress Notes (Signed)
Subjective: Here primarily for catheter removal. She was hospitalized 02/07/2013-02/12/2013 for decrease in appetite and weight loss x6 weeks.  A urinary catheter was placed while she was in the hospital due to suprapubic pain and bladder spasm, and she was supposed to have this taken out yesterday, but she is here now.  Overall she feels a little improved with her appetite and pain , no urinary complaints today.  Medical team: Dr. Corliss Skains - rheumatologist, next appt January 2015 Dr. Levada Schilling - gynecologist Dr. Susann Givens - primary care here Has f/u with GI 02/05/13.  Last BM 2 days ago.  Always has had lazy bowels.   Currently she states no other concerns.    Objective: Gen: Well-nourished, thin AA female Heart: RRR, normal S1, normal S2, no murmurs Lungs clear Abdomen: Nontender, nondistended, no mass, no organomegaly  Plan: Urinary catheter in place.  Exam chaperoned by nurse Cleaned and prepped the catheter insertion point, use 10 cc syringe to deflate the bulb, removed the catheter without problem.  Assessment: Encounter Diagnoses  Name Primary?  . Decreased appetite Yes  . Urinary catheter in place   . Chronic constipation   . Abdominal pain   . Fibromyalgia   . Depression   . Gastroparesis     Plan: Decreased appetite, abdominal pain, gastroparesis-after reviewing the hospital discharge summary and hospital notes, it would seem that her gastroparesis, abdominal discomfort , weight loss, and decreased appetite, was due to multiple issues including chronic use of pain medications, depression, gastritis. She has GI followup planned   Urinary catheter-removed without problem. Advised that if she has any urinary issues, concerns or UTI symptoms in the near future, to return or call.  Fibromyalgia- and that she follow up with Dr. Corliss Skains, consider other therapy options  Depression- she notes that she would like to see a counselor, has been unable to get in with a counselor prior  due to insurance not being accepted. I gave her a list of resources, specific recommendations for counselors, and that she pursue this. She'll call if she has trouble getting in somewhere  Followup here with Dr. Susann Givens as planned.

## 2013-02-15 ENCOUNTER — Telehealth: Payer: Self-pay

## 2013-02-15 ENCOUNTER — Emergency Department (HOSPITAL_COMMUNITY): Payer: Medicare Other

## 2013-02-15 ENCOUNTER — Encounter (HOSPITAL_COMMUNITY): Payer: Self-pay | Admitting: *Deleted

## 2013-02-15 ENCOUNTER — Emergency Department (HOSPITAL_COMMUNITY)
Admission: EM | Admit: 2013-02-15 | Discharge: 2013-02-16 | Disposition: A | Discharge status: Home / Self Care | Payer: Medicare Other | Attending: Emergency Medicine | Admitting: Emergency Medicine

## 2013-02-15 DIAGNOSIS — E785 Hyperlipidemia, unspecified: Secondary | ICD-10-CM | POA: Diagnosis not present

## 2013-02-15 DIAGNOSIS — I10 Essential (primary) hypertension: Secondary | ICD-10-CM | POA: Insufficient documentation

## 2013-02-15 DIAGNOSIS — Z79899 Other long term (current) drug therapy: Secondary | ICD-10-CM | POA: Insufficient documentation

## 2013-02-15 DIAGNOSIS — M5126 Other intervertebral disc displacement, lumbar region: Secondary | ICD-10-CM | POA: Diagnosis not present

## 2013-02-15 DIAGNOSIS — F3289 Other specified depressive episodes: Secondary | ICD-10-CM | POA: Insufficient documentation

## 2013-02-15 DIAGNOSIS — N390 Urinary tract infection, site not specified: Secondary | ICD-10-CM | POA: Insufficient documentation

## 2013-02-15 DIAGNOSIS — R339 Retention of urine, unspecified: Secondary | ICD-10-CM

## 2013-02-15 DIAGNOSIS — R110 Nausea: Secondary | ICD-10-CM | POA: Diagnosis not present

## 2013-02-15 DIAGNOSIS — M47817 Spondylosis without myelopathy or radiculopathy, lumbosacral region: Secondary | ICD-10-CM | POA: Diagnosis not present

## 2013-02-15 DIAGNOSIS — F329 Major depressive disorder, single episode, unspecified: Secondary | ICD-10-CM | POA: Diagnosis not present

## 2013-02-15 DIAGNOSIS — F411 Generalized anxiety disorder: Secondary | ICD-10-CM | POA: Insufficient documentation

## 2013-02-15 DIAGNOSIS — Z9861 Coronary angioplasty status: Secondary | ICD-10-CM | POA: Diagnosis not present

## 2013-02-15 LAB — URINALYSIS, ROUTINE W REFLEX MICROSCOPIC
Bilirubin Urine: NEGATIVE
Glucose, UA: NEGATIVE mg/dL
Hgb urine dipstick: NEGATIVE
Nitrite: POSITIVE — AB
Protein, ur: NEGATIVE mg/dL
Urobilinogen, UA: 1 mg/dL (ref 0.0–1.0)

## 2013-02-15 LAB — BASIC METABOLIC PANEL
BUN: 14 mg/dL (ref 6–23)
Calcium: 9.5 mg/dL (ref 8.4–10.5)
Creatinine, Ser: 1.13 mg/dL — ABNORMAL HIGH (ref 0.50–1.10)
GFR calc Af Amer: 64 mL/min — ABNORMAL LOW (ref >90)
GFR calc non Af Amer: 55 mL/min — ABNORMAL LOW (ref >90)
Glucose, Bld: 156 mg/dL — ABNORMAL HIGH (ref 70–99)

## 2013-02-15 LAB — CBC
HCT: 36 % (ref 36.0–46.0)
MCH: 25.6 pg — ABNORMAL LOW (ref 26.0–34.0)
MCHC: 33.1 g/dL (ref 30.0–36.0)
MCV: 77.6 fL — ABNORMAL LOW (ref 78.0–100.0)
Platelets: 260 10*3/uL (ref 150–400)
RDW: 14.7 % (ref 11.5–15.5)
WBC: 6.2 10*3/uL (ref 4.0–10.5)

## 2013-02-15 LAB — URINE MICROSCOPIC-ADD ON

## 2013-02-15 NOTE — ED Notes (Signed)
Pt discharged from hospital on Wednesday with catheter. Catheter removed Thursday. Voided some earlier today, but feels unable to completely empty bladder.

## 2013-02-15 NOTE — ED Notes (Signed)
RN transported to MRI.

## 2013-02-15 NOTE — ED Provider Notes (Signed)
CSN: [id number]     Arrival date & time 02/15/13  1947 History   First MD Initiated Contact with Patient 02/15/13 2011     Chief Complaint  Patient presents with  . Urinary Retention   (Consider location/radiation/quality/duration/timing/severity/associated sxs/prior Treatment) Patient is a 52 y.o. female presenting with abdominal pain.  Abdominal Pain Pain location:  Suprapubic, LLQ and LUQ Pain quality: cramping   Pain radiates to:  Does not radiate Pain severity:  Severe Onset quality:  Gradual Duration:  2 days Timing:  Constant Progression:  Worsening Chronicity:  Chronic Context: medication withdrawal (ran out of home opiates)   Relieved by:  Nothing Worsened by:  Movement and palpation Ineffective treatments:  None tried Associated symptoms: chills and nausea   Associated symptoms: no anorexia, no chest pain, no constipation, no cough, no diarrhea, no dysuria, no fever, no melena, no shortness of breath, no sore throat, no vaginal bleeding, no vaginal discharge and no vomiting     Past Medical History  Diagnosis Date  . Personal history of unspecified circulatory disease   . Chest pain, unspecified   . Unspecified essential hypertension   . Other and unspecified hyperlipidemia     mixed  . Headache(784.0)   . Cough   . Fibromyalgia   . Depression   . Anxiety    Past Surgical History  Procedure Laterality Date  . Hematoma evacuation      vaginal hematoma 05/23/02  . Anterior perineoplasty      posterior repair. 05/16/02  . Bladder suspension    . Cardiac catheterization  2006    normal coronaries  . Esophagogastroduodenoscopy N/A 02/09/2013    Procedure: ESOPHAGOGASTRODUODENOSCOPY (EGD);  Surgeon: Rachael Fee, MD;  Location: [facility] ENDOSCOPY;  Service: Endoscopy;  Laterality: N/A;   Family History  Problem Relation Age of Onset  . Allergies Mother     also children  . Asthma Daughter   . Heart disease Father   . Breast cancer Maternal Grandmother   .  Colon cancer Brother   . Asthma Grandchild    History  Substance Use Topics  . Smoking status: Never Smoker   . Smokeless tobacco: Not on file  . Alcohol Use: No   OB History   Grav Para Term Preterm Abortions TAB SAB Ect Mult Living                 Review of Systems  Constitutional: Positive for chills. Negative for fever.  HENT: Negative for congestion, sore throat and rhinorrhea.   Eyes: Negative for photophobia and visual disturbance.  Respiratory: Negative for cough and shortness of breath.   Cardiovascular: Negative for chest pain and leg swelling.  Gastrointestinal: Positive for nausea and abdominal pain. Negative for vomiting, diarrhea, constipation, melena and anorexia.  Endocrine: Negative for polyphagia and polyuria.  Genitourinary: Negative for dysuria, flank pain, vaginal bleeding, vaginal discharge and enuresis.  Musculoskeletal: Negative for back pain and gait problem.  Skin: Negative for color change and rash.  Neurological: Negative for dizziness, syncope, light-headedness and numbness.  Hematological: Negative for adenopathy. Does not bruise/bleed easily.  All other systems reviewed and are negative.    Allergies  Savella; Bee venom; Codeine; and Ivp dye  Home Medications   Current Outpatient Rx  Name  Route  Sig  Dispense  Refill  . ALPRAZolam (XANAX) 1 MG tablet   Oral   Take 1 tablet (1 mg total) by mouth daily as needed for anxiety.   10 tablet   0   .  Alum & Mag Hydroxide-Simeth (MAGIC MOUTHWASH) SOLN   Oral   Take 5 mLs by mouth 3 (three) times daily.         Marland Kitchen amLODipine (NORVASC) 2.5 MG tablet   Oral   Take 1 tablet (2.5 mg total) by mouth daily.   90 tablet   2   . bethanechol (URECHOLINE) 10 MG tablet   Oral   Take 10 mg by mouth 3 (three) times daily as needed (bladder spasms).         . Cranberry-Vitamin C-Probiotic (AZO CRANBERRY PO)   Oral   Take 1 tablet by mouth 2 (two) times daily as needed (urinary pain).          Marland Kitchen docusate sodium (COLACE) 100 MG capsule   Oral   Take 1 capsule (100 mg total) by mouth 2 (two) times daily.   10 capsule   0   . escitalopram (LEXAPRO) 20 MG tablet   Oral   Take 20 mg by mouth daily.         . feeding supplement (ENSURE COMPLETE) LIQD   Oral   Take 237 mLs by mouth 3 (three) times daily between meals.         . gabapentin (NEURONTIN) 100 MG capsule   Oral   Take 100 mg by mouth 3 (three) times daily.         Marland Kitchen HYDROcodone-acetaminophen (NORCO/VICODIN) 5-325 MG per tablet   Oral   Take 1 tablet by mouth every 6 (six) hours as needed for pain.   10 tablet   0   . HYDROmorphone (DILAUDID) 2 MG tablet   Oral   Take 1 tablet (2 mg total) by mouth every 4 (four) hours as needed for pain.   10 tablet   0   . lidocaine (LIDODERM) 5 %   Transdermal   Place 1 patch onto the skin daily. Remove & Discard patch within 12 hours or as directed by MD//3 patches at a time, 12 hours on and 12 hours off          . metaxalone (SKELAXIN) 800 MG tablet   Oral   Take 800 mg by mouth 2 (two) times daily as needed (muscle spasms).          . morphine (MS CONTIN) 15 MG 12 hr tablet   Oral   Take 1 tablet (15 mg total) by mouth 2 (two) times daily as needed for pain.   10 tablet   0   . pantoprazole (PROTONIX) 40 MG tablet   Oral   Take 1 tablet (40 mg total) by mouth daily at 6 (six) AM.         . polyethylene glycol (MIRALAX / GLYCOLAX) packet   Oral   Take 17 g by mouth 2 (two) times daily.   14 each   0   . rosuvastatin (CRESTOR) 5 MG tablet   Oral   Take 5 mg by mouth at bedtime.         . senna-docusate (SENOKOT-S) 8.6-50 MG per tablet   Oral   Take 1 tablet by mouth 2 (two) times daily.         Marland Kitchen zolpidem (AMBIEN) 10 MG tablet   Oral   Take 1 tablet (10 mg total) by mouth every other day. Alternates with temazepam   10 tablet   0   . nitrofurantoin, macrocrystal-monohydrate, (MACROBID) 100 MG capsule   Oral   Take 1 capsule (100  mg total)  by mouth 2 (two) times daily.   10 capsule   0   . nitroGLYCERIN (NITROSTAT) 0.4 MG SL tablet   Sublingual   Place 1 tablet (0.4 mg total) under the tongue every 5 (five) minutes as needed. Place 1 tablet under tongue for chest pains every 5 minutes up to 3 doses in 15 minutes. If pain persist call 911   25 tablet   prn   . olopatadine (PATANOL) 0.1 % ophthalmic solution   Both Eyes   Place 1 drop into both eyes 2 (two) times daily as needed for allergies.          . phenazopyridine (PYRIDIUM) 200 MG tablet   Oral   Take 1 tablet (200 mg total) by mouth 3 (three) times daily.   15 tablet   0   . valACYclovir (VALTREX) 500 MG tablet   Oral   Take 500 mg by mouth daily as needed.           BP 115/73  Pulse 51  Temp(Src) 99 F (37.2 C) (Oral)  Resp 22  SpO2 98% Physical Exam  Vitals reviewed. Constitutional: She is oriented to person, place, and time. She appears well-developed and well-nourished.  HENT:  Head: Normocephalic and atraumatic.  Right Ear: External ear normal.  Left Ear: External ear normal.  Eyes: Conjunctivae and EOM are normal. Pupils are equal, round, and reactive to light.  Neck: Normal range of motion. Neck supple.  Cardiovascular: Normal rate, regular rhythm, normal heart sounds and intact distal pulses.   Pulmonary/Chest: Effort normal and breath sounds normal.  Abdominal: Soft. Bowel sounds are normal. There is tenderness in the suprapubic area, left upper quadrant and left lower quadrant. There is no CVA tenderness.  Musculoskeletal: Normal range of motion.       Lumbar back: She exhibits tenderness and bony tenderness.  Neurological: She is alert and oriented to person, place, and time.  Skin: Skin is warm and dry.    ED Course  Procedures (including critical care time) Labs Review Labs Reviewed  URINALYSIS, ROUTINE W REFLEX MICROSCOPIC - Abnormal; Notable for the following:    Color, Urine ORANGE (*)    Nitrite POSITIVE (*)     All other components within normal limits  CBC - Abnormal; Notable for the following:    Hemoglobin 11.9 (*)    MCV 77.6 (*)    MCH 25.6 (*)    All other components within normal limits  BASIC METABOLIC PANEL - Abnormal; Notable for the following:    Chloride 95 (*)    Glucose, Bld 156 (*)    Creatinine, Ser 1.13 (*)    GFR calc non Af Amer 55 (*)    GFR calc Af Amer 64 (*)    All other components within normal limits  URINE CULTURE  URINE MICROSCOPIC-ADD ON  URINE RAPID DRUG SCREEN (HOSP PERFORMED)   Results for orders placed during the hospital encounter of 02/15/13  URINALYSIS, ROUTINE W REFLEX MICROSCOPIC      Result Value Range   Color, Urine ORANGE (*) YELLOW   APPearance CLEAR  CLEAR   Specific Gravity, Urine 1.007  1.005 - 1.030   pH 7.5  5.0 - 8.0   Glucose, UA NEGATIVE  NEGATIVE mg/dL   Hgb urine dipstick NEGATIVE  NEGATIVE   Bilirubin Urine NEGATIVE  NEGATIVE   Ketones, ur NEGATIVE  NEGATIVE mg/dL   Protein, ur NEGATIVE  NEGATIVE mg/dL   Urobilinogen, UA 1.0  0.0 - 1.0 mg/dL  Nitrite POSITIVE (*) NEGATIVE   Leukocytes, UA NEGATIVE  NEGATIVE  CBC      Result Value Range   WBC 6.2  4.0 - 10.5 K/uL   RBC 4.64  3.87 - 5.11 MIL/uL   Hemoglobin 11.9 (*) 12.0 - 15.0 g/dL   HCT 40.9  81.1 - 91.4 %   MCV 77.6 (*) 78.0 - 100.0 fL   MCH 25.6 (*) 26.0 - 34.0 pg   MCHC 33.1  30.0 - 36.0 g/dL   RDW 78.2  95.6 - 21.3 %   Platelets 260  150 - 400 K/uL  BASIC METABOLIC PANEL      Result Value Range   Sodium 136  135 - 145 mEq/L   Potassium 4.3  3.5 - 5.1 mEq/L   Chloride 95 (*) 96 - 112 mEq/L   CO2 26  19 - 32 mEq/L   Glucose, Bld 156 (*) 70 - 99 mg/dL   BUN 14  6 - 23 mg/dL   Creatinine, Ser 0.86 (*) 0.50 - 1.10 mg/dL   Calcium 9.5  8.4 - 57.8 mg/dL   GFR calc non Af Amer 55 (*) >90 mL/min   GFR calc Af Amer 64 (*) >90 mL/min  URINE MICROSCOPIC-ADD ON      Result Value Range   WBC, UA 0-2  <3 WBC/hpf   RBC / HPF 0-2  <3 RBC/hpf   Bacteria, UA RARE  RARE    Urine-Other MUCOUS PRESENT      MR LUMBAR SPINE WO CONTRAST   Final Result:         MR Lumbar Spine Wo Contrast (Final result)  Result time: 02/15/13 23:22:18    Final result by Rad Results In Interface (02/15/13 23:22:18)    Narrative:   *RADIOLOGY REPORT*  Clinical Data: Urinary retention.  MRI LUMBAR SPINE WITHOUT CONTRAST  Multiplanar and multiecho pulse sequences of the lumbar spine were obtained without intravenous contrast.  Comparison: None available at time of study interpretation.  Findings: Vertebral bodies and post elements are intact and aligned with maintenance of the lumbar lordosis. Intervertebral discs demonstrate normal morphology and signal characteristics. Minimal subacute discogenic endplate changes L4-5 and L5-S1. No abnormal STIR signal to suggest acute osseous process.  Conus medullaris terminates at T12-L1 appear normal morphology and signal characteristics. Cauda quinine is unremarkable. Incidental note is made of prominent appearance of the right ureter and mild right hydronephrosis. Mild bilateral sacroiliac osteoarthrosis.  Level by level evaluation:  T12-L1, L1-2: No disc bulge, canal stenosis or neural foraminal narrowing.  L2-3: 2 mm far right lateral disc protrusion encroaches upon the exiting right L2 nerve. No canal stenosis. Mild to moderate right neural foraminal narrowing.  L3-4: Minimal annular bulging mild facet arthropathy and ligamentum flavum redundancy. No canal stenosis. Mild left neural foraminal narrowing.  L4-5: Minimal annular bulging. Moderate facet arthropathy and ligamentum flavum redundancy without canal stenosis. Minimal neural foraminal narrowing.  L5-S1: No disc bulge. Mild facet arthropathy and ligamentum flavum redundancy without canal stenosis nor neural foraminal narrowing.  IMPRESSION: No canal stenosis/neurocompressive findings. Incidental note of mild right hydroureteronephrosis.  Degenerative  change of the lumbar spine, with superimposed small right lateral disc protrusion.  Neural foraminal narrowing L2-3 through L4-5: Mild to moderate on the right at L2-3.   Original Report Authenticated By: Awilda Metro     Imaging Review Mr Lumbar Spine Wo Contrast  02/15/2013   *RADIOLOGY REPORT*  Clinical Data: Urinary retention.  MRI LUMBAR SPINE WITHOUT CONTRAST  Multiplanar and multiecho  pulse sequences of the lumbar spine were obtained without intravenous contrast.  Comparison: None available at time of study interpretation.  Findings: Vertebral bodies and post elements are intact and aligned with maintenance of the lumbar lordosis. Intervertebral discs demonstrate normal morphology and signal characteristics.  Minimal subacute discogenic endplate changes L4-5 and L5-S1.  No abnormal STIR signal to suggest acute osseous process.  Conus medullaris terminates at T12-L1 appear normal morphology and signal characteristics.  Cauda quinine is unremarkable.  Incidental note is made of prominent appearance of the right ureter and mild right hydronephrosis.  Mild bilateral sacroiliac osteoarthrosis.  Level by level evaluation:  T12-L1, L1-2:  No disc bulge, canal stenosis or neural foraminal narrowing.  L2-3:  2 mm far right lateral disc protrusion encroaches upon the exiting right L2 nerve.  No canal stenosis.  Mild to moderate right neural foraminal narrowing.  L3-4:  Minimal annular bulging mild facet arthropathy and ligamentum flavum redundancy.  No canal stenosis.  Mild left neural foraminal narrowing.  L4-5:  Minimal annular bulging.  Moderate facet arthropathy and ligamentum flavum redundancy without canal stenosis.  Minimal neural foraminal narrowing.  L5-S1:  No disc bulge.  Mild facet arthropathy and ligamentum flavum redundancy without canal stenosis nor neural foraminal narrowing.  IMPRESSION: No canal stenosis/neurocompressive findings.  Incidental note of mild right hydroureteronephrosis.   Degenerative change of the lumbar spine, with superimposed small right lateral disc protrusion.  Neural foraminal narrowing L2-3 through L4-5:  Mild to moderate on the right at L2-3.   Original Report Authenticated By: Awilda Metro    MDM   1. Urinary retention   2. Urinary tract infection    52 y.o. female  with pertinent PMH of chronic opiod dependency, fibromyalgia presents with urinary retention and abd pain x 2 days.  Pt has chronic abd pain and back pain, however was recently admitted for significant weight loss and dc with foley catheter for urinary retention which began in the hospital.  She returned home and had burning at catheter site, however was otherwise without complaint until she had her catheter removed yesterday, began to have pain, was in and out cathed, but continued to have recurrent dysuria, frequency, and small amount of urination, so she presented here.  She denies fever or hematuria, however has had some nausea.  Also no diarrhea or constipation.  Physical exam today as above with primarily suprapubic tenderness, however some LUQ tenderness radiating down.  No CVA tenderness.  Labs and imaging as above with stable cr, no leukocytosis.  Pt also complaining of significant pain in back, given setting of urinary retention obtained MRI which was unremarkable for cord pathology, however did demonstrate mild hydroureteronephrosis.  UA with UTI.  Spoke with urology who will see pt in fu, however at this time feel pt stable for dc home with macrobid and pyridium.  Doubt stone given recent negative imaging, and also doubt pyelonephritis given lack of fever, CVA tenderness, and overall clinical condition.  Given standard return precautions.    Pain relieved with foley catheterization to normal levels for pt, and she will fu with her normal provider for pain medication.    Labs and imaging as above reviewed by myself and attending,Dr. Radford Pax, with whom case was discussed.   1. Urinary  retention   2. Urinary tract infection       Noel Gerold, MD 02/16/13 1610

## 2013-02-15 NOTE — Telephone Encounter (Signed)
Patient was seen yesterday (02/14/2013) and was given Bethanechol 10 mg (taking once daily) to help her urinate however patient is experiencing burning. She would like something called in to her pharmacy for a UTI. [facility]  [phone]

## 2013-02-15 NOTE — ED Notes (Signed)
Pt left hospital Wednesday to nursing home.  Pt left nursing home Thursday to go home.  [facility] could not come see pt, pt called PCP Friday morning and went into office and FC d/c'd.  Friday afternoon pt was having urinary pain, not able to urinate.  Pt was then sent to urgent care and an in and out cathetization was done.  Was prescribed Bethanechol was prescribed for bladder spasms.  Onset 3:35am Saturday morning pt started having urinary urgency, 5am started having urinary burning and dribbling urine.  As day progressed pt has been only able to dribble urine and having abd pain and rectal pressure.  Pt has been taking AZO standard and cranberry pills today with no relief.

## 2013-02-16 MED ORDER — NITROFURANTOIN MONOHYD MACRO 100 MG PO CAPS: 100.0000 mg | ORAL_CAPSULE | Freq: Two times a day (BID) | ORAL | Status: DC

## 2013-02-16 MED ORDER — NITROFURANTOIN MONOHYD MACRO 100 MG PO CAPS
100.0000 mg | ORAL_CAPSULE | Freq: Once | ORAL | Status: AC
  Administered 2013-02-16: 100 mg via ORAL
  Filled 2013-02-16: qty 1

## 2013-02-16 MED ORDER — PHENAZOPYRIDINE HCL 200 MG PO TABS: 200.0000 mg | ORAL_TABLET | Freq: Three times a day (TID) | ORAL | Status: DC

## 2013-02-16 NOTE — Telephone Encounter (Signed)
She went to the ER and was given Macrobid and foley cath. Called her she states she is better and will f/u with Susann Givens if she needs anything else she will call back.

## 2013-02-16 NOTE — ED Provider Notes (Signed)
I saw and evaluated the patient, reviewed the resident's note and I agree with the findings and plan.   .Face to face Exam:  General:  Awake HEENT:  Atraumatic Resp:  Normal effort Abd:  Nondistended Neuro:No focal weakness   Nelia Shi, MD 02/16/13 1137

## 2013-02-16 NOTE — Telephone Encounter (Signed)
Needs urine culture before any treatment

## 2013-02-17 LAB — URINE CULTURE

## 2013-02-18 ENCOUNTER — Inpatient Hospital Stay: Payer: Medicare Other | Admitting: Family Medicine

## 2013-02-18 ENCOUNTER — Telehealth: Payer: Self-pay | Admitting: Urology

## 2013-02-18 DIAGNOSIS — R339 Retention of urine, unspecified: Secondary | ICD-10-CM | POA: Diagnosis not present

## 2013-02-24 ENCOUNTER — Ambulatory Visit: Payer: Medicare Other | Admitting: Internal Medicine

## 2013-03-04 DIAGNOSIS — N390 Urinary tract infection, site not specified: Secondary | ICD-10-CM | POA: Diagnosis not present

## 2013-03-07 ENCOUNTER — Ambulatory Visit (INDEPENDENT_AMBULATORY_CARE_PROVIDER_SITE_OTHER): Payer: Medicare Other | Admitting: Gastroenterology

## 2013-03-07 ENCOUNTER — Encounter: Payer: Self-pay | Admitting: Gastroenterology

## 2013-03-07 VITALS — BP 104/60 | HR 64 | Ht 66.0 in | Wt 136.0 lb

## 2013-03-07 DIAGNOSIS — K5900 Constipation, unspecified: Secondary | ICD-10-CM

## 2013-03-07 DIAGNOSIS — K3184 Gastroparesis: Secondary | ICD-10-CM | POA: Diagnosis not present

## 2013-03-07 MED ORDER — LINACLOTIDE 145 MCG PO CAPS: 145.0000 ug | ORAL_CAPSULE | Freq: Every day | ORAL | Status: DC

## 2013-03-07 NOTE — Patient Instructions (Addendum)
We will get records sent from your previous gastroenterologist Dr. Loreta Ave for review.  This will include any endoscopic (colonoscopy or upper endoscopy) procedures and any associated pathology reports.   Samples of linzess, take one pill once daily for constipation. You should continue 4-5 small meals daily rather than any large meals. Nuclear medicine gastric emptying scan to check for gastroparesis.  You have been scheduled for a gastric emptying scan at [facility]  on 03/20/13 at 12:45 pm. Please arrive at least 15 minutes prior to your appointment for registration. Please make certain not to have anything to eat or drink after midnight the night before your test. Hold all stomach medications (ex: Zofran, phenergan, Reglan) 48 hours prior to your test. If you need to reschedule your appointment, please contact radiology scheduling at [phone]. _____________________________________________________________________ A gastric-emptying study measures how long it takes for food to move through your stomach. There are several ways to measure stomach emptying. In the most common test, you eat food that contains a small amount of radioactive material. A scanner that detects the movement of the radioactive material is placed over your abdomen to monitor the rate at which food leaves your stomach. This test normally takes about 2 hours to complete. _____________________________________________________________________  Call in 7-10 days to report on your bowels, is linzess helping.  If it helps, then prescription will be written. Start OTC miralax powder, one dose every single day. Please return to see Dr. Christella Hartigan in 4 weeks.

## 2013-03-07 NOTE — Progress Notes (Signed)
Review of pertinent gastrointestinal problems: 1. Post prandial abd pain, weight loss (hospitalized)  Gina Espinoza EGD: 9/2014There was a moderate amount of retained solid and liquid gastric contents without anatomic obstruction. There was mild to moderate distal gastritis which was biopsied to check for H. pylori. The examination was otherwise normal.  Path showed no h. Pylori 2. ? EGD, colonoscopy Dr. Loreta Ave  HPI: This is a   very pleasant 52 year old woman whom I last saw when she was hospitalized last month.  Has has had NO BM at all in 2 weeks.  She has been taking stool softener (white on one side and red on the other).  Has been taking miralax about every other day.  Constipation has been a problem for her for many years. 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  She will take exlax if she doesn't have BM for a month.  Was having post prandial pain.  She will normally eat 1-2 meals per day.  Lately, she is eating smaller, more frequent meals.  She had colonsocopy years ago, nothing was found. She recalls EGD at the same time.  Dr. Charna Elizabeth; she doesn't remember that anything.  She is on chronic narcotic pains for "fibormyalgia pains ALL OVER." she acutally stopped all the narcs after hosp stay last month.    Past Medical History  Diagnosis Date  . Personal history of unspecified circulatory disease   . Chest pain, unspecified   . Unspecified essential hypertension   . Other and unspecified hyperlipidemia     mixed  . Headache(784.0)   . Cough   . Fibromyalgia   . Depression   . Anxiety     Past Surgical History  Procedure Laterality Date  . Hematoma evacuation      vaginal hematoma 05/23/02  . Anterior perineoplasty      posterior repair. 05/16/02  . Bladder suspension    . Cardiac catheterization  2006    normal coronaries  . Esophagogastroduodenoscopy N/A 02/09/2013    Procedure: ESOPHAGOGASTRODUODENOSCOPY (EGD);  Surgeon: Rachael Fee, MD;  Location: [facility] END[facility]COPY;  Service:  Endoscopy;  Laterality: N/A;    Current Outpatient Prescriptions  Medication Sig Dispense Refill  . ALPRAZolam (XANAX) 1 MG tablet Take 1 tablet (1 mg total) by mouth daily as needed for anxiety.  10 tablet  0  . Alum & Mag Hydroxide-Simeth (MAGIC MOUTHWASH) SOLN Take 5 mLs by mouth 3 (three) times daily.      Marland Kitchen amLODipine (NORVASC) 2.5 MG tablet Take 1 tablet (2.5 mg total) by mouth daily.  90 tablet  2  . bethanechol (URECHOLINE) 10 MG tablet Take 10 mg by mouth 3 (three) times daily as needed (bladder spasms).      . Cranberry-Vitamin C-Probiotic (AZO CRANBERRY PO) Take 1 tablet by mouth 2 (two) times daily as needed (urinary pain).      Marland Kitchen docusate sodium (COLACE) 100 MG capsule Take 1 capsule (100 mg total) by mouth 2 (two) times daily.  10 capsule  0  . escitalopram (LEXAPRO) 20 MG tablet Take 20 mg by mouth daily.      . feeding supplement (ENSURE COMPLETE) LIQD Take 237 mLs by mouth 3 (three) times daily between meals.      . gabapentin (NEURONTIN) 100 MG capsule Take 100 mg by mouth 3 (three) times daily.      Marland Kitchen HYDROcodone-acetaminophen (NORCO/VICODIN) 5-325 MG per tablet Take 1 tablet by mouth every 6 (six) hours as needed for pain.  10 tablet  0  .  HYDROmorphone (DILAUDID) 2 MG tablet Take 1 tablet (2 mg total) by mouth every 4 (four) hours as needed for pain.  10 tablet  0  . lidocaine (LIDODERM) 5 % Place 1 patch onto the skin daily. Remove & Discard patch within 12 hours or as directed by MD//3 patches at a time, 12 hours on and 12 hours off       . metaxalone (SKELAXIN) 800 MG tablet Take 800 mg by mouth 2 (two) times daily as needed (muscle spasms).       . morphine (MS CONTIN) 15 MG 12 hr tablet Take 1 tablet (15 mg total) by mouth 2 (two) times daily as needed for pain.  10 tablet  0  . nitrofurantoin, macrocrystal-monohydrate, (MACROBID) 100 MG capsule Take 1 capsule (100 mg total) by mouth 2 (two) times daily.  10 capsule  0  . nitroGLYCERIN (NITROSTAT) 0.4 MG SL tablet Place  1 tablet (0.4 mg total) under the tongue every 5 (five) minutes as needed. Place 1 tablet under tongue for chest pains every 5 minutes up to 3 doses in 15 minutes. If pain persist call 911  25 tablet  prn  . olopatadine (PATANOL) 0.1 % ophthalmic solution Place 1 drop into both eyes 2 (two) times daily as needed for allergies.       . polyethylene glycol (MIRALAX / GLYCOLAX) packet Take 17 g by mouth 2 (two) times daily.  14 each  0  . rosuvastatin (CRESTOR) 5 MG tablet Take 5 mg by mouth at bedtime.      . senna-docusate (SENOKOT-S) 8.6-50 MG per tablet Take 1 tablet by mouth 2 (two) times daily.      . valACYclovir (VALTREX) 500 MG tablet Take 500 mg by mouth daily as needed.       . zolpidem (AMBIEN) 10 MG tablet Take 1 tablet (10 mg total) by mouth every other day. Alternates with temazepam  10 tablet  0  . pantoprazole (PROTONIX) 40 MG tablet Take 1 tablet (40 mg total) by mouth daily at 6 (six) AM.      . phenazopyridine (PYRIDIUM) 200 MG tablet Take 1 tablet (200 mg total) by mouth 3 (three) times daily.  15 tablet  0   No current facility-administered medications for this visit.    Allergies as of 03/07/2013 - Review Complete 03/07/2013  Allergen Reaction Noted  . Savella [milnacipran hcl] Anaphylaxis 10/14/2011  . Bee venom Swelling   . Codeine Hives and Itching 11/05/2008  . Ivp dye [iodinated diagnostic agents] Hives and Itching 10/10/2011    Family History  Problem Relation Age of Onset  . Allergies Mother     also children  . Asthma Daughter   . Heart disease Father   . Breast cancer Maternal Grandmother   . Colon cancer Brother   . Asthma Grandchild     History   Social History  . Marital Status: Single    Spouse Name: N/A    Number of Children: N/A  . Years of Education: N/A   Occupational History  . Not on file.   Social History Main Topics  . Smoking status: Never Smoker   . Smokeless tobacco: Not on file  . Alcohol Use: No  . Drug Use: No  . Sexual  Activity: Not Currently   Other Topics Concern  . Not on file   Social History Narrative   Single, children, works as a Midwife.  On disability (fibromyalgia, heart, depression/anxiety)  Physical Exam: BP 104/60  Pulse 64  Ht 5\' 6"  (1.676 m)  Wt 136 lb (61.689 kg)  BMI 21.96 kg/m2 Constitutional: generally well-appearing Psychiatric: alert and oriented x3 Abdomen: soft, nontender, nondistended, no obvious ascites, no peritoneal signs, normal bowel sounds     Assessment and plan: 51 y.o. female with  chronic constipation, likely gastroparesis  We will records from her previous gastroenterologist regarding colonoscopy, previous upper endoscopy. She had retained food in her stomach at the time of EGD last month. Since then she has been avoiding narcotic pain medicines which I commended her on. Like to get a nuclear medicine gastric emptying scan to try to document gastroparesis. She knows to continue reading 4-5 small meals a day. I would consider Reglan treatment at her next office visit. Predominant focus for her constipation. She'll try MiraLax every day rather than just when necessary. I am also giving her samples of linzes to be taken one pill once daily. She will call to report on her symptoms in 7-10 days and if she has noticed improvement and I will call her in a prescription.

## 2013-03-13 ENCOUNTER — Telehealth: Payer: Self-pay

## 2013-03-13 NOTE — Telephone Encounter (Signed)
Pt called to inform Dr Christella Hartigan that the Linzess he put her on has helped.  She has increased her bowel movements from every 2 weeks to every 4-5 days.  She would like to know if he thinks she should increase the strength?  Pt has enough samples to last through the weekend and is aware Dr Christella Hartigan will be back in the office on Monday.  Please advise

## 2013-03-16 ENCOUNTER — Telehealth: Payer: Self-pay | Admitting: Gastroenterology

## 2013-03-16 NOTE — Telephone Encounter (Signed)
Yes, lets increase her dose to the higher strength linzess.  One pill once daily, disp 30 with 6 refills.  Thanks

## 2013-03-16 NOTE — Telephone Encounter (Signed)
Colonoscopy Dr. Loreta Ave, 02/2007; for guiac Pos stools; "limited by poor prep...large amount of stool in the colon." Findings normal examination to the terminal ileum. Recommended repeat colonoscopy in 10 years.   EGD Dr. Loreta Ave, 02/2007; for dysphagia, findings, normal examination.  I don't agree with 10 year interval described above since the prep was documented to be "poor."    Patty, Can you call her, let her know I reviewed previous colonoscopy and that I think she needs another one at her soonest convenience.  Should have double prep, +MAC day at [facility].  Thanks

## 2013-03-17 MED ORDER — LINACLOTIDE 290 MCG PO CAPS: 290.0000 ug | ORAL_CAPSULE | Freq: Every day | ORAL | Status: DC

## 2013-03-17 NOTE — Telephone Encounter (Signed)
Pt rx has been sent and the pt is aware

## 2013-03-18 ENCOUNTER — Encounter: Payer: Self-pay | Admitting: Gastroenterology

## 2013-03-18 ENCOUNTER — Telehealth: Payer: Self-pay | Admitting: Gastroenterology

## 2013-03-18 NOTE — Telephone Encounter (Signed)
Pt will have the pharmacy send a prior auth and samples have been left at the front desk

## 2013-03-18 NOTE — Telephone Encounter (Signed)
Pt has been notified and scheduled for pre visit and colon  

## 2013-03-20 ENCOUNTER — Encounter (HOSPITAL_COMMUNITY): Payer: Self-pay

## 2013-03-20 ENCOUNTER — Encounter (HOSPITAL_COMMUNITY)
Admission: RE | Admit: 2013-03-20 | Discharge: 2013-03-20 | Disposition: A | Discharge status: Home / Self Care | Payer: Medicare Other | Source: Ambulatory Visit | Attending: Gastroenterology | Admitting: Gastroenterology

## 2013-03-20 DIAGNOSIS — K3184 Gastroparesis: Secondary | ICD-10-CM

## 2013-03-20 DIAGNOSIS — F411 Generalized anxiety disorder: Secondary | ICD-10-CM | POA: Diagnosis not present

## 2013-03-20 DIAGNOSIS — R935 Abnormal findings on diagnostic imaging of other abdominal regions, including retroperitoneum: Secondary | ICD-10-CM | POA: Diagnosis not present

## 2013-03-20 DIAGNOSIS — K5909 Other constipation: Secondary | ICD-10-CM | POA: Insufficient documentation

## 2013-03-20 DIAGNOSIS — IMO0001 Reserved for inherently not codable concepts without codable children: Secondary | ICD-10-CM | POA: Diagnosis not present

## 2013-03-20 MED ORDER — TECHNETIUM TC 99M SULFUR COLLOID
2.0000 | Freq: Once | INTRAVENOUS | Status: AC | PRN
  Administered 2013-03-20: 2 via ORAL

## 2013-03-24 ENCOUNTER — Telehealth: Payer: Self-pay | Admitting: Gastroenterology

## 2013-03-24 NOTE — Telephone Encounter (Signed)
Pt notified and will keep appt.

## 2013-03-24 NOTE — Telephone Encounter (Signed)
Pt states that the Linzess 290 mcg and it is not working any better than the 145 mcg.  She has not had a bowel movement in 3 days.  Also, insurance will not cover Linzess. Please advise

## 2013-03-24 NOTE — Telephone Encounter (Signed)
Ok, she needs to change back to miralax powder, taking 2 doses every single day.

## 2013-04-02 ENCOUNTER — Telehealth: Payer: Self-pay | Admitting: Gastroenterology

## 2013-04-02 DIAGNOSIS — R339 Retention of urine, unspecified: Secondary | ICD-10-CM | POA: Diagnosis not present

## 2013-04-02 DIAGNOSIS — N390 Urinary tract infection, site not specified: Secondary | ICD-10-CM | POA: Diagnosis not present

## 2013-04-02 NOTE — Telephone Encounter (Signed)
I spoke with [facility] Dept and they state that the pt is no longer eligible for their program because she is now on medicare.  I told the pt I would give her samples as often as I could.  She will call in a few days

## 2013-04-02 NOTE — Telephone Encounter (Signed)
[phone] phone for [facility] Dept.  Left message on machine to call back

## 2013-04-15 ENCOUNTER — Ambulatory Visit (INDEPENDENT_AMBULATORY_CARE_PROVIDER_SITE_OTHER): Payer: Medicare Other | Admitting: Gastroenterology

## 2013-04-15 ENCOUNTER — Encounter: Payer: Self-pay | Admitting: Gastroenterology

## 2013-04-15 VITALS — BP 100/60 | HR 60 | Ht 66.0 in | Wt 139.2 lb

## 2013-04-15 DIAGNOSIS — K5900 Constipation, unspecified: Secondary | ICD-10-CM | POA: Diagnosis not present

## 2013-04-15 MED ORDER — MOVIPREP 100 G PO SOLR: 1.0000 | Freq: Once | ORAL | Status: DC

## 2013-04-15 NOTE — Patient Instructions (Signed)
We will contact Linzess maker to see if they have any financial assistance to help. Looking forward to your upcoming colonoscopy next month, double prep.

## 2013-04-15 NOTE — Progress Notes (Signed)
Review of pertinent gastrointestinal problems:  1. Post prandial abd pain, weight loss (hospitalized) Gina Espinoza EGD: 9/2014There was a moderate amount of retained solid and liquid gastric contents without anatomic obstruction. There was mild to moderate distal gastritis which was biopsied to check for H. pylori. The examination was otherwise normal. Path showed no h. Pylori  2. EGD, colonoscopy Dr. Loreta Ave.  Colonoscopy Dr. Loreta Ave, 02/2007; for guiac Pos stools; "limited by poor prep...large amount of stool in the colon." Findings normal examination to the terminal ileum. Recommended repeat colonoscopy in 10 years. EGD Dr. Loreta Ave, 02/2007; for dysphagia, findings, normal examination.  I recommend colonoscopy sooner (2014) given the "poor prep" described by Dr. Loreta Ave. 3. Constipation: improved with linzess, multiple chronic narcotics likely contribute to her constipation  HPI: This is a  very pleasant 52 year old woman whom I last saw about 2 months ago.  The linzess is clearly helping her   She is having BM every day to every other day.  Prior to linzess, she was having BM only every 2-3 weeks with aid of laxatives.     Past Medical History  Diagnosis Date  . Personal history of unspecified circulatory disease   . Chest pain, unspecified   . Unspecified essential hypertension   . Other and unspecified hyperlipidemia     mixed  . Headache(784.0)   . Cough   . Fibromyalgia   . Depression   . Anxiety     Past Surgical History  Procedure Laterality Date  . Hematoma evacuation      vaginal hematoma 05/23/02  . Anterior perineoplasty      posterior repair. 05/16/02  . Bladder suspension    . Cardiac catheterization  2006    normal coronaries  . Esophagogastroduodenoscopy N/A 02/09/2013    Procedure: ESOPHAGOGASTRODUODENOSCOPY (EGD);  Surgeon: Rachael Fee, MD;  Location[facility]COPY;  Service: Endoscopy;  Laterality: N/A;    Current Outpatient Prescriptions  Medication Sig Dispense Refill   . ALPRAZolam (XANAX) 1 MG tablet Take 2 mg by mouth daily as needed for anxiety.      . Alum & Mag Hydroxide-Simeth (MAGIC MOUTHWASH) SOLN Take 5 mLs by mouth 3 (three) times daily.      Marland Kitchen amLODipine (NORVASC) 2.5 MG tablet Take 1 tablet (2.5 mg total) by mouth daily.  90 tablet  2  . bethanechol (URECHOLINE) 10 MG tablet Take 10 mg by mouth 3 (three) times daily as needed (bladder spasms).      . Cranberry-Vitamin C-Probiotic (AZO CRANBERRY PO) Take 1 tablet by mouth 2 (two) times daily as needed (urinary pain).      . diazepam (VALIUM) 5 MG tablet Take 5 mg by mouth every 6 (six) hours as needed for anxiety (1-2 tabs at a time).      Marland Kitchen docusate sodium (COLACE) 100 MG capsule Take 1 capsule (100 mg total) by mouth 2 (two) times daily.  10 capsule  0  . escitalopram (LEXAPRO) 20 MG tablet Take 20 mg by mouth daily.      . feeding supplement (ENSURE COMPLETE) LIQD Take 237 mLs by mouth 3 (three) times daily between meals.      . gabapentin (NEURONTIN) 100 MG capsule Take 100 mg by mouth 3 (three) times daily.      Marland Kitchen HYDROcodone-acetaminophen (NORCO/VICODIN) 5-325 MG per tablet Take 1 tablet by mouth every 6 (six) hours as needed for pain.  10 tablet  0  . HYDROmorphone (DILAUDID) 2 MG tablet Take 1 tablet (2 mg total) by  mouth every 4 (four) hours as needed for pain.  10 tablet  0  . lidocaine (LIDODERM) 5 % Place 1 patch onto the skin daily. Remove & Discard patch within 12 hours or as directed by MD//3 patches at a time, 12 hours on and 12 hours off       . Linaclotide (LINZESS) 290 MCG CAPS capsule Take 1 capsule (290 mcg total) by mouth daily.  30 capsule  6  . metaxalone (SKELAXIN) 800 MG tablet Take 800 mg by mouth 2 (two) times daily as needed (muscle spasms).       . morphine (MS CONTIN) 15 MG 12 hr tablet Take 1 tablet (15 mg total) by mouth 2 (two) times daily as needed for pain.  10 tablet  0  . nitrofurantoin, macrocrystal-monohydrate, (MACROBID) 100 MG capsule Take 1 capsule (100 mg  total) by mouth 2 (two) times daily.  10 capsule  0  . nitroGLYCERIN (NITROSTAT) 0.4 MG SL tablet Place 1 tablet (0.4 mg total) under the tongue every 5 (five) minutes as needed. Place 1 tablet under tongue for chest pains every 5 minutes up to 3 doses in 15 minutes. If pain persist call 911  25 tablet  prn  . olopatadine (PATANOL) 0.1 % ophthalmic solution Place 1 drop into both eyes 2 (two) times daily as needed for allergies.       . pantoprazole (PROTONIX) 40 MG tablet Take 1 tablet (40 mg total) by mouth daily at 6 (six) AM.      . phenazopyridine (PYRIDIUM) 200 MG tablet Take 1 tablet (200 mg total) by mouth 3 (three) times daily.  15 tablet  0  . polyethylene glycol (MIRALAX / GLYCOLAX) packet Take 17 g by mouth 2 (two) times daily.  14 each  0  . rosuvastatin (CRESTOR) 5 MG tablet Take 5 mg by mouth at bedtime.      . senna-docusate (SENOKOT-S) 8.6-50 MG per tablet Take 1 tablet by mouth 2 (two) times daily.      . valACYclovir (VALTREX) 500 MG tablet Take 500 mg by mouth daily as needed.       . zolpidem (AMBIEN) 10 MG tablet Take 1 tablet (10 mg total) by mouth every other day. Alternates with temazepam  10 tablet  0   No current facility-administered medications for this visit.    Allergies as of 04/15/2013 - Review Complete 04/15/2013  Allergen Reaction Noted  . Savella [milnacipran hcl] Anaphylaxis 10/14/2011  . Bee venom Swelling   . Codeine Hives and Itching 11/05/2008  . Ivp dye [iodinated diagnostic agents] Hives and Itching 10/10/2011    Family History  Problem Relation Age of Onset  . Allergies Mother     also children  . Asthma Daughter   . Heart disease Father   . Breast cancer Maternal Grandmother   . Colon cancer Brother   . Asthma Grandchild     History   Social History  . Marital Status: Single    Spouse Name: N/A    Number of Children: 3  . Years of Education: N/A   Occupational History  . disabled    Social History Main Topics  . Smoking status:  Never Smoker   . Smokeless tobacco: Never Used  . Alcohol Use: No  . Drug Use: No  . Sexual Activity: Not Currently   Other Topics Concern  . Not on file   Social History Narrative   Single, children, works as a Midwife.  On disability (  fibromyalgia, heart, depression/anxiety)      Physical Exam: BP 100/60  Pulse 60  Ht 5\' 6"  (1.676 m)  Wt 139 lb 3.2 oz (63.141 kg)  BMI 22.48 kg/m2 Constitutional: generally well-appearing Psychiatric: alert and oriented x3 Abdomen: soft, nontender, nondistended, no obvious ascites, no peritoneal signs, normal bowel sounds     Assessment and plan: 52 y.o. female with chronic constipation  linzess is clearly helping her. She will stay on the pill once daily. She is having difficulty affording it and we will look into any drug company assistance plans which may be offered. We can help her with samples but only periodically. She had a screening colonoscopy about 5 years ago and the prep was described as poor. I recommend repeating the examination now because with a poor prep significant lesions could be missed.

## 2013-04-22 ENCOUNTER — Encounter: Payer: Self-pay | Admitting: Internal Medicine

## 2013-04-22 ENCOUNTER — Ambulatory Visit (INDEPENDENT_AMBULATORY_CARE_PROVIDER_SITE_OTHER): Payer: Medicare Other | Admitting: Family Medicine

## 2013-04-22 VITALS — BP 118/70 | HR 58

## 2013-04-22 DIAGNOSIS — H5710 Ocular pain, unspecified eye: Secondary | ICD-10-CM | POA: Diagnosis not present

## 2013-04-22 DIAGNOSIS — H5712 Ocular pain, left eye: Secondary | ICD-10-CM

## 2013-04-22 DIAGNOSIS — I10 Essential (primary) hypertension: Secondary | ICD-10-CM | POA: Diagnosis not present

## 2013-04-22 MED ORDER — AMLODIPINE BESYLATE 2.5 MG PO TABS: 2.5000 mg | ORAL_TABLET | Freq: Every day | ORAL | Status: DC

## 2013-04-22 MED ORDER — ROSUVASTATIN CALCIUM 5 MG PO TABS: 5.0000 mg | ORAL_TABLET | Freq: Every day | ORAL | Status: DC

## 2013-04-22 NOTE — Patient Instructions (Signed)
Call in the morning if still having difficulty for referral to ophthalmology

## 2013-04-22 NOTE — Progress Notes (Signed)
   Subjective:    Patient ID: Gina Espinoza, female    DOB: [birth year], 52 y.o.   MRN: [id number]  HPI She noted redness in her left eye yesterday but no other symptoms. Today she is also noted some discomfort. She used Visine which made it hurt worse. She does not complain of photophobia. She would also like a refill on her Norvasc and Crestor. This was previously prescribed by her cardiologist.  Review of Systems     Objective:   Physical Exam Exam of the left eye shows minimal injection. No drainage is noted. Slight tenderness to palpation of the eye is noted. Anterior chamber appears normal.       Assessment & Plan:  HYPERTENSION, UNSPECIFIED - Plan: amLODipine (NORVASC) 2.5 MG tablet  Left eye pain  discussed ophthalmology referral however she cannot go today. She is to call in the morning if she still having difficulty and an appointment will be made. There is a question of whether this is iritis.

## 2013-04-23 ENCOUNTER — Telehealth: Payer: Self-pay | Admitting: Family Medicine

## 2013-04-23 DIAGNOSIS — H109 Unspecified conjunctivitis: Secondary | ICD-10-CM | POA: Diagnosis not present

## 2013-04-23 NOTE — Telephone Encounter (Signed)
Pt called and states she can go to eye doctor today and wants referral.  Pt phone [phone]

## 2013-04-23 NOTE — Telephone Encounter (Signed)
Pt has an appt with Manning [facility]ity @ 3:15pm today. Pt is aware of appt

## 2013-04-24 ENCOUNTER — Telehealth: Payer: Self-pay | Admitting: Gastroenterology

## 2013-04-24 NOTE — Telephone Encounter (Signed)
Pt called and notified that a free movi voucher has been left at the front desk

## 2013-04-25 ENCOUNTER — Encounter: Payer: Self-pay | Admitting: Internal Medicine

## 2013-04-29 NOTE — Telephone Encounter (Signed)
Encounter opened in error

## 2013-04-30 DIAGNOSIS — H109 Unspecified conjunctivitis: Secondary | ICD-10-CM | POA: Diagnosis not present

## 2013-05-09 ENCOUNTER — Ambulatory Visit (AMBULATORY_SURGERY_CENTER): Payer: Medicare Other | Admitting: Gastroenterology

## 2013-05-09 ENCOUNTER — Encounter: Payer: Self-pay | Admitting: Gastroenterology

## 2013-05-09 VITALS — BP 119/67 | HR 52 | Temp 96.4°F | Resp 18 | Ht 66.0 in | Wt 139.0 lb

## 2013-05-09 DIAGNOSIS — D126 Benign neoplasm of colon, unspecified: Secondary | ICD-10-CM | POA: Diagnosis not present

## 2013-05-09 DIAGNOSIS — K5900 Constipation, unspecified: Secondary | ICD-10-CM | POA: Diagnosis not present

## 2013-05-09 DIAGNOSIS — K5730 Diverticulosis of large intestine without perforation or abscess without bleeding: Secondary | ICD-10-CM

## 2013-05-09 MED ORDER — SODIUM CHLORIDE 0.9 % IV SOLN: 500.0000 mL | INTRAVENOUS | Status: DC

## 2013-05-09 NOTE — Op Note (Signed)
[facility] [address] [place], [address]   COLONOSCOPY PROCEDURE REPORT  PATIENT: Nisreen, Guise  MR#: [id number] BIRTHDATE: Jan 05, 1961 , 52  yrs. old GENDER: Female ENDOSCOPIST: Rachael Fee, MD PROCEDURE DATE:  05/09/2013 PROCEDURE:   Colonoscopy with snare polypectomy First Screening Colonoscopy - Avg.  risk and is 50 yrs.  old or older - No.  Prior Negative Screening - Now for repeat screening. Inadequate prep  History of Adenoma - Now for follow-up colonoscopy & has been > or = to 3 yrs.  N/A  Polyps Removed Today? Yes. ASA CLASS:   Class II INDICATIONS:"poor prep" on screening examination done [address] MEDICATIONS: Fentanyl 100 mcg IV, Versed 10 mg IV, and These medications were titrated to patient response per physician's verbal order  DESCRIPTION OF PROCEDURE:   After the risks benefits and alternatives of the procedure were thoroughly explained, informed consent was obtained.  A digital rectal exam revealed no abnormalities of the rectum.   The LB [id number]  endoscope was introduced through the anus and advanced to the cecum, which was identified by both the appendix and ileocecal valve. No adverse events experienced.   The quality of the prep was good.  The instrument was then slowly withdrawn as the colon was fully examined.  COLON FINDINGS: One polyp was found, removed and sent to pathology. This was 3mm across, sessile, located in descending colon, removed with cold snare.  There were a few very small diverticulum in the left colon.  The examination was otherwise normal.  Retroflexed views revealed no abnormalities. The time to cecum=4 minutes 06 seconds.  Withdrawal time=10 minutes 31 seconds.  The scope was withdrawn and the procedure completed. COMPLICATIONS: There were no complications.  ENDOSCOPIC IMPRESSION: One polyp was found, removed and sent to pathology. There were a few very small diverticulum in the left  colon. The examination was otherwise normal.  RECOMMENDATIONS: If the polyp(s) removed today are proven to be adenomatous (pre-cancerous) polyps, you will need a repeat colonoscopy in 5 years.  Otherwise you should continue to follow colorectal cancer screening guidelines for "routine risk" patients with colonoscopy in 10 years.  You will receive a letter within 1-2 weeks with the results of your biopsy as well as final recommendations.  Please call my office if you have not received a letter after 3 weeks.   eSigned:  Rachael Fee, MD 05/09/2013 8:58 AM   cc: Sharlot Gowda, MD

## 2013-05-09 NOTE — Patient Instructions (Signed)
YOU HAD AN ENDOSCOPIC PROCEDURE TODAY AT THE [facility]: Refer to the procedure report that was given to you for any specific questions about what was found during the examination.  If the procedure report does not answer your questions, please call your gastroenterologist to clarify.  If you requested that your care partner not be given the details of your procedure findings, then the procedure report has been included in a sealed envelope for you to review at your convenience later.  YOU SHOULD EXPECT: Some feelings of bloating in the abdomen. Passage of more gas than usual.  Walking can help get rid of the air that was put into your GI tract during the procedure and reduce the bloating. If you had a lower endoscopy (such as a colonoscopy or flexible sigmoidoscopy) you may notice spotting of blood in your stool or on the toilet paper. If you underwent a bowel prep for your procedure, then you may not have a normal bowel movement for a few days.  DIET: Your first meal following the procedure should be a light meal and then it is ok to progress to your normal diet.  A half-sandwich or bowl of soup is an example of a good first meal.  Heavy or fried foods are harder to digest and may make you feel nauseous or bloated.  Likewise meals heavy in dairy and vegetables can cause extra gas to form and this can also increase the bloating.  Drink plenty of fluids but you should avoid alcoholic beverages for 24 hours.  ACTIVITY: Your care partner should take you home directly after the procedure.  You should plan to take it easy, moving slowly for the rest of the day.  You can resume normal activity the day after the procedure however you should NOT DRIVE or use heavy machinery for 24 hours (because of the sedation medicines used during the test).    SYMPTOMS TO REPORT IMMEDIATELY: A gastroenterologist can be reached at any hour.  During normal business hours, 8:30 AM to 5:00 PM Monday through Friday,  call [phone].  After hours and on weekends, please call the GI answering service at [phone] who will take a message and have the physician on call contact you.   Following lower endoscopy (colonoscopy or flexible sigmoidoscopy):  Excessive amounts of blood in the stool  Significant tenderness or worsening of abdominal pains  Swelling of the abdomen that is new, acute  Fever of 100F or higher  FOLLOW UP: If any biopsies were taken you will be contacted by phone or by letter within the next 1-3 weeks.  Call your gastroenterologist if you have not heard about the biopsies in 3 weeks.  Our staff will call the home number listed on your records the next business day following your procedure to check on you and address any questions or concerns that you may have at that time regarding the information given to you following your procedure. This is a courtesy call and so if there is no answer at the home number and we have not heard from you through the emergency physician on call, we will assume that you have returned to your regular daily activities without incident.  SIGNATURES/CONFIDENTIALITY: You and/or your care partner have signed paperwork which will be entered into your electronic medical record.  These signatures attest to the fact that that the information above on your After Visit Summary has been reviewed and is understood.  Full responsibility of the confidentiality of this   discharge information lies with you and/or your care-partner.  Recommendations See procedure report  

## 2013-05-12 ENCOUNTER — Telehealth: Payer: Self-pay | Admitting: *Deleted

## 2013-05-12 NOTE — Telephone Encounter (Signed)
  Follow up Call-  Call back number 05/09/2013  Post procedure Call Back phone  # [phone]  Permission to leave phone message Yes     Patient questions:  Do you have a fever, pain , or abdominal swelling? no Pain Score  0 *  Have you tolerated food without any problems? yes  Have you been able to return to your normal activities? yes  Do you have any questions about your discharge instructions: Diet   no Medications  no Follow up visit  no  Do you have questions or concerns about your Care? no  Actions: * If pain score is 4 or above: No action needed, pain <4.

## 2013-05-14 ENCOUNTER — Encounter: Payer: Self-pay | Admitting: Gastroenterology

## 2013-06-02 ENCOUNTER — Telehealth: Payer: Self-pay | Admitting: Gastroenterology

## 2013-06-02 NOTE — Telephone Encounter (Signed)
Pt samples have been left at the front desk for pick up

## 2013-06-05 DIAGNOSIS — R5383 Other fatigue: Secondary | ICD-10-CM | POA: Diagnosis not present

## 2013-06-05 DIAGNOSIS — M1710 Unilateral primary osteoarthritis, unspecified knee: Secondary | ICD-10-CM | POA: Diagnosis not present

## 2013-06-05 DIAGNOSIS — R5381 Other malaise: Secondary | ICD-10-CM | POA: Diagnosis not present

## 2013-06-05 DIAGNOSIS — G4700 Insomnia, unspecified: Secondary | ICD-10-CM | POA: Diagnosis not present

## 2013-06-05 DIAGNOSIS — IMO0001 Reserved for inherently not codable concepts without codable children: Secondary | ICD-10-CM | POA: Diagnosis not present

## 2013-06-18 DIAGNOSIS — IMO0001 Reserved for inherently not codable concepts without codable children: Secondary | ICD-10-CM | POA: Diagnosis not present

## 2013-06-18 DIAGNOSIS — R7309 Other abnormal glucose: Secondary | ICD-10-CM | POA: Diagnosis not present

## 2013-06-18 DIAGNOSIS — E162 Hypoglycemia, unspecified: Secondary | ICD-10-CM | POA: Diagnosis not present

## 2013-06-18 LAB — HM COLONOSCOPY: HM COLON: NORMAL

## 2013-06-19 DIAGNOSIS — F0630 Mood disorder due to known physiological condition, unspecified: Secondary | ICD-10-CM | POA: Diagnosis not present

## 2013-06-23 ENCOUNTER — Encounter: Payer: Self-pay | Admitting: Family Medicine

## 2013-06-23 ENCOUNTER — Ambulatory Visit
Admission: RE | Admit: 2013-06-23 | Discharge: 2013-06-23 | Disposition: A | Discharge status: Home / Self Care | Payer: Medicare Other | Source: Ambulatory Visit | Attending: Family Medicine | Admitting: Family Medicine

## 2013-06-23 ENCOUNTER — Ambulatory Visit (INDEPENDENT_AMBULATORY_CARE_PROVIDER_SITE_OTHER): Payer: Medicare Other | Admitting: Family Medicine

## 2013-06-23 VITALS — BP 122/80 | HR 68 | Wt 145.0 lb

## 2013-06-23 DIAGNOSIS — R05 Cough: Secondary | ICD-10-CM | POA: Diagnosis not present

## 2013-06-23 DIAGNOSIS — IMO0001 Reserved for inherently not codable concepts without codable children: Secondary | ICD-10-CM

## 2013-06-23 DIAGNOSIS — R059 Cough, unspecified: Secondary | ICD-10-CM | POA: Diagnosis not present

## 2013-06-23 DIAGNOSIS — R0781 Pleurodynia: Secondary | ICD-10-CM

## 2013-06-23 DIAGNOSIS — M797 Fibromyalgia: Secondary | ICD-10-CM

## 2013-06-23 DIAGNOSIS — R079 Chest pain, unspecified: Secondary | ICD-10-CM | POA: Diagnosis not present

## 2013-06-23 DIAGNOSIS — R071 Chest pain on breathing: Secondary | ICD-10-CM

## 2013-06-23 DIAGNOSIS — R509 Fever, unspecified: Secondary | ICD-10-CM | POA: Diagnosis not present

## 2013-06-23 MED ORDER — KETOROLAC TROMETHAMINE 60 MG/2ML IM SOLN
60.0000 mg | Freq: Once | INTRAMUSCULAR | Status: AC
  Administered 2013-06-23: 60 mg via INTRAMUSCULAR

## 2013-06-23 NOTE — Progress Notes (Signed)
   Subjective:    Patient ID: Gina Espinoza, female    DOB: 11-Jan-1961, 53 y.o.   MRN: [id number]  HPI She is here for evaluation of a one-day history of chills, pleuritic left shoulder pain, neck pain, headache, left arm and leg pain for one day. She has underlying history of fibromyalgia and is on multiple pain medications including Dilaudid. No sore throat, congestion, earache. No complaint today of abdominal pain.   Review of Systems     Objective:   Physical Exam alert and in no distress. Tympanic membranes and canals are normal. Throat is clear. Tonsils are normal. Neck is supple without adenopathy or thyromegaly. Cardiac exam shows a regular sinus rhythm without murmurs or gallops. Lungs are clear to auscultation. She did complain of pain on motion of the shoulder with some tenderness to palpation in the lower scapular area. X-ray shows no acute changes        Assessment & Plan:  Pleuritic chest pain - Plan: ketorolac (TORADOL) injection 60 mg, DG Chest 2 View  Fibromyalgia syndrome  recommend she use whatever pain medication works the best and premedicate herself with a nausea medicine since she does have difficulty with these medicines causing abdominal distress.

## 2013-06-23 NOTE — Patient Instructions (Signed)
If the shot does not help go ahead and use a nausea medicine and pain medication that works the best for you that you already have

## 2013-06-25 DIAGNOSIS — F0630 Mood disorder due to known physiological condition, unspecified: Secondary | ICD-10-CM | POA: Diagnosis not present

## 2013-07-02 DIAGNOSIS — F0630 Mood disorder due to known physiological condition, unspecified: Secondary | ICD-10-CM | POA: Diagnosis not present

## 2013-07-09 DIAGNOSIS — F0630 Mood disorder due to known physiological condition, unspecified: Secondary | ICD-10-CM | POA: Diagnosis not present

## 2013-07-14 ENCOUNTER — Other Ambulatory Visit: Payer: Self-pay

## 2013-07-14 DIAGNOSIS — R079 Chest pain, unspecified: Secondary | ICD-10-CM

## 2013-07-14 MED ORDER — NITROGLYCERIN 0.4 MG SL SUBL: 0.4000 mg | SUBLINGUAL_TABLET | SUBLINGUAL | Status: DC | PRN

## 2013-07-23 DIAGNOSIS — F0630 Mood disorder due to known physiological condition, unspecified: Secondary | ICD-10-CM | POA: Diagnosis not present

## 2013-07-24 DIAGNOSIS — IMO0001 Reserved for inherently not codable concepts without codable children: Secondary | ICD-10-CM | POA: Diagnosis not present

## 2013-07-30 DIAGNOSIS — F0630 Mood disorder due to known physiological condition, unspecified: Secondary | ICD-10-CM | POA: Diagnosis not present

## 2013-09-22 DIAGNOSIS — IMO0001 Reserved for inherently not codable concepts without codable children: Secondary | ICD-10-CM | POA: Diagnosis not present

## 2013-10-25 ENCOUNTER — Encounter (HOSPITAL_COMMUNITY): Payer: Self-pay | Admitting: Emergency Medicine

## 2013-10-25 ENCOUNTER — Emergency Department (INDEPENDENT_AMBULATORY_CARE_PROVIDER_SITE_OTHER)
Admission: EM | Admit: 2013-10-25 | Discharge: 2013-10-25 | Disposition: A | Discharge status: Home / Self Care | Payer: Medicare Other | Source: Home / Self Care | Attending: Family Medicine | Admitting: Family Medicine

## 2013-10-25 ENCOUNTER — Other Ambulatory Visit (HOSPITAL_COMMUNITY)
Admission: RE | Admit: 2013-10-25 | Discharge: 2013-10-25 | Disposition: A | Discharge status: Home / Self Care | Payer: Medicare Other | Source: Ambulatory Visit | Attending: Family Medicine | Admitting: Family Medicine

## 2013-10-25 DIAGNOSIS — Z113 Encounter for screening for infections with a predominantly sexual mode of transmission: Secondary | ICD-10-CM | POA: Diagnosis not present

## 2013-10-25 DIAGNOSIS — N760 Acute vaginitis: Secondary | ICD-10-CM | POA: Insufficient documentation

## 2013-10-25 LAB — POCT URINALYSIS DIP (DEVICE)
Bilirubin Urine: NEGATIVE
GLUCOSE, UA: NEGATIVE mg/dL
Hgb urine dipstick: NEGATIVE
KETONES UR: NEGATIVE mg/dL
Leukocytes, UA: NEGATIVE
Nitrite: NEGATIVE
Protein, ur: NEGATIVE mg/dL
Specific Gravity, Urine: 1.02 (ref 1.005–1.030)
Urobilinogen, UA: 0.2 mg/dL (ref 0.0–1.0)
pH: 7 (ref 5.0–8.0)

## 2013-10-25 MED ORDER — METRONIDAZOLE 500 MG PO TABS: 500.0000 mg | ORAL_TABLET | Freq: Two times a day (BID) | ORAL | Status: DC

## 2013-10-25 MED ORDER — FLUCONAZOLE 150 MG PO TABS: 150.0000 mg | ORAL_TABLET | Freq: Once | ORAL | Status: DC

## 2013-10-25 NOTE — ED Provider Notes (Signed)
Gina Espinoza is a 53 y.o. female who presents to Urgent Care today for vaginal discharge and left lower corner and pelvic pain starting about 3 days ago. No fevers chills nausea vomiting or diarrhea. Symptoms are moderate. She has not tried any medications. She feels well otherwise. No urinary symptoms. No blood in the stool.   Past Medical History  Diagnosis Date  . Personal history of unspecified circulatory disease   . Chest pain, unspecified   . Unspecified essential hypertension   . Other and unspecified hyperlipidemia     mixed  . Headache(784.0)   . Cough   . Fibromyalgia   . Depression   . Anxiety    History  Substance Use Topics  . Smoking status: Never Smoker   . Smokeless tobacco: Never Used  . Alcohol Use: No   ROS as above Medications: No current facility-administered medications for this encounter.   Current Outpatient Prescriptions  Medication Sig Dispense Refill  . ALPRAZolam (XANAX) 1 MG tablet Take 2 mg by mouth daily as needed for anxiety.      Marland Kitchen amLODipine (NORVASC) 2.5 MG tablet Take 1 tablet (2.5 mg total) by mouth daily.  90 tablet  3  . bethanechol (URECHOLINE) 10 MG tablet Take 10 mg by mouth 3 (three) times daily as needed (bladder spasms).      . diazepam (VALIUM) 5 MG tablet Take 5 mg by mouth every 6 (six) hours as needed for anxiety (1-2 tabs at a time).      Marland Kitchen escitalopram (LEXAPRO) 20 MG tablet Take 20 mg by mouth daily.      Marland Kitchen gabapentin (NEURONTIN) 100 MG capsule Take 100 mg by mouth 3 (three) times daily.      . Linaclotide (LINZESS) 290 MCG CAPS capsule Take 1 capsule (290 mcg total) by mouth daily.  30 capsule  6  . rosuvastatin (CRESTOR) 5 MG tablet Take 1 tablet (5 mg total) by mouth at bedtime.  90 tablet  3  . valACYclovir (VALTREX) 500 MG tablet Take 500 mg by mouth daily as needed.       . zolpidem (AMBIEN) 10 MG tablet Take 1 tablet (10 mg total) by mouth every other day. Alternates with temazepam  10 tablet  0  . docusate sodium  (COLACE) 100 MG capsule Take 1 capsule (100 mg total) by mouth 2 (two) times daily.  10 capsule  0  . feeding supplement (ENSURE COMPLETE) LIQD Take 237 mLs by mouth 3 (three) times daily between meals.      Marland Kitchen HYDROcodone-acetaminophen (NORCO/VICODIN) 5-325 MG per tablet Take 1 tablet by mouth every 6 (six) hours as needed for pain.  10 tablet  0  . HYDROmorphone (DILAUDID) 2 MG tablet Take 1 tablet (2 mg total) by mouth every 4 (four) hours as needed for pain.  10 tablet  0  . lidocaine (LIDODERM) 5 % Place 1 patch onto the skin daily. Remove & Discard patch within 12 hours or as directed by MD//3 patches at a time, 12 hours on and 12 hours off       . metaxalone (SKELAXIN) 800 MG tablet Take 800 mg by mouth 2 (two) times daily as needed (muscle spasms).       . metroNIDAZOLE (FLAGYL) 500 MG tablet Take 1 tablet (500 mg total) by mouth 2 (two) times daily.  14 tablet  0  . morphine (MS CONTIN) 15 MG 12 hr tablet Take 1 tablet (15 mg total) by mouth 2 (two) times  daily as needed for pain.  10 tablet  0  . nitrofurantoin, macrocrystal-monohydrate, (MACROBID) 100 MG capsule Take 1 capsule (100 mg total) by mouth 2 (two) times daily.  10 capsule  0  . nitroGLYCERIN (NITROSTAT) 0.4 MG SL tablet Place 1 tablet (0.4 mg total) under the tongue every 5 (five) minutes as needed. if pain persist call 911  25 tablet  3  . olopatadine (PATANOL) 0.1 % ophthalmic solution Place 1 drop into both eyes 2 (two) times daily as needed for allergies.       . OXYCODONE HCL ER PO Take by mouth.      . pantoprazole (PROTONIX) 40 MG tablet Take 1 tablet (40 mg total) by mouth daily at 6 (six) AM.      . phenazopyridine (PYRIDIUM) 200 MG tablet Take 1 tablet (200 mg total) by mouth 3 (three) times daily.  15 tablet  0  . polyethylene glycol (MIRALAX / GLYCOLAX) packet Take 17 g by mouth 2 (two) times daily.  14 each  0  . senna-docusate (SENOKOT-S) 8.6-50 MG per tablet Take 1 tablet by mouth 2 (two) times daily.         Exam:  BP 118/76  Pulse 57  Temp(Src) 98.3 F (36.8 C) (Oral)  Resp 16  SpO2 100% Gen: Well NAD HEENT: EOMI,  MMM Lungs: Normal work of breathing. CTABL Heart: RRR no MRG Abd: NABS, Soft. NT, ND no rebound or guarding Exts: Brisk capillary refill, warm and well perfused.  GYN: Normal external genitalia. Vaginal canal with thin white discharge. Mild tenderness without mass left adnexa. Normal right. No cervical motion tenderness.  Results for orders placed during the hospital encounter of 10/25/13 (from the past 24 hour(s))  POCT URINALYSIS DIP (DEVICE)     Status: None   Collection Time    10/25/13 12:11 PM      Result Value Ref Range   Glucose, UA NEGATIVE  NEGATIVE mg/dL   Bilirubin Urine NEGATIVE  NEGATIVE   Ketones, ur NEGATIVE  NEGATIVE mg/dL   Specific Gravity, Urine 1.020  1.005 - 1.030   Hgb urine dipstick NEGATIVE  NEGATIVE   pH 7.0  5.0 - 8.0   Protein, ur NEGATIVE  NEGATIVE mg/dL   Urobilinogen, UA 0.2  0.0 - 1.0 mg/dL   Nitrite NEGATIVE  NEGATIVE   Leukocytes, UA NEGATIVE  NEGATIVE   No results found.  Assessment and Plan: 53 y.o. female with vaginal discharge with mild pelvic pain. Plan to treat with metronidazole empirically. Cytology pending. Followup with OB/GYN or primary care provider.  Discussed warning signs or symptoms. Please see discharge instructions. Patient expresses understanding.    Gregor Hams, MD 10/25/13 1314

## 2013-10-25 NOTE — Discharge Instructions (Signed)
Thank you for coming in today. Take flagyl for 1 week. Do not take with alcohol.  Use tylenol for pain as needed.  Follow up with your doctor or OBGYN.  If your belly pain worsens, or you have high fever, bad vomiting, blood in your stool or black tarry stool go to the Emergency Room.    Bacterial Vaginosis Bacterial vaginosis is a vaginal infection that occurs when the normal balance of bacteria in the vagina is disrupted. It results from an overgrowth of certain bacteria. This is the most common vaginal infection in women of childbearing age. Treatment is important to prevent complications, especially in pregnant women, as it can cause a premature delivery. CAUSES  Bacterial vaginosis is caused by an increase in harmful bacteria that are normally present in smaller amounts in the vagina. Several different kinds of bacteria can cause bacterial vaginosis. However, the reason that the condition develops is not fully understood. RISK FACTORS Certain activities or behaviors can put you at an increased risk of developing bacterial vaginosis, including:  Having a new sex partner or multiple sex partners.  Douching.  Using an intrauterine device (IUD) for contraception. Women do not get bacterial vaginosis from toilet seats, bedding, swimming pools, or contact with objects around them. SIGNS AND SYMPTOMS  Some women with bacterial vaginosis have no signs or symptoms. Common symptoms include:  Grey vaginal discharge.  A fishlike odor with discharge, especially after sexual intercourse.  Itching or burning of the vagina and vulva.  Burning or pain with urination. DIAGNOSIS  Your health care provider will take a medical history and examine the vagina for signs of bacterial vaginosis. A sample of vaginal fluid may be taken. Your health care provider will look at this sample under a microscope to check for bacteria and abnormal cells. A vaginal pH test may also be done.  TREATMENT  Bacterial  vaginosis may be treated with antibiotic medicines. These may be given in the form of a pill or a vaginal cream. A second round of antibiotics may be prescribed if the condition comes back after treatment.  HOME CARE INSTRUCTIONS   Only take over-the-counter or prescription medicines as directed by your health care provider.  If antibiotic medicine was prescribed, take it as directed. Make sure you finish it even if you start to feel better.  Do not have sex until treatment is completed.  Tell all sexual partners that you have a vaginal infection. They should see their health care provider and be treated if they have problems, such as a mild rash or itching.  Practice safe sex by using condoms and only having one sex partner. SEEK MEDICAL CARE IF:   Your symptoms are not improving after 3 days of treatment.  You have increased discharge or pain.  You have a fever. MAKE SURE YOU:   Understand these instructions.  Will watch your condition.  Will get help right away if you are not doing well or get worse. FOR MORE INFORMATION  Centers for Disease Control and Prevention, Division of STD Prevention: [employer] American Sexual Health Association (ASHA): www.ashastd.org  Document Released: 05/08/2005 Document Revised: 02/26/2013 Document Reviewed: 12/18/2012 [facility] Patient Information 2014 [place].

## 2013-10-25 NOTE — ED Notes (Signed)
C/o vaginal dischage and lower back/flank pain since Thursday.  Denies vaginal irritation and odor.  No otc treatments used.

## 2013-11-19 HISTORY — PX: OTHER SURGICAL HISTORY: SHX169

## 2013-12-04 DIAGNOSIS — R5381 Other malaise: Secondary | ICD-10-CM | POA: Diagnosis not present

## 2013-12-04 DIAGNOSIS — R5383 Other fatigue: Secondary | ICD-10-CM | POA: Diagnosis not present

## 2013-12-04 DIAGNOSIS — IMO0001 Reserved for inherently not codable concepts without codable children: Secondary | ICD-10-CM | POA: Diagnosis not present

## 2013-12-04 DIAGNOSIS — R51 Headache: Secondary | ICD-10-CM | POA: Diagnosis not present

## 2013-12-04 DIAGNOSIS — G4700 Insomnia, unspecified: Secondary | ICD-10-CM | POA: Diagnosis not present

## 2013-12-05 DIAGNOSIS — E559 Vitamin D deficiency, unspecified: Secondary | ICD-10-CM | POA: Diagnosis not present

## 2013-12-05 DIAGNOSIS — Z79899 Other long term (current) drug therapy: Secondary | ICD-10-CM | POA: Diagnosis not present

## 2013-12-16 ENCOUNTER — Other Ambulatory Visit: Payer: Self-pay | Admitting: Obstetrics and Gynecology

## 2013-12-16 DIAGNOSIS — Z130 Encounter for screening for diseases of the blood and blood-forming organs and certain disorders involving the immune mechanism: Secondary | ICD-10-CM | POA: Diagnosis not present

## 2013-12-16 DIAGNOSIS — Z1231 Encounter for screening mammogram for malignant neoplasm of breast: Secondary | ICD-10-CM | POA: Diagnosis not present

## 2013-12-16 DIAGNOSIS — Z01419 Encounter for gynecological examination (general) (routine) without abnormal findings: Secondary | ICD-10-CM | POA: Diagnosis not present

## 2013-12-16 DIAGNOSIS — N390 Urinary tract infection, site not specified: Secondary | ICD-10-CM | POA: Diagnosis not present

## 2013-12-16 LAB — HM MAMMOGRAPHY: HM MAMMO: NORMAL

## 2013-12-16 LAB — HM PAP SMEAR: HM PAP: NORMAL

## 2013-12-17 LAB — CYTOLOGY - PAP

## 2014-03-09 ENCOUNTER — Telehealth: Payer: Self-pay | Admitting: Gastroenterology

## 2014-03-10 MED ORDER — LINACLOTIDE 290 MCG PO CAPS: 290.0000 ug | ORAL_CAPSULE | Freq: Every day | ORAL | Status: DC

## 2014-03-10 NOTE — Telephone Encounter (Signed)
rx has been sent as requested 

## 2014-03-19 DIAGNOSIS — E559 Vitamin D deficiency, unspecified: Secondary | ICD-10-CM | POA: Diagnosis not present

## 2014-03-23 DIAGNOSIS — M797 Fibromyalgia: Secondary | ICD-10-CM | POA: Diagnosis not present

## 2014-04-02 DIAGNOSIS — R3916 Straining to void: Secondary | ICD-10-CM | POA: Diagnosis not present

## 2014-05-13 ENCOUNTER — Other Ambulatory Visit: Payer: Self-pay | Admitting: Family Medicine

## 2014-05-14 ENCOUNTER — Emergency Department (INDEPENDENT_AMBULATORY_CARE_PROVIDER_SITE_OTHER)
Admission: EM | Admit: 2014-05-14 | Discharge: 2014-05-14 | Disposition: A | Discharge status: Home / Self Care | Payer: Medicare Other | Source: Home / Self Care | Attending: Emergency Medicine | Admitting: Emergency Medicine

## 2014-05-14 ENCOUNTER — Other Ambulatory Visit (HOSPITAL_COMMUNITY)
Admission: RE | Admit: 2014-05-14 | Discharge: 2014-05-14 | Disposition: A | Discharge status: Home / Self Care | Payer: Medicare Other | Source: Ambulatory Visit | Attending: Emergency Medicine | Admitting: Emergency Medicine

## 2014-05-14 ENCOUNTER — Encounter (HOSPITAL_COMMUNITY): Payer: Self-pay | Admitting: Emergency Medicine

## 2014-05-14 DIAGNOSIS — Z113 Encounter for screening for infections with a predominantly sexual mode of transmission: Secondary | ICD-10-CM | POA: Insufficient documentation

## 2014-05-14 DIAGNOSIS — K140 Glossitis: Secondary | ICD-10-CM

## 2014-05-14 DIAGNOSIS — N760 Acute vaginitis: Secondary | ICD-10-CM | POA: Diagnosis not present

## 2014-05-14 DIAGNOSIS — L729 Follicular cyst of the skin and subcutaneous tissue, unspecified: Secondary | ICD-10-CM | POA: Diagnosis not present

## 2014-05-14 LAB — POCT URINALYSIS DIP (DEVICE)
Bilirubin Urine: NEGATIVE
GLUCOSE, UA: NEGATIVE mg/dL
Hgb urine dipstick: NEGATIVE
Ketones, ur: NEGATIVE mg/dL
LEUKOCYTES UA: NEGATIVE
NITRITE: NEGATIVE
Protein, ur: NEGATIVE mg/dL
Specific Gravity, Urine: 1.025 (ref 1.005–1.030)
UROBILINOGEN UA: 0.2 mg/dL (ref 0.0–1.0)
pH: 5 (ref 5.0–8.0)

## 2014-05-14 MED ORDER — CLOTRIMAZOLE 10 MG MT TROC: 10.0000 mg | Freq: Every day | OROMUCOSAL | Status: DC

## 2014-05-14 MED ORDER — FLUCONAZOLE 150 MG PO TABS: 150.0000 mg | ORAL_TABLET | Freq: Once | ORAL | Status: DC

## 2014-05-14 MED ORDER — SULFAMETHOXAZOLE-TRIMETHOPRIM 800-160 MG PO TABS: 1.0000 | ORAL_TABLET | Freq: Two times a day (BID) | ORAL | Status: AC

## 2014-05-14 MED ORDER — NYSTATIN 100000 UNIT/ML MT SUSP: 500000.0000 [IU] | Freq: Four times a day (QID) | OROMUCOSAL | Status: DC

## 2014-05-14 MED ORDER — LIDOCAINE VISCOUS 2 % MT SOLN: 5.0000 mL | OROMUCOSAL | Status: DC | PRN

## 2014-05-14 MED ORDER — TRIAMCINOLONE ACETONIDE 0.1 % EX CREA: 1.0000 "application " | TOPICAL_CREAM | Freq: Two times a day (BID) | CUTANEOUS | Status: DC

## 2014-05-14 NOTE — ED Notes (Signed)
Pt would not go into detail with why she is here.

## 2014-05-14 NOTE — Discharge Instructions (Signed)
Scalp cyst - take Bactrim 1 pill twice a day for 7 days. - apply warm compresses.  Tongue - Nystatin swish and swallow for 7 days. - clotrimazole lozenge for 7 days. - use lidocaine every 4 hours as needed.  Vaginitis - use triamcinolone cream on the outside twice a day for the next week.  Follow up as needed.

## 2014-05-14 NOTE — ED Provider Notes (Signed)
CSN: [id number]     Arrival date & time 05/14/14  1404 History   First MD Initiated Contact with Patient 05/14/14 1413     Chief Complaint  Patient presents with  . Vaginitis  . Mass    bump on dead and tongue   (Consider location/radiation/quality/duration/timing/severity/associated sxs/prior Treatment) HPI She is a 53 year old woman here with multiple concerns.  She reports a bump on the back of her head. This started about a week ago. It has gradually gotten larger. It is tender. She has not seen any drainage. No fevers or chills.  She also reports swelling and redness of her tongue. This also started about a week ago. The pain is exacerbated by food. She is able to swallow without difficulty. She reports a history of difficult to treat thrush.  She also has vaginitis.  She states for the last week she has had some itching. This has gradually been getting worse. She has taken Diflucan and used Monistat without improvement. She denies any vaginal discharge.  Past Medical History  Diagnosis Date  . Personal history of unspecified circulatory disease   . Chest pain, unspecified   . Unspecified essential hypertension   . Other and unspecified hyperlipidemia     mixed  . Headache(784.0)   . Cough   . Fibromyalgia   . Depression   . Anxiety    Past Surgical History  Procedure Laterality Date  . Hematoma evacuation      vaginal hematoma 05/23/02  . Anterior perineoplasty      posterior repair. 05/16/02  . Bladder suspension    . Cardiac catheterization  2006    normal coronaries  . Esophagogastroduodenoscopy N/A 02/09/2013    Procedure: ESOPHAGOGASTRODUODENOSCOPY (EGD);  Surgeon: Milus Banister, MD;  Location: [facility];  Service: Endoscopy;  Laterality: N/A;   Family History  Problem Relation Age of Onset  . Allergies Mother     also children  . Asthma Daughter   . Heart disease Father   . Breast cancer Maternal Grandmother   . Colon cancer Brother   . Asthma  Grandchild    History  Substance Use Topics  . Smoking status: Never Smoker   . Smokeless tobacco: Never Used  . Alcohol Use: No   OB History    No data available     Review of Systems As in history of present illness Allergies  Savella; Bee venom; Codeine; and Ivp dye  Home Medications   Prior to Admission medications   Medication Sig Start Date End Date Taking? Authorizing Provider  ALPRAZolam Duanne Moron) 1 MG tablet Take 2 mg by mouth daily as needed for anxiety. 02/12/13  Yes Kinnie Feil, MD  amLODipine (NORVASC) 2.5 MG tablet TAKE 1 TABLET (2.5 MG TOTAL) BY MOUTH DAILY. 05/13/14  Yes Denita Lung, MD  bethanechol (URECHOLINE) 10 MG tablet Take 10 mg by mouth 3 (three) times daily as needed (bladder spasms).   Yes Historical Provider, MD  CRESTOR 5 MG tablet TAKE 1 TABLET (5 MG TOTAL) BY MOUTH AT BEDTIME. 05/13/14  Yes Denita Lung, MD  diazepam (VALIUM) 5 MG tablet Take 5 mg by mouth every 6 (six) hours as needed for anxiety (1-2 tabs at a time).   Yes Historical Provider, MD  escitalopram (LEXAPRO) 20 MG tablet Take 20 mg by mouth daily.   Yes Historical Provider, MD  gabapentin (NEURONTIN) 100 MG capsule Take 100 mg by mouth 3 (three) times daily.   Yes Historical Provider, MD  Linaclotide (LINZESS) 290 MCG CAPS capsule Take 1 capsule (290 mcg total) by mouth daily. 03/10/14  Yes Milus Banister, MD  metaxalone (SKELAXIN) 800 MG tablet Take 800 mg by mouth 2 (two) times daily as needed (muscle spasms).    Yes Historical Provider, MD  valACYclovir (VALTREX) 500 MG tablet Take 500 mg by mouth daily as needed.    Yes Historical Provider, MD  zolpidem (AMBIEN) 10 MG tablet Take 1 tablet (10 mg total) by mouth every other day. Alternates with temazepam 02/12/13  Yes Kinnie Feil, MD  clotrimazole (MYCELEX) 10 MG troche Take 1 tablet (10 mg total) by mouth 5 (five) times daily. 05/14/14   Melony Overly, MD  docusate sodium (COLACE) 100 MG capsule Take 1 capsule (100 mg total)  by mouth 2 (two) times daily. 02/12/13   Kinnie Feil, MD  feeding supplement (ENSURE COMPLETE) LIQD Take 237 mLs by mouth 3 (three) times daily between meals. 02/12/13   Kinnie Feil, MD  fluconazole (DIFLUCAN) 150 MG tablet Take 1 tablet (150 mg total) by mouth once. Repeat dose in 3 days if symptoms persist. 05/14/14   Melony Overly, MD  HYDROcodone-acetaminophen (NORCO/VICODIN) 5-325 MG per tablet Take 1 tablet by mouth every 6 (six) hours as needed for pain. 02/12/13   Kinnie Feil, MD  HYDROmorphone (DILAUDID) 2 MG tablet Take 1 tablet (2 mg total) by mouth every 4 (four) hours as needed for pain. 02/12/13   Kinnie Feil, MD  lidocaine (LIDODERM) 5 % Place 1 patch onto the skin daily. Remove & Discard patch within 12 hours or as directed by MD//3 patches at a time, 12 hours on and 12 hours off     Historical Provider, MD  lidocaine (XYLOCAINE) 2 % solution Use as directed 5 mLs in the mouth or throat every 4 (four) hours as needed for mouth pain. 05/14/14   Melony Overly, MD  metroNIDAZOLE (FLAGYL) 500 MG tablet Take 1 tablet (500 mg total) by mouth 2 (two) times daily. 10/25/13   Gregor Hams, MD  morphine (MS CONTIN) 15 MG 12 hr tablet Take 1 tablet (15 mg total) by mouth 2 (two) times daily as needed for pain. 02/12/13   Kinnie Feil, MD  nitrofurantoin, macrocrystal-monohydrate, (MACROBID) 100 MG capsule Take 1 capsule (100 mg total) by mouth 2 (two) times daily. 02/16/13   Debby Freiberg, MD  nitroGLYCERIN (NITROSTAT) 0.4 MG SL tablet Place 1 tablet (0.4 mg total) under the tongue every 5 (five) minutes as needed. if pain persist call 911 07/14/13   Dorothy Spark, MD  nystatin (MYCOSTATIN) 100000 UNIT/ML suspension Take 5 mLs (500,000 Units total) by mouth 4 (four) times daily. Swish and spit 05/14/14   Melony Overly, MD  olopatadine (PATANOL) 0.1 % ophthalmic solution Place 1 drop into both eyes 2 (two) times daily as needed for allergies.     Historical Provider, MD  OXYCODONE  HCL ER PO Take by mouth.    Historical Provider, MD  pantoprazole (PROTONIX) 40 MG tablet Take 1 tablet (40 mg total) by mouth daily at 6 (six) AM. 02/12/13   Kinnie Feil, MD  phenazopyridine (PYRIDIUM) 200 MG tablet Take 1 tablet (200 mg total) by mouth 3 (three) times daily. 02/16/13   Debby Freiberg, MD  polyethylene glycol [facility] Floria Raveling) packet Take 17 g by mouth 2 (two) times daily. 02/12/13   Kinnie Feil, MD  senna-docusate (SENOKOT-S) 8.6-50 MG per tablet Take  1 tablet by mouth 2 (two) times daily. 02/12/13   Kinnie Feil, MD  sulfamethoxazole-trimethoprim (BACTRIM DS,SEPTRA DS) 800-160 MG per tablet Take 1 tablet by mouth 2 (two) times daily. 05/14/14 05/21/14  Melony Overly, MD  triamcinolone cream (KENALOG) 0.1 % Apply 1 application topically 2 (two) times daily. 05/14/14   Melony Overly, MD   BP 125/74 mmHg  Pulse 60  Temp(Src) 98.3 F (36.8 C) (Oral)  Resp 16  SpO2 100% Physical Exam  Constitutional: She is oriented to person, place, and time. She appears well-developed and well-nourished. No distress.  HENT:  2cm tender nodule at occiput; no fluctuance or erythema.  Tongue is erythematous and mildly swollen.  White plaques and small erythematous papules on posterior tongue.  No other oral lesions.  Cardiovascular: Normal rate.   Pulmonary/Chest: Effort normal.  Genitourinary:    There is no rash on the right labia. There is no rash on the left labia. No vaginal discharge found.  Neurological: She is alert and oriented to person, place, and time.    ED Course  Procedures (including critical care time) Labs Review Labs Reviewed - No data to display  Imaging Review No results found.   MDM   1. Scalp cyst   2. Glossitis   3. Vaginitis    We'll treat scalp cyst with Bactrim for 7 days. Prescription for Diflucan to use after completing antibiotics provided. Nystatin, clotrimazole, viscous lidocaine for glossitis. Swabs for wet prep and GC chlamydia  sent. We'll treat with triamcinolone cream given the significant irritation and inflammation of labia. Follow-up as needed.    Melony Overly, MD 05/14/14 [phone]

## 2014-05-18 ENCOUNTER — Telehealth: Payer: Self-pay | Admitting: Family Medicine

## 2014-05-18 LAB — CERVICOVAGINAL ANCILLARY ONLY
Chlamydia: NEGATIVE
NEISSERIA GONORRHEA: NEGATIVE
Wet Prep (BD Affirm): NEGATIVE
Wet Prep (BD Affirm): NEGATIVE
Wet Prep (BD Affirm): NEGATIVE

## 2014-05-18 NOTE — Telephone Encounter (Signed)
ER  Letter sent

## 2014-06-18 ENCOUNTER — Ambulatory Visit (INDEPENDENT_AMBULATORY_CARE_PROVIDER_SITE_OTHER): Payer: Medicare Other | Admitting: Family Medicine

## 2014-06-18 ENCOUNTER — Encounter: Payer: Self-pay | Admitting: Family Medicine

## 2014-06-18 VITALS — BP 120/74 | HR 68 | Ht 66.5 in | Wt 156.0 lb

## 2014-06-18 DIAGNOSIS — Z8679 Personal history of other diseases of the circulatory system: Secondary | ICD-10-CM | POA: Diagnosis not present

## 2014-06-18 DIAGNOSIS — I10 Essential (primary) hypertension: Secondary | ICD-10-CM

## 2014-06-18 DIAGNOSIS — F32A Depression, unspecified: Secondary | ICD-10-CM

## 2014-06-18 DIAGNOSIS — E785 Hyperlipidemia, unspecified: Secondary | ICD-10-CM

## 2014-06-18 DIAGNOSIS — Z23 Encounter for immunization: Secondary | ICD-10-CM | POA: Diagnosis not present

## 2014-06-18 DIAGNOSIS — K5900 Constipation, unspecified: Secondary | ICD-10-CM

## 2014-06-18 DIAGNOSIS — Z79899 Other long term (current) drug therapy: Secondary | ICD-10-CM | POA: Diagnosis not present

## 2014-06-18 DIAGNOSIS — M797 Fibromyalgia: Secondary | ICD-10-CM

## 2014-06-18 DIAGNOSIS — F329 Major depressive disorder, single episode, unspecified: Secondary | ICD-10-CM

## 2014-06-18 DIAGNOSIS — R1013 Epigastric pain: Secondary | ICD-10-CM | POA: Diagnosis not present

## 2014-06-18 DIAGNOSIS — R079 Chest pain, unspecified: Secondary | ICD-10-CM

## 2014-06-18 DIAGNOSIS — R059 Cough, unspecified: Secondary | ICD-10-CM

## 2014-06-18 DIAGNOSIS — R05 Cough: Secondary | ICD-10-CM

## 2014-06-18 DIAGNOSIS — R51 Headache: Secondary | ICD-10-CM | POA: Diagnosis not present

## 2014-06-18 DIAGNOSIS — R519 Headache, unspecified: Secondary | ICD-10-CM

## 2014-06-18 LAB — LIPID PANEL
CHOL/HDL RATIO: 2.8 ratio
Cholesterol: 196 mg/dL (ref 0–200)
HDL: 69 mg/dL (ref >39)
LDL Cholesterol: 112 mg/dL — ABNORMAL HIGH (ref 0–99)
Triglycerides: 73 mg/dL (ref <150)
VLDL: 15 mg/dL (ref 0–40)

## 2014-06-18 LAB — CBC WITH DIFFERENTIAL/PLATELET
BASOS ABS: 0 10*3/uL (ref 0.0–0.1)
BASOS PCT: 0 % (ref 0–1)
Eosinophils Absolute: 0 10*3/uL (ref 0.0–0.7)
Eosinophils Relative: 1 % (ref 0–5)
HCT: 38.2 % (ref 36.0–46.0)
Hemoglobin: 12.5 g/dL (ref 12.0–15.0)
Lymphocytes Relative: 43 % (ref 12–46)
Lymphs Abs: 1.9 10*3/uL (ref 0.7–4.0)
MCH: 25.6 pg — AB (ref 26.0–34.0)
MCHC: 32.7 g/dL (ref 30.0–36.0)
MCV: 78.1 fL (ref 78.0–100.0)
MONOS PCT: 8 % (ref 3–12)
MPV: 8.9 fL (ref 8.6–12.4)
Monocytes Absolute: 0.4 10*3/uL (ref 0.1–1.0)
Neutro Abs: 2.2 10*3/uL (ref 1.7–7.7)
Neutrophils Relative %: 48 % (ref 43–77)
Platelets: 273 10*3/uL (ref 150–400)
RBC: 4.89 MIL/uL (ref 3.87–5.11)
RDW: 15.1 % (ref 11.5–15.5)
WBC: 4.5 10*3/uL (ref 4.0–10.5)

## 2014-06-18 LAB — COMPREHENSIVE METABOLIC PANEL
ALBUMIN: 4.3 g/dL (ref 3.5–5.2)
ALK PHOS: 104 U/L (ref 39–117)
ALT: 25 U/L (ref 0–35)
AST: 29 U/L (ref 0–37)
BUN: 14 mg/dL (ref 6–23)
CALCIUM: 9.7 mg/dL (ref 8.4–10.5)
CHLORIDE: 101 meq/L (ref 96–112)
CO2: 30 mEq/L (ref 19–32)
CREATININE: 0.9 mg/dL (ref 0.50–1.10)
Glucose, Bld: 84 mg/dL (ref 70–99)
Potassium: 4.6 mEq/L (ref 3.5–5.3)
Sodium: 139 mEq/L (ref 135–145)
Total Bilirubin: 0.6 mg/dL (ref 0.2–1.2)
Total Protein: 7.6 g/dL (ref 6.0–8.3)

## 2014-06-18 MED ORDER — AMLODIPINE BESYLATE 2.5 MG PO TABS: ORAL_TABLET | ORAL | Status: DC

## 2014-06-18 MED ORDER — NITROGLYCERIN 0.4 MG SL SUBL: 0.4000 mg | SUBLINGUAL_TABLET | SUBLINGUAL | Status: DC | PRN

## 2014-06-18 MED ORDER — ROSUVASTATIN CALCIUM 5 MG PO TABS: ORAL_TABLET | ORAL | Status: DC

## 2014-06-18 NOTE — Progress Notes (Signed)
   Subjective:    Patient ID: Gina Espinoza, female    DOB: Dec 09, 1960, 54 y.o.   MRN: [id number]  HPI She is here for an interval evaluation. She has a history of fibromyalgia and is being followed by Dr. Bennie Dallas. Apparently her lidocaine patches are no longer covered and she mentioned the use of fentanyl patches. He also complains of difficulty with sleep disturbance. She continues on medications listed in the chart. She does have difficulty with constipation and is using Linzess and MiraLAX. She also has difficulty with occasional abdominal pain. She continues to have chest pain and does use nitroglycerin roughly 8 or 9 times per year. Her the questioning indicates she is using the chest pain but it is unrelated to physical activity. She says she has chest pain and sometimes it goes into her neck, shoulders, arms and left lower extremity. She has never complained of PND, DOE. She continues to have headaches on a regular basis as well as a cough. Both of these are chronic conditions. She does see multiple physicians and in fact mentions that see has them on a rotational basis. She mentioned a hospitalization in November for treatment of GI related symptoms however the record does not show this. There was a diagnosis of malnutrition that I removed from the record since there was no documentation of it. Review his record indicates she has actually gained weight. Social history was reviewed. Review of Systems     Objective:   Physical Exam alert and in no distress. Tympanic membranes and canals are normal. Throat is clear. Tonsils are normal. Neck is supple without adenopathy or thyromegaly. Cardiac exam shows a regular sinus rhythm without murmurs or gallops. Lungs are clear to auscultation. Abdominal exam shows no masses or tenderness. No joint swelling noted. No edema noted. Pulses normal.       Assessment & Plan:  Fibromyalgia syndrome - Plan: CBC with Differential/Platelet, Comprehensive  metabolic panel, Lipid panel  Depression  History of cardiovascular disorder - Plan: CBC with Differential/Platelet, Comprehensive metabolic panel, Lipid panel  Hyperlipidemia LDL goal <100 - Plan: Lipid panel  Essential hypertension - Plan: CBC with Differential/Platelet, Comprehensive metabolic panel  Constipation, unspecified constipation type  Nonintractable headache, unspecified chronicity pattern, unspecified headache type  Cough  Chest pain, unspecified chest pain type  Epigastric pain  Encounter for long-term (current) use of medications - Plan: CBC with Differential/Platelet, Comprehensive metabolic panel, Lipid panel  Need for prophylactic vaccination and inoculation against influenza - Plan: Flu Vaccine QUAD 36+ mos IM  encouraged her to talk to Dr. Bennie Dallas concerning switching to a different patch. She is also to talk to Dr. Bennie Dallas concerning her difficulty with sleep

## 2014-07-01 DIAGNOSIS — M797 Fibromyalgia: Secondary | ICD-10-CM | POA: Diagnosis not present

## 2014-08-09 ENCOUNTER — Other Ambulatory Visit: Payer: Self-pay | Admitting: Family Medicine

## 2014-09-04 ENCOUNTER — Telehealth: Payer: Self-pay | Admitting: Family Medicine

## 2014-09-04 NOTE — Telephone Encounter (Signed)
Please call patient, she states she has not been notified of her lab results from 05/2014 visit

## 2014-09-06 NOTE — Telephone Encounter (Signed)
Let her know that the labs look good and apologize for not calling her sooner

## 2014-09-07 NOTE — Telephone Encounter (Signed)
Patient informed and verbalized understanding

## 2014-09-24 IMAGING — CR DG CHEST 2V
2 series · 2 of 2 positions shown · non-contrast
Comparison: None.

CLINICAL DATA: Cough for 1 week

CHEST - 2 VIEW

[w chest pa]
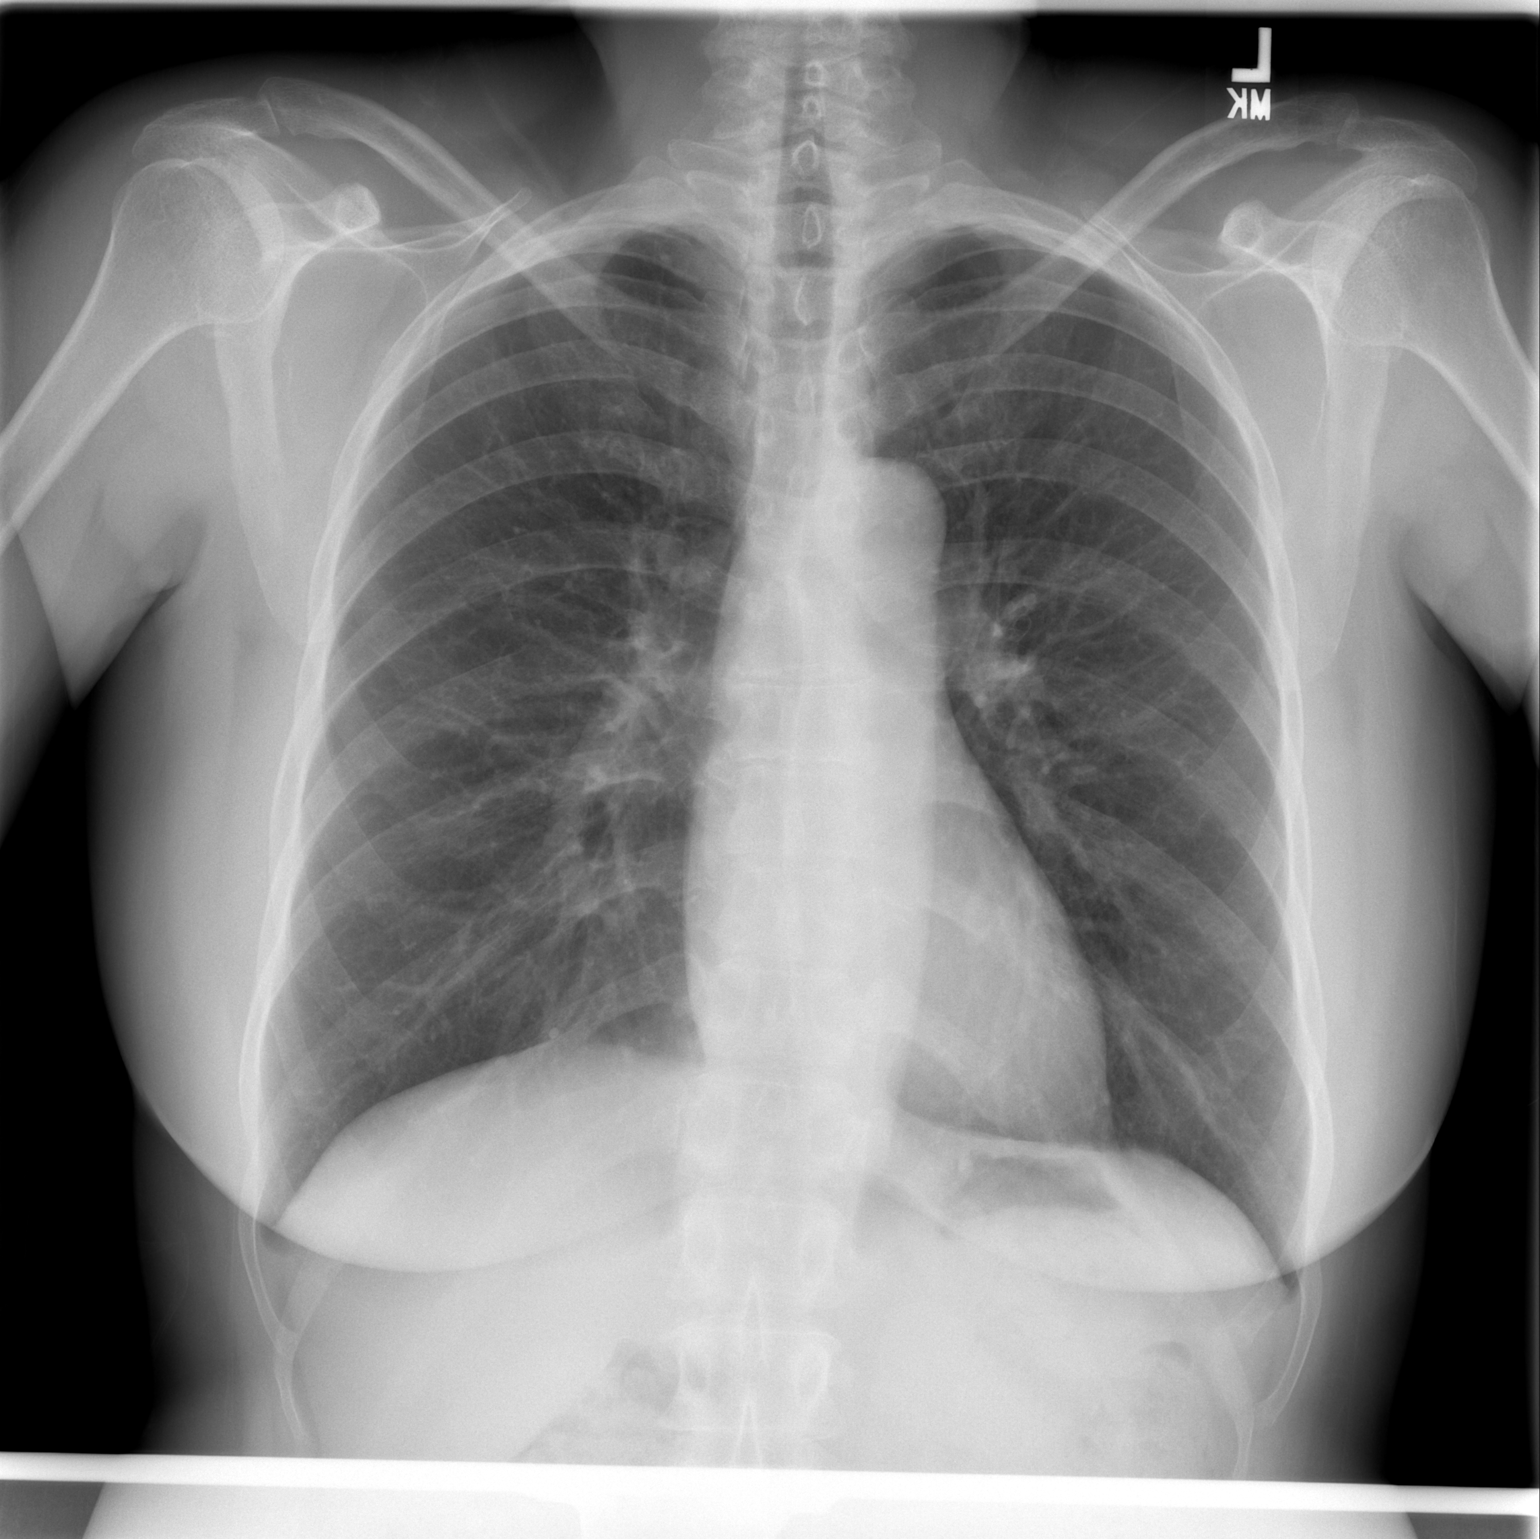

[w chest lat]
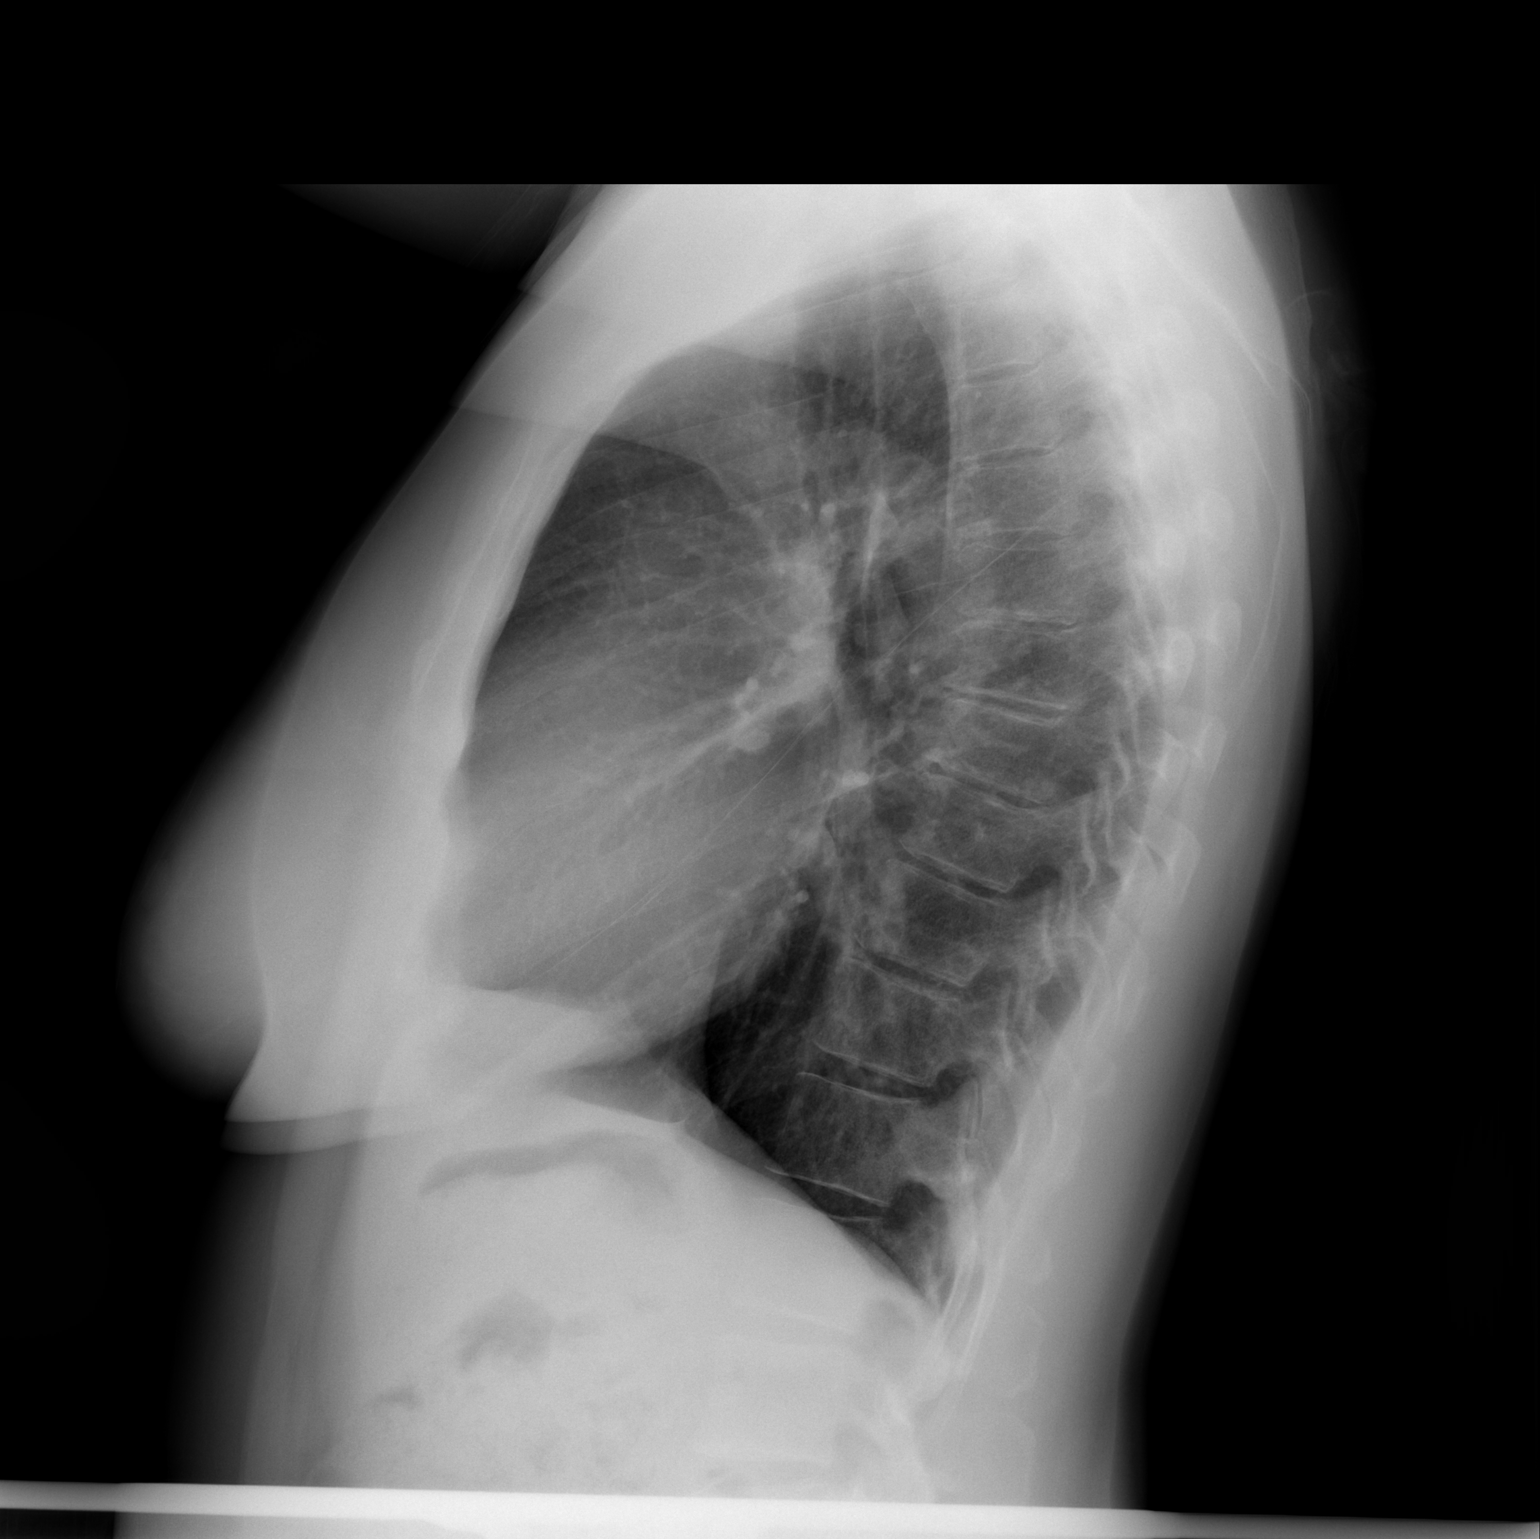

[2 of 2 positions shown; findings below may reference images not displayed]

FINDINGS: The heart, mediastinum and hila are within normal limits.
The lungs are clear.  The bony thorax is intact.
IMPRESSION: No active disease of the chest.

## 2014-10-08 DIAGNOSIS — M797 Fibromyalgia: Secondary | ICD-10-CM | POA: Diagnosis not present

## 2014-10-19 ENCOUNTER — Emergency Department (INDEPENDENT_AMBULATORY_CARE_PROVIDER_SITE_OTHER)
Admission: EM | Admit: 2014-10-19 | Discharge: 2014-10-19 | Disposition: A | Discharge status: Home / Self Care | Payer: Medicare Other | Source: Home / Self Care | Attending: Family Medicine | Admitting: Family Medicine

## 2014-10-19 ENCOUNTER — Encounter (HOSPITAL_COMMUNITY): Payer: Self-pay | Admitting: Emergency Medicine

## 2014-10-19 DIAGNOSIS — L723 Sebaceous cyst: Secondary | ICD-10-CM | POA: Diagnosis not present

## 2014-10-19 NOTE — ED Notes (Signed)
C/o cyst on the back on head States she has shooting pain coming from the cyst States she is not able to lay on head States she was seen for the same cyst in dec. 2015 and was given antibiotics

## 2014-10-19 NOTE — Discharge Instructions (Signed)
Thank you for coming in today. Follow up with the general surgeons.    Epidermal Cyst An epidermal cyst is sometimes called a sebaceous cyst, epidermal inclusion cyst, or infundibular cyst. These cysts usually contain a substance that looks "pasty" or "cheesy" and may have a bad smell. This substance is a protein called keratin. Epidermal cysts are usually found on the face, neck, or trunk. They may also occur in the vaginal area or other parts of the genitalia of both men and women. Epidermal cysts are usually small, painless, slow-growing bumps or lumps that move freely under the skin. It is important not to try to pop them. This may cause an infection and lead to tenderness and swelling. CAUSES  Epidermal cysts may be caused by a deep penetrating injury to the skin or a plugged hair follicle, often associated with acne. SYMPTOMS  Epidermal cysts can become inflamed and cause:  Redness.  Tenderness.  Increased temperature of the skin over the bumps or lumps.  Grayish-white, bad smelling material that drains from the bump or lump. DIAGNOSIS  Epidermal cysts are easily diagnosed by your caregiver during an exam. Rarely, a tissue sample (biopsy) may be taken to rule out other conditions that may resemble epidermal cysts. TREATMENT   Epidermal cysts often get better and disappear on their own. They are rarely ever cancerous.  If a cyst becomes infected, it may become inflamed and tender. This may require opening and draining the cyst. Treatment with antibiotics may be necessary. When the infection is gone, the cyst may be removed with minor surgery.  Small, inflamed cysts can often be treated with antibiotics or by injecting steroid medicines.  Sometimes, epidermal cysts become large and bothersome. If this happens, surgical removal in your caregiver's office may be necessary. HOME CARE INSTRUCTIONS  Only take over-the-counter or prescription medicines as directed by your  caregiver.  Take your antibiotics as directed. Finish them even if you start to feel better. SEEK MEDICAL CARE IF:   Your cyst becomes tender, red, or swollen.  Your condition is not improving or is getting worse.  You have any other questions or concerns. MAKE SURE YOU:  Understand these instructions.  Will watch your condition.  Will get help right away if you are not doing well or get worse. Document Released: 04/08/2004 Document Revised: 07/31/2011 Document Reviewed: 11/14/2010 [facility] Patient Information 2015 [place], [place]. This information is not intended to replace advice given to you by your health care provider. Make sure you discuss any questions you have with your health care provider.

## 2014-10-19 NOTE — ED Provider Notes (Signed)
Gina Espinoza is a 54 y.o. female who presents to Urgent Care today for cyst. Patient has a persistent cyst on the posterior left scalp present for about 6 months. It seems to get bigger and smaller and more painful and less painful. He currently larger than usual and more painful than usual. She denies any redness or drainage. She feels well otherwise. She was seen in urgent care 05/14/2014 for the same issue was treated with Bactrim which seemed to help a lot. She feels well otherwise.   Past Medical History  Diagnosis Date  . Personal history of unspecified circulatory disease   . Chest pain, unspecified   . Unspecified essential hypertension   . Other and unspecified hyperlipidemia     mixed  . Headache(784.0)   . Cough   . Fibromyalgia   . Depression   . Anxiety    Past Surgical History  Procedure Laterality Date  . Hematoma evacuation      vaginal hematoma 05/23/02  . Anterior perineoplasty      posterior repair. 05/16/02  . Bladder suspension    . Cardiac catheterization  2006    normal coronaries  . Esophagogastroduodenoscopy N/A 02/09/2013    Procedure: ESOPHAGOGASTRODUODENOSCOPY (EGD);  Surgeon: Milus Banister, MD;  Location: [facility];  Service: Endoscopy;  Laterality: N/A;   History  Substance Use Topics  . Smoking status: Never Smoker   . Smokeless tobacco: Never Used  . Alcohol Use: No   ROS as above Medications: No current facility-administered medications for this encounter.   Current Outpatient Prescriptions  Medication Sig Dispense Refill  . ALPRAZolam (XANAX) 1 MG tablet Take 2 mg by mouth daily as needed for anxiety.    Marland Kitchen amLODipine (NORVASC) 2.5 MG tablet TAKE 1 TABLET (2.5 MG TOTAL) BY MOUTH DAILY. 90 tablet 3  . bethanechol (URECHOLINE) 10 MG tablet Take 10 mg by mouth 3 (three) times daily as needed (bladder spasms).    . clotrimazole (MYCELEX) 10 MG troche Take 1 tablet (10 mg total) by mouth 5 (five) times daily. 35 tablet 0  . CRESTOR 5 MG  tablet TAKE 1 TABLET (5 MG TOTAL) BY MOUTH AT BEDTIME. 90 tablet 1  . diazepam (VALIUM) 5 MG tablet Take 5 mg by mouth every 6 (six) hours as needed for anxiety (1-2 tabs at a time).    Marland Kitchen docusate sodium (COLACE) 100 MG capsule Take 1 capsule (100 mg total) by mouth 2 (two) times daily. 10 capsule 0  . escitalopram (LEXAPRO) 20 MG tablet Take 20 mg by mouth daily.    . feeding supplement (ENSURE COMPLETE) LIQD Take 237 mLs by mouth 3 (three) times daily between meals.    . fluconazole (DIFLUCAN) 150 MG tablet Take 1 tablet (150 mg total) by mouth once. Repeat dose in 3 days if symptoms persist. 2 tablet 1  . gabapentin (NEURONTIN) 100 MG capsule Take 100 mg by mouth 3 (three) times daily.    Marland Kitchen HYDROcodone-acetaminophen (NORCO/VICODIN) 5-325 MG per tablet Take 1 tablet by mouth every 6 (six) hours as needed for pain. 10 tablet 0  . HYDROmorphone (DILAUDID) 2 MG tablet Take 1 tablet (2 mg total) by mouth every 4 (four) hours as needed for pain. 10 tablet 0  . lidocaine (LIDODERM) 5 % Place 1 patch onto the skin daily. Remove & Discard patch within 12 hours or as directed by MD//3 patches at a time, 12 hours on and 12 hours off     . lidocaine (XYLOCAINE) 2 %  solution Use as directed 5 mLs in the mouth or throat every 4 (four) hours as needed for mouth pain. 100 mL 0  . Linaclotide (LINZESS) 290 MCG CAPS capsule Take 1 capsule (290 mcg total) by mouth daily. 30 capsule 6  . metaxalone (SKELAXIN) 800 MG tablet Take 800 mg by mouth 2 (two) times daily as needed (muscle spasms).     . metroNIDAZOLE (FLAGYL) 500 MG tablet Take 1 tablet (500 mg total) by mouth 2 (two) times daily. 14 tablet 0  . morphine (MS CONTIN) 15 MG 12 hr tablet Take 1 tablet (15 mg total) by mouth 2 (two) times daily as needed for pain. 10 tablet 0  . nitrofurantoin, macrocrystal-monohydrate, (MACROBID) 100 MG capsule Take 1 capsule (100 mg total) by mouth 2 (two) times daily. (Patient not taking: Reported on 06/18/2014) 10 capsule 0   . nitroGLYCERIN (NITROSTAT) 0.4 MG SL tablet Place 1 tablet (0.4 mg total) under the tongue every 5 (five) minutes as needed. if pain persist call 911 25 tablet 3  . nystatin (MYCOSTATIN) 100000 UNIT/ML suspension Take 5 mLs (500,000 Units total) by mouth 4 (four) times daily. Swish and spit 60 mL 0  . olopatadine (PATANOL) 0.1 % ophthalmic solution Place 1 drop into both eyes 2 (two) times daily as needed for allergies.     . OXYCODONE HCL ER PO Take by mouth.    . pantoprazole (PROTONIX) 40 MG tablet Take 1 tablet (40 mg total) by mouth daily at 6 (six) AM.    . phenazopyridine (PYRIDIUM) 200 MG tablet Take 1 tablet (200 mg total) by mouth 3 (three) times daily. 15 tablet 0  . polyethylene glycol (MIRALAX / GLYCOLAX) packet Take 17 g by mouth 2 (two) times daily. 14 each 0  . rosuvastatin (CRESTOR) 5 MG tablet TAKE 1 TABLET (5 MG TOTAL) BY MOUTH AT BEDTIME. 90 tablet 3  . senna-docusate (SENOKOT-S) 8.6-50 MG per tablet Take 1 tablet by mouth 2 (two) times daily.    Marland Kitchen triamcinolone cream (KENALOG) 0.1 % Apply 1 application topically 2 (two) times daily. 30 g 0  . valACYclovir (VALTREX) 500 MG tablet Take 500 mg by mouth daily as needed.     . zolpidem (AMBIEN) 10 MG tablet Take 1 tablet (10 mg total) by mouth every other day. Alternates with temazepam 10 tablet 0   Allergies  Allergen Reactions  . Savella [Milnacipran Hcl] Anaphylaxis  . Bee Venom Swelling  . Codeine Hives and Itching  . Ivp Dye [Iodinated Diagnostic Agents] Hives and Itching     Exam:  BP 149/77 mmHg  Pulse 61  Temp(Src) 98.3 F (36.8 C) (Oral)  Resp 16  SpO2 100% Gen: Well NAD HEENT: EOMI,  MMM Scalp: Focal area of alopecia at the area of a small soft cystic structure. Nontender no erythema or induration. No discharge Exts: Brisk capillary refill, warm and well perfused.   No results found for this or any previous visit (from the past 24 hour(s)). No results found.  Assessment and Plan: 54 y.o. female with  scalp cyst likely epidermoid cyst versus lymph node. Not infected. Refer to general surgery for definitive excision.  Discussed warning signs or symptoms. Please see discharge instructions. Patient expresses understanding.     Gregor Hams, MD 10/19/14 [phone]

## 2014-11-03 ENCOUNTER — Ambulatory Visit: Payer: Self-pay | Admitting: Surgery

## 2014-11-03 DIAGNOSIS — L723 Sebaceous cyst: Secondary | ICD-10-CM | POA: Diagnosis not present

## 2014-11-03 NOTE — H&P (Signed)
History of Present Illness Gina Espinoza. Gina Elias MD; 11/03/2014 2:54 PM) Patient words: NP, CYST ON NECK.  The patient is a 54 year old female who presents with a complaint of Mass. PCP - [employer] This is a 54 year old female with a history of fibromyalgia, angina, hypertension, chronic headaches, who presents with a one-year history of a slowly enlarging mass on her posterior scalp at the upper part of her neck. This has caused intermittent shooting pains in headaches. She has never noticed any infection or drainage from this area. The tenderness seems to be worsening so she presents now for surgical evaluation. She was evaluated once at urgent care and they diagnosed her with a sebaceous cyst. Other Problems Gina Espinoza, RMA; 11/03/2014 2:32 PM) Anxiety Disorder Arthritis Back Pain Bladder Problems Chest pain Depression Hypercholesterolemia Kidney Stone Migraine Headache  Past Surgical History Gina Espinoza, RMA; 11/03/2014 2:32 PM) Colon Polyp Removal - Colonoscopy Colon Polyp Removal - Open Foot Surgery Bilateral.  Diagnostic Studies History Gina Espinoza, RMA; 11/03/2014 2:32 PM) Colonoscopy within last year Mammogram within last year  Allergies Gina Espinoza, RMA; 11/03/2014 2:36 PM) Gina Espinoza *PSYCHOTHERAPEUTIC AND NEUROLOGICAL AGENTS - MISC.* Codeine Phosphate *ANALGESICS - OPIOID* Dyes Insect Stings BEE STING  Medication History Gina Espinoza, RMA; 11/03/2014 2:43 PM) Promethazine HCl (25MG  Tablet, Oral) Active. FentaNYL (50MCG/HR Patch 72HR, Transdermal As needed) Active. OxyCODONE HCl (10MG  Tablet, Oral As needed) Active. Hydrocodone-Acetaminophen (10-325MG  Tablet, Oral As needed) Active. ALPRAZolam (2MG  Tablet, 1MG  Oral As needed) Active. Zolpidem Tartrate (10MG  Tablet, Oral As needed) Active. Acyclovir (400MG  Tablet, Oral As needed) Active. AmLODIPine Besylate (2.5MG  Tablet, Oral Daily) Active. Clotrimazole (10MG  Troche, Mouth/Throat As needed)  Active. Crestor (5MG  Tablet, Oral Daily) Active. Linzess (290MCG Capsule, Oral Daily) Active. Nitrostat (0.4MG  Tab Sublingual, Sublingual As needed) Active. Triamcinolone Acetonide (0.1% Cream, External As needed) Active. Urecholine (10MG  Tablet, Oral As needed) Active. Mycelex (10MG  Troche, Mouth/Throat As needed) Active. Valium (5MG  Tablet, Oral As needed) Active. Colace (100MG  Capsule, Oral As needed) Active. Lexapro (20MG  Tablet, Oral Daily) Active. Dilaudid (2MG  Tablet, Oral As needed) Active. Skelid (200MG  Tablet, Oral) Active. Skelaxin (400MG  Tablet, Oral As needed) Active. Flagyl (500MG  Tablet, Oral As needed) Active. Macrobid (100MG  Capsule, Oral As needed) Active. Patanol (0.1% Solution, Ophthalmic As needed) Active. Medications Reconciled  Social History Gina Espinoza, RMA; 11/03/2014 2:32 PM) Caffeine use Carbonated beverages, Coffee, Tea. No alcohol use No drug use Tobacco use Never smoker.  Family History Gina Espinoza, [place]; 11/03/2014 2:32 PM) Alcohol Abuse Father. Arthritis Mother. Cancer Family Members In General. Colon Cancer Brother. Heart Disease Father. Heart disease in female family member before age 19 Hypertension Brother. Migraine Headache Mother. Seizure disorder Daughter. Thyroid problems Daughter.  Pregnancy / Birth History Gina Espinoza, [place]; 11/03/2014 2:32 PM) Age at menarche 38 years. Age of menopause <44 Contraceptive History Contraceptive implant, Depo-provera, Oral contraceptives. Gravida 3 Maternal age 68-20 Para 3     Review of Systems Gina Espinoza RMA; 11/03/2014 2:32 PM) General Present- Fatigue and Night Sweats. Not Present- Appetite Loss, Chills, Fever, Weight Gain and Weight Loss. Skin Not Present- Change in Wart/Mole, Dryness, Hives, Jaundice, New Lesions, Non-Healing Wounds, Rash and Ulcer. HEENT Present- Seasonal Allergies and Wears glasses/contact lenses. Not Present- Earache, Hearing Loss,  Hoarseness, Nose Bleed, Oral Ulcers, Ringing in the Ears, Sinus Pain, Sore Throat, Visual Disturbances and Yellow Eyes. Respiratory Not Present- Bloody sputum, Chronic Cough, Difficulty Breathing, Snoring and Wheezing. Breast Not Present- Breast Mass, Breast Pain, Nipple Discharge and Skin Changes. Cardiovascular Present- Chest Pain, Leg Cramps,  Rapid Heart Rate, Shortness of Breath and Swelling of Extremities. Not Present- Difficulty Breathing Lying Down and Palpitations. Gastrointestinal Present- Abdominal Pain, Bloating, Constipation and Hemorrhoids. Not Present- Bloody Stool, Change in Bowel Habits, Chronic diarrhea, Difficulty Swallowing, Excessive gas, Gets full quickly at meals, Indigestion, Nausea, Rectal Pain and Vomiting. Female Genitourinary Not Present- Frequency, Nocturia, Painful Urination, Pelvic Pain and Urgency. Musculoskeletal Present- Back Pain, Joint Pain, Joint Stiffness, Muscle Pain, Muscle Weakness and Swelling of Extremities. Neurological Present- Headaches, Trouble walking and Weakness. Not Present- Decreased Memory, Fainting, Numbness, Seizures, Tingling and Tremor. Psychiatric Present- Anxiety, Change in Sleep Pattern and Depression. Not Present- Bipolar, Fearful and Frequent crying. Endocrine Present- Hair Changes and Hot flashes. Not Present- Cold Intolerance, Excessive Hunger, Heat Intolerance and New Diabetes. Hematology Not Present- Easy Bruising, Excessive bleeding, Gland problems, HIV and Persistent Infections.  Vitals (Robin Espinoza RMA; 11/03/2014 2:44 PM) 11/03/2014 2:43 PM Weight: 158.2 lb Height: 66in Body Surface Area: 1.83 m Body Mass Index: 25.53 kg/m Temp.: 99.72F  Pulse: 52 (Regular)  BP: 142/84 (Sitting, Left Arm, Standard)     Physical Exam Gina Key K. Monzerrat Wellen MD; 11/03/2014 3:03 PM)  The physical exam findings are as follows: Note:WDWN in NAD Skin - posterior scalp - occipital region - 2 cm mass with central punctate opening; no  drainage or sign of infection. Alopecia over the mass.    Assessment & Plan Gina Espinoza. Gina Boomer MD; 11/03/2014 2:46 PM)  SEBACEOUS CYST (706.2  L72.3)  Current Plans Schedule for Surgery - Excision of sebaceous cyst - posterior scalp. The surgical procedure has been discussed with the patient. Potential risks, benefits, alternative treatments, and expected outcomes have been explained. All of the patient's questions at this time have been answered. The likelihood of reaching the patient's treatment goal is good. The patient understand the proposed surgical procedure and wishes to proceed.

## 2014-12-01 DIAGNOSIS — L723 Sebaceous cyst: Secondary | ICD-10-CM | POA: Diagnosis not present

## 2014-12-07 ENCOUNTER — Telehealth: Payer: Self-pay | Admitting: Internal Medicine

## 2014-12-07 ENCOUNTER — Ambulatory Visit (INDEPENDENT_AMBULATORY_CARE_PROVIDER_SITE_OTHER): Payer: Medicare Other | Admitting: Family Medicine

## 2014-12-07 ENCOUNTER — Encounter: Payer: Self-pay | Admitting: Family Medicine

## 2014-12-07 VITALS — BP 102/60 | HR 48 | Temp 97.0°F | Ht 66.0 in | Wt 158.2 lb

## 2014-12-07 DIAGNOSIS — Z91038 Other insect allergy status: Secondary | ICD-10-CM

## 2014-12-07 DIAGNOSIS — H1013 Acute atopic conjunctivitis, bilateral: Secondary | ICD-10-CM

## 2014-12-07 DIAGNOSIS — Z91030 Bee allergy status: Secondary | ICD-10-CM

## 2014-12-07 DIAGNOSIS — J309 Allergic rhinitis, unspecified: Secondary | ICD-10-CM

## 2014-12-07 MED ORDER — EPINEPHRINE 0.3 MG/0.3ML IJ SOAJ: 0.3000 mg | Freq: Once | INTRAMUSCULAR | Status: DC

## 2014-12-07 MED ORDER — DESLORATADINE 5 MG PO TABS: 5.0000 mg | ORAL_TABLET | Freq: Every day | ORAL | Status: DC

## 2014-12-07 MED ORDER — FLUTICASONE PROPIONATE 50 MCG/ACT NA SUSP: 2.0000 | Freq: Every day | NASAL | Status: DC

## 2014-12-07 MED ORDER — LEVOCETIRIZINE DIHYDROCHLORIDE 5 MG PO TABS: 5.0000 mg | ORAL_TABLET | Freq: Every evening | ORAL | Status: DC

## 2014-12-07 NOTE — Telephone Encounter (Signed)
Pt called and said that one of the meds you sent into pharmacy is not covered but she could not remember the name of it. She would like you to send in Lezocetirizine 5mg  into [facility] as this is covered

## 2014-12-07 NOTE — Progress Notes (Signed)
Chief Complaint  Patient presents with  . Eye Pain    Dr Gershon Crane did surgery on the back head for a sebaceous cyst removed last Tuesday. SInce then the left side of her face has been swollen. Saturday her eyes began to swell and itch, left worse than right.   . Medication Refill    requests Epi-pen today to pharmacy, as she is allergic to bee venom.    2 days ago she noticed that her left eye was swollen, couldn't open it wide.  It looked "lazy", swollen.  Both eyes are a little sowllen. Eyes itch, burn, hurt.  She feels like both of her eyes and the left side of her face feel swollen.  Denies any runny nose, sneezing.  Eyes are watering, itching, burning.  Denies eye crusting.  Vision is fine (other than blurry when they are watering, which is intermittent).   No new facial creams, makeup or other topical exposures to face/eyes (she had surgery 7/12--removal of sebaceous cyst from her scalp, changed to Dial antibacterial soap).   She had been vacuuming out leaves from the pool recently. There was some mold exposure (sides of the pool).  She has chronic allergies. Allergies are year-round. Takes diphenhydramine 25mg  every night. She previously used Flonase, but hasn't had it in a while. She also used to take Claritin, but when it went over the counter, it was too expensive, so changed to benadryl instead. She tried patanol eye drops (she had at home), didn't help. She has been using ice packs, which felt good.  Needs epipen for bee sting allergy (supposed to have gotten from ER)  PMH, PSH, SH reviewed. Outpatient Encounter Prescriptions as of 12/07/2014  Medication Sig  . ALPRAZolam (XANAX) 1 MG tablet Take 1 mg by mouth daily as needed for anxiety.   Marland Kitchen alprazolam (XANAX) 2 MG tablet TAKE 1 TABLET BY MOUTH EVERY 4 TO 6 HOURS AS NEEDED FOR ANXIETY  . amLODipine (NORVASC) 2.5 MG tablet TAKE 1 TABLET (2.5 MG TOTAL) BY MOUTH DAILY.  Marland Kitchen CRESTOR 5 MG tablet TAKE 1 TABLET (5 MG TOTAL) BY MOUTH AT  BEDTIME.  . diazepam (VALIUM) 5 MG tablet Take 5 mg by mouth every 6 (six) hours as needed for anxiety (1-2 tabs at a time).  Marland Kitchen docusate sodium (COLACE) 100 MG capsule Take 1 capsule (100 mg total) by mouth 2 (two) times daily.  Marland Kitchen escitalopram (LEXAPRO) 20 MG tablet Take 20 mg by mouth daily.  . fentaNYL (DURAGESIC - DOSED MCG/HR) 50 MCG/HR Place 50 mcg onto the skin every 3 (three) days.  Marland Kitchen HYDROcodone-acetaminophen (HYCET) 7.5-325 mg/15 ml solution TAKE 15 ML BY MOUTH EVERY 6 HOURS AS NEEDED FOR PAIN  . HYDROcodone-acetaminophen (NORCO/VICODIN) 5-325 MG per tablet Take 1 tablet by mouth every 6 (six) hours as needed for pain.  Marland Kitchen HYDROmorphone (DILAUDID) 2 MG tablet Take 1 tablet (2 mg total) by mouth every 4 (four) hours as needed for pain.  Marland Kitchen lidocaine (LIDODERM) 5 % Place 1 patch onto the skin daily. Remove & Discard patch within 12 hours or as directed by MD//3 patches at a time, 12 hours on and 12 hours off   . Linaclotide (LINZESS) 290 MCG CAPS capsule Take 1 capsule (290 mcg total) by mouth daily.  . metaxalone (SKELAXIN) 800 MG tablet Take 800 mg by mouth 2 (two) times daily as needed (muscle spasms).   . morphine (MS CONTIN) 15 MG 12 hr tablet Take 1 tablet (15 mg total) by mouth  2 (two) times daily as needed for pain.  Marland Kitchen olopatadine (PATANOL) 0.1 % ophthalmic solution Place 1 drop into both eyes 2 (two) times daily as needed for allergies.   . OXYCODONE HCL ER PO Take by mouth.  . pantoprazole (PROTONIX) 40 MG tablet Take 1 tablet (40 mg total) by mouth daily at 6 (six) AM.  . polyethylene glycol (MIRALAX / GLYCOLAX) packet Take 17 g by mouth 2 (two) times daily.  Marland Kitchen zolpidem (AMBIEN) 10 MG tablet Take 1 tablet (10 mg total) by mouth every other day. Alternates with temazepam  . bethanechol (URECHOLINE) 10 MG tablet Take 10 mg by mouth 3 (three) times daily as needed (bladder spasms).  . clotrimazole (MYCELEX) 10 MG troche Take 1 tablet (10 mg total) by mouth 5 (five) times daily.  (Patient not taking: Reported on 12/07/2014)  . fluconazole (DIFLUCAN) 150 MG tablet Take 1 tablet (150 mg total) by mouth once. Repeat dose in 3 days if symptoms persist. (Patient not taking: Reported on 12/07/2014)  . lidocaine (XYLOCAINE) 2 % solution Use as directed 5 mLs in the mouth or throat every 4 (four) hours as needed for mouth pain. (Patient not taking: Reported on 12/07/2014)  . metroNIDAZOLE (FLAGYL) 500 MG tablet Take 1 tablet (500 mg total) by mouth 2 (two) times daily. (Patient not taking: Reported on 12/07/2014)  . nitrofurantoin, macrocrystal-monohydrate, (MACROBID) 100 MG capsule Take 1 capsule (100 mg total) by mouth 2 (two) times daily. (Patient not taking: Reported on 06/18/2014)  . nitroGLYCERIN (NITROSTAT) 0.4 MG SL tablet Place 1 tablet (0.4 mg total) under the tongue every 5 (five) minutes as needed. if pain persist call 911 (Patient not taking: Reported on 12/07/2014)  . nystatin (MYCOSTATIN) 100000 UNIT/ML suspension Take 5 mLs (500,000 Units total) by mouth 4 (four) times daily. Swish and spit (Patient not taking: Reported on 12/07/2014)  . phenazopyridine (PYRIDIUM) 200 MG tablet Take 1 tablet (200 mg total) by mouth 3 (three) times daily. (Patient not taking: Reported on 12/07/2014)  . triamcinolone cream (KENALOG) 0.1 % Apply 1 application topically 2 (two) times daily. (Patient not taking: Reported on 12/07/2014)  . valACYclovir (VALTREX) 500 MG tablet Take 500 mg by mouth daily as needed.   . [DISCONTINUED] feeding supplement (ENSURE COMPLETE) LIQD Take 237 mLs by mouth 3 (three) times daily between meals.  . [DISCONTINUED] gabapentin (NEURONTIN) 100 MG capsule Take 100 mg by mouth 3 (three) times daily.  . [DISCONTINUED] rosuvastatin (CRESTOR) 5 MG tablet TAKE 1 TABLET (5 MG TOTAL) BY MOUTH AT BEDTIME.  . [DISCONTINUED] senna-docusate (SENOKOT-S) 8.6-50 MG per tablet Take 1 tablet by mouth 2 (two) times daily.   No facility-administered encounter medications on file as of  12/07/2014.   Diphenhydramine 25mg  every night.  Allergies  Allergen Reactions  . Savella [Milnacipran Hcl] Anaphylaxis  . Bee Venom Swelling  . Codeine Hives and Itching  . Ivp Dye [Iodinated Diagnostic Agents] Hives and Itching   ROS:  No fever, chills, cough, shortness of breath, headache, sore throat, ear pain, GI complaints, rash or other concerns.  PHYSICAL EXAM: BP 102/60 mmHg  Pulse 48  Temp(Src) 97 F (36.1 C) (Tympanic)  Ht 5\' 6"  (1.676 m)  Wt 158 lb 3.2 oz (71.759 kg)  BMI 25.55 kg/m2 Well developed female in no distress. She is in no distress, although upset about her appearance/swelling (which is NOT very noticeable at all). HEENT: PERRL, EOMI.  Very mild conjunctival injection of the nasal aspect of the left eye only.  No surrounding erythema. Very slight soft tissue swelling of the eyelids on the left, with small area of soft tissue swelling above the left cheekbone (below the eye).  No erythema, warmth. TM's and EAC's normal. Nasal mucosa is mild-moderately edematous. Sinuses nontender. OP clear Neck: No lymphadenopathy, thyromegaly or mass Heart: regular rate and rhythm Lungs: clear Skin: no rash Neuro: alert and oriented.  Cranial nerves intact. Normal sensation, normal gait  ASSESSMENT/PLAN:  Allergic conjunctivitis, bilateral  Allergic rhinitis, unspecified allergic rhinitis type - Plan: fluticasone (FLONASE) 50 MCG/ACT nasal spray, DISCONTINUED: desloratadine (CLARINEX) 5 MG tablet  Bee sting allergy - Plan: EPINEPHrine 0.3 mg/0.3 mL IJ SOAJ injection  Suspect flare of allergic conjunctivitis, likely related to recent mold exposure.   Maximize treatment of perennial allergies by restarting nasal steroid (which will help eyes some), and using a 24hr antihistamine. Clarinex originally rx'd, but not covered--later changed to Xyzal. Discussed the potential for oral steroids if worse allergic reaction, but despite her concerns, swelling was minimal.  Try  these measures first, and f/u if worsening allergic reaction.  Proper flonase use reviewed.  Risks/side effects of meds reviewed  Risks/side effects of epi-pen reviewed, when to use, and need to f/u in ER after use.  25 min visit, more than 1/2 counseling.  All questions answered.

## 2014-12-07 NOTE — Patient Instructions (Signed)
Continue to use the Patanol. Start the [facility] daily. Start the Clarinex once daily (in place of the current allergy medication you are taking). If this medication is not covered, consider switching to a once daily over-the-counter antihistamine such as loratidine, fexofenadine, cetirizine, versus, using the one that you have as often as stated on the bottle (likely every 6 hours).  Continue to use cool compresses.  Try and avoid further exposure to allergens (ie mold). Return if worsening symptoms, especially if fever, redness/warmth to the swelling around the eye and into the face.

## 2014-12-07 NOTE — Telephone Encounter (Signed)
done

## 2014-12-17 ENCOUNTER — Other Ambulatory Visit: Payer: Self-pay | Admitting: Obstetrics and Gynecology

## 2014-12-17 DIAGNOSIS — Z124 Encounter for screening for malignant neoplasm of cervix: Secondary | ICD-10-CM | POA: Diagnosis not present

## 2014-12-17 DIAGNOSIS — N958 Other specified menopausal and perimenopausal disorders: Secondary | ICD-10-CM | POA: Diagnosis not present

## 2014-12-17 DIAGNOSIS — Z6825 Body mass index (BMI) 25.0-25.9, adult: Secondary | ICD-10-CM | POA: Diagnosis not present

## 2014-12-17 DIAGNOSIS — Z1231 Encounter for screening mammogram for malignant neoplasm of breast: Secondary | ICD-10-CM | POA: Diagnosis not present

## 2014-12-17 DIAGNOSIS — Z779 Other contact with and (suspected) exposures hazardous to health: Secondary | ICD-10-CM | POA: Diagnosis not present

## 2014-12-17 DIAGNOSIS — M8588 Other specified disorders of bone density and structure, other site: Secondary | ICD-10-CM | POA: Diagnosis not present

## 2014-12-18 LAB — CYTOLOGY - PAP

## 2014-12-28 ENCOUNTER — Telehealth: Payer: Self-pay | Admitting: Gastroenterology

## 2014-12-28 MED ORDER — LINACLOTIDE 290 MCG PO CAPS: 290.0000 ug | ORAL_CAPSULE | Freq: Every day | ORAL | Status: DC

## 2014-12-28 NOTE — Telephone Encounter (Signed)
rx sent for 1 month, pt must have office visit for further refills

## 2015-03-17 ENCOUNTER — Emergency Department (INDEPENDENT_AMBULATORY_CARE_PROVIDER_SITE_OTHER): Payer: Medicare Other

## 2015-03-17 ENCOUNTER — Encounter (HOSPITAL_COMMUNITY): Payer: Self-pay | Admitting: Emergency Medicine

## 2015-03-17 ENCOUNTER — Emergency Department (INDEPENDENT_AMBULATORY_CARE_PROVIDER_SITE_OTHER)
Admission: EM | Admit: 2015-03-17 | Discharge: 2015-03-17 | Disposition: A | Discharge status: Home / Self Care | Payer: Medicare Other | Source: Home / Self Care | Attending: Emergency Medicine | Admitting: Emergency Medicine

## 2015-03-17 DIAGNOSIS — M25561 Pain in right knee: Secondary | ICD-10-CM

## 2015-03-17 DIAGNOSIS — W19XXXA Unspecified fall, initial encounter: Secondary | ICD-10-CM | POA: Diagnosis not present

## 2015-03-17 MED ORDER — PREDNISONE 50 MG PO TABS: ORAL_TABLET | ORAL | Status: DC

## 2015-03-17 NOTE — ED Provider Notes (Signed)
CSN: [id number]     Arrival date & time 03/17/15  1443 History   First MD Initiated Contact with Patient 03/17/15 1553     Chief Complaint  Patient presents with  . Fall   (Consider location/radiation/quality/duration/timing/severity/associated sxs/prior Treatment) HPI She is a 54 year old woman here for evaluation of right knee pain after a fall. She states yesterday afternoon she was running from a dog when she tripped over something in her garage. She states she caught both of her feet and fell onto her knees. She also reports hitting something on her right hip. She reports swelling in the left knee and right foot that has improved.  She states her ankles and left knee are sore, but she is able to move them without difficulty. Her right knee is more painful and she is unable to flex it beyond 90. She states that last night, she tried to go up a stair, leading with the right knee and felt excruciating pain in the anterior part of the knee. This caused her to fall second time. She reports a little bit of swelling in the right knee, and she is unable to flex it past 90. If she goes beyond 35, she feels like it locks up and she needs help extending it.  Past Medical History  Diagnosis Date  . Personal history of unspecified circulatory disease   . Chest pain, unspecified   . Unspecified essential hypertension   . Other and unspecified hyperlipidemia     mixed  . Headache(784.0)   . Cough   . Fibromyalgia   . Depression   . Anxiety    Past Surgical History  Procedure Laterality Date  . Hematoma evacuation      vaginal hematoma 05/23/02  . Anterior perineoplasty      posterior repair. 05/16/02  . Bladder suspension    . Cardiac catheterization  2006    normal coronaries  . Esophagogastroduodenoscopy N/A 02/09/2013    Procedure: ESOPHAGOGASTRODUODENOSCOPY (EGD);  Surgeon: Milus Banister, MD;  Location: [place];  Service: Endoscopy;  Laterality: N/A;  . Excision sebaceous cyst   11/2013    posterior scalp   Family History  Problem Relation Age of Onset  . Allergies Mother     also children  . Asthma Daughter   . Heart disease Father   . Breast cancer Maternal Grandmother   . Colon cancer Brother   . Asthma Grandchild    Social History  Substance Use Topics  . Smoking status: Never Smoker   . Smokeless tobacco: Never Used  . Alcohol Use: No   OB History    No data available     Review of Systems As in history of present illness Allergies  Savella; Bee venom; Codeine; and Ivp dye  Home Medications   Prior to Admission medications   Medication Sig Start Date End Date Taking? Authorizing Provider  ALPRAZolam Duanne Moron) 1 MG tablet Take 1 mg by mouth daily as needed for anxiety.  02/12/13   Kinnie Feil, MD  alprazolam (XANAX) 2 MG tablet TAKE 1 TABLET BY MOUTH EVERY 4 TO 6 HOURS AS NEEDED FOR ANXIETY 11/24/14   Historical Provider, MD  amLODipine (NORVASC) 2.5 MG tablet TAKE 1 TABLET (2.5 MG TOTAL) BY MOUTH DAILY. 06/18/14   Denita Lung, MD  bethanechol (URECHOLINE) 10 MG tablet Take 10 mg by mouth 3 (three) times daily as needed (bladder spasms).    Historical Provider, MD  CRESTOR 5 MG tablet TAKE 1 TABLET (5  MG TOTAL) BY MOUTH AT BEDTIME. 08/10/14   Denita Lung, MD  diazepam (VALIUM) 5 MG tablet Take 5 mg by mouth every 6 (six) hours as needed for anxiety (1-2 tabs at a time).    Historical Provider, MD  docusate sodium (COLACE) 100 MG capsule Take 1 capsule (100 mg total) by mouth 2 (two) times daily. 02/12/13   Kinnie Feil, MD  EPINEPHrine 0.3 mg/0.3 mL IJ SOAJ injection Inject 0.3 mLs (0.3 mg total) into the muscle once. 12/07/14   Rita Ohara, MD  escitalopram (LEXAPRO) 20 MG tablet Take 20 mg by mouth daily.    Historical Provider, MD  fentaNYL (DURAGESIC - DOSED MCG/HR) 50 MCG/HR Place 50 mcg onto the skin every 3 (three) days.    Historical Provider, MD  fluticasone (FLONASE) 50 MCG/ACT nasal spray Place 2 sprays into both nostrils daily.  12/07/14   Rita Ohara, MD  HYDROcodone-acetaminophen (HYCET) 7.5-325 mg/15 ml solution TAKE 15 ML BY MOUTH EVERY 6 HOURS AS NEEDED FOR PAIN 11/25/14   Historical Provider, MD  HYDROcodone-acetaminophen (NORCO/VICODIN) 5-325 MG per tablet Take 1 tablet by mouth every 6 (six) hours as needed for pain. 02/12/13   Kinnie Feil, MD  HYDROmorphone (DILAUDID) 2 MG tablet Take 1 tablet (2 mg total) by mouth every 4 (four) hours as needed for pain. 02/12/13   Kinnie Feil, MD  levocetirizine (XYZAL) 5 MG tablet Take 1 tablet (5 mg total) by mouth every evening. 12/07/14   Rita Ohara, MD  lidocaine (LIDODERM) 5 % Place 1 patch onto the skin daily. Remove & Discard patch within 12 hours or as directed by MD//3 patches at a time, 12 hours on and 12 hours off     Historical Provider, MD  Linaclotide (LINZESS) 290 MCG CAPS capsule Take 1 capsule (290 mcg total) by mouth daily. 12/28/14   Milus Banister, MD  metaxalone (SKELAXIN) 800 MG tablet Take 800 mg by mouth 2 (two) times daily as needed (muscle spasms).     Historical Provider, MD  morphine (MS CONTIN) 15 MG 12 hr tablet Take 1 tablet (15 mg total) by mouth 2 (two) times daily as needed for pain. 02/12/13   Kinnie Feil, MD  nitroGLYCERIN (NITROSTAT) 0.4 MG SL tablet Place 1 tablet (0.4 mg total) under the tongue every 5 (five) minutes as needed. if pain persist call 911 Patient not taking: Reported on 12/07/2014 06/18/14   Denita Lung, MD  olopatadine (PATANOL) 0.1 % ophthalmic solution Place 1 drop into both eyes 2 (two) times daily as needed for allergies.     Historical Provider, MD  OXYCODONE HCL ER PO Take by mouth.    Historical Provider, MD  pantoprazole (PROTONIX) 40 MG tablet Take 1 tablet (40 mg total) by mouth daily at 6 (six) AM. 02/12/13   Kinnie Feil, MD  polyethylene glycol (MIRALAX / GLYCOLAX) packet Take 17 g by mouth 2 (two) times daily. 02/12/13   Kinnie Feil, MD  predniSONE (DELTASONE) 50 MG tablet Take 1 pill daily for 5 days.  03/17/15   Melony Overly, MD  valACYclovir (VALTREX) 500 MG tablet Take 500 mg by mouth daily as needed.     Historical Provider, MD  zolpidem (AMBIEN) 10 MG tablet Take 1 tablet (10 mg total) by mouth every other day. Alternates with temazepam 02/12/13   Kinnie Feil, MD   Meds Ordered and Administered this Visit  Medications - No data to display  BP 133/83  mmHg  Pulse 61  Temp(Src) 98.2 F (36.8 C) (Oral)  Resp 16  SpO2 98% No data found.   Physical Exam  Constitutional: She is oriented to person, place, and time. She appears well-developed and well-nourished. No distress.  Cardiovascular: Normal rate.   Pulmonary/Chest: Effort normal.  Musculoskeletal:  Right knee: No bruising or erythema. There is a small amount of swelling around the inferior patella. No appreciable joint effusion. She is tender to palpation along the patellar tendon and inferior patella. No joint line tenderness. She is able to flex her knee to 90. She has pain when testing knee extension. Left knee: No bruising or erythema. She does have some soft tissue swelling at the lateral aspect. No appreciable joint effusion. She is tender at the insertion of the patellar tendon. Ankles: No bruising, erythema, or edema. Mild tenderness across the anterior ankles. 2+ DP pulses bilaterally.  Neurological: She is alert and oriented to person, place, and time.    ED Course  Procedures (including critical care time)  Labs Review Labs Reviewed - No data to display  Imaging Review Dg Knee Complete 4 Views Right  03/17/2015  CLINICAL DATA:  Tripped and fell yesterday with right knee pain, initial encounter EXAM: RIGHT KNEE - COMPLETE 4+ VIEW COMPARISON:  None. FINDINGS: There is no evidence of fracture, dislocation, or joint effusion. There is no evidence of arthropathy or other focal bone abnormality. Soft tissues are unremarkable. IMPRESSION: No acute abnormality noted. Electronically Signed   By: Inez Catalina M.D.    On: 03/17/2015 16:41      MDM   1. Right knee pain   2. Fall, initial encounter    I suspect that the fall jarred the loose body in her knee. We'll treat with 5 days of prednisone. Recommended frequent icing. Knee immobilizer and crutches given. Recommended wearing when moving around and at night for comfort. Discussed importance of gentle range of motion several times a day. If this is not improving in 1 week, follow-up with orthopedics for additional evaluation.   Melony Overly, MD 03/17/15 [phone]

## 2015-03-17 NOTE — Discharge Instructions (Signed)
X-ray is negative. You do have a loose body in your knee. I suspect that the fall jarred the loose body and that is why you're having pain and locking when you bend your knee. Take prednisone daily for the next 5 days to help bring down the inflammation and irritation. Apply ice as often as you can. Wear the knee immobilizer if you are going to be moving around. You can wear it at night if it is more comfortable for you. Make sure you are doing gentle range of motion with your knee at least 3 times a day. If things are not improving in 1 week, please call Dr. Percell Miller, and orthopedic specialist.

## 2015-03-17 NOTE — ED Notes (Signed)
Patient reports falling yesterday, early in the day.  Reports running from a dog, she was able to get into garage, but stumbled falling forward and to the left.  Both knees, right hip, and both feet sore.  No bruises visible.  Later last night, patient was walking up stairs and felt like right knee "went out".

## 2015-04-03 ENCOUNTER — Other Ambulatory Visit: Payer: Self-pay | Admitting: Family Medicine

## 2015-04-05 ENCOUNTER — Other Ambulatory Visit: Payer: Self-pay | Admitting: Cardiology

## 2015-04-05 ENCOUNTER — Other Ambulatory Visit: Payer: Self-pay | Admitting: Family Medicine

## 2015-04-06 ENCOUNTER — Other Ambulatory Visit: Payer: Self-pay | Admitting: Family Medicine

## 2015-04-07 NOTE — Telephone Encounter (Signed)
Is this okay to refill? 

## 2015-04-21 DIAGNOSIS — M542 Cervicalgia: Secondary | ICD-10-CM | POA: Diagnosis not present

## 2015-04-21 DIAGNOSIS — M79641 Pain in right hand: Secondary | ICD-10-CM | POA: Diagnosis not present

## 2015-04-21 DIAGNOSIS — M25561 Pain in right knee: Secondary | ICD-10-CM | POA: Diagnosis not present

## 2015-04-21 DIAGNOSIS — R200 Anesthesia of skin: Secondary | ICD-10-CM | POA: Diagnosis not present

## 2015-04-28 DIAGNOSIS — R3916 Straining to void: Secondary | ICD-10-CM | POA: Diagnosis not present

## 2015-05-05 ENCOUNTER — Other Ambulatory Visit: Payer: Self-pay | Admitting: Family Medicine

## 2015-05-27 ENCOUNTER — Encounter (HOSPITAL_COMMUNITY): Payer: Self-pay | Admitting: Emergency Medicine

## 2015-05-27 ENCOUNTER — Emergency Department (HOSPITAL_COMMUNITY)
Admission: EM | Admit: 2015-05-27 | Discharge: 2015-05-28 | Disposition: A | Discharge status: Home / Self Care | Payer: Medicare Other | Attending: Emergency Medicine | Admitting: Emergency Medicine

## 2015-05-27 DIAGNOSIS — R454 Irritability and anger: Secondary | ICD-10-CM | POA: Diagnosis not present

## 2015-05-27 DIAGNOSIS — Z79899 Other long term (current) drug therapy: Secondary | ICD-10-CM | POA: Diagnosis not present

## 2015-05-27 DIAGNOSIS — F419 Anxiety disorder, unspecified: Secondary | ICD-10-CM | POA: Insufficient documentation

## 2015-05-27 DIAGNOSIS — F99 Mental disorder, not otherwise specified: Secondary | ICD-10-CM | POA: Diagnosis not present

## 2015-05-27 DIAGNOSIS — R451 Restlessness and agitation: Secondary | ICD-10-CM | POA: Insufficient documentation

## 2015-05-27 DIAGNOSIS — F329 Major depressive disorder, single episode, unspecified: Secondary | ICD-10-CM | POA: Diagnosis present

## 2015-05-27 DIAGNOSIS — F331 Major depressive disorder, recurrent, moderate: Secondary | ICD-10-CM | POA: Insufficient documentation

## 2015-05-27 DIAGNOSIS — I10 Essential (primary) hypertension: Secondary | ICD-10-CM | POA: Insufficient documentation

## 2015-05-27 DIAGNOSIS — E782 Mixed hyperlipidemia: Secondary | ICD-10-CM | POA: Diagnosis not present

## 2015-05-27 DIAGNOSIS — F1310 Sedative, hypnotic or anxiolytic abuse, uncomplicated: Secondary | ICD-10-CM | POA: Diagnosis not present

## 2015-05-27 DIAGNOSIS — F32A Depression, unspecified: Secondary | ICD-10-CM

## 2015-05-27 DIAGNOSIS — F29 Unspecified psychosis not due to a substance or known physiological condition: Secondary | ICD-10-CM | POA: Diagnosis not present

## 2015-05-27 LAB — COMPREHENSIVE METABOLIC PANEL
ALT: 16 U/L (ref 14–54)
ANION GAP: 11 (ref 5–15)
AST: 26 U/L (ref 15–41)
Albumin: 4.6 g/dL (ref 3.5–5.0)
Alkaline Phosphatase: 122 U/L (ref 38–126)
BUN: 17 mg/dL (ref 6–20)
CHLORIDE: 100 mmol/L — AB (ref 101–111)
CO2: 29 mmol/L (ref 22–32)
CREATININE: 1.12 mg/dL — AB (ref 0.44–1.00)
Calcium: 9.6 mg/dL (ref 8.9–10.3)
GFR, EST NON AFRICAN AMERICAN: 54 mL/min — AB (ref >60)
Glucose, Bld: 107 mg/dL — ABNORMAL HIGH (ref 65–99)
POTASSIUM: 4.4 mmol/L (ref 3.5–5.1)
Sodium: 140 mmol/L (ref 135–145)
Total Bilirubin: 0.9 mg/dL (ref 0.3–1.2)
Total Protein: 8.8 g/dL — ABNORMAL HIGH (ref 6.5–8.1)

## 2015-05-27 LAB — RAPID URINE DRUG SCREEN, HOSP PERFORMED
Amphetamines: NOT DETECTED
BENZODIAZEPINES: POSITIVE — AB
Barbiturates: NOT DETECTED
COCAINE: NOT DETECTED
Opiates: NOT DETECTED
Tetrahydrocannabinol: NOT DETECTED

## 2015-05-27 LAB — CBC WITH DIFFERENTIAL/PLATELET
BASOS ABS: 0 10*3/uL (ref 0.0–0.1)
BASOS PCT: 0 %
EOS PCT: 1 %
Eosinophils Absolute: 0.1 10*3/uL (ref 0.0–0.7)
HCT: 40.9 % (ref 36.0–46.0)
Hemoglobin: 12.9 g/dL (ref 12.0–15.0)
Lymphocytes Relative: 33 %
Lymphs Abs: 2.1 10*3/uL (ref 0.7–4.0)
MCH: 25.5 pg — ABNORMAL LOW (ref 26.0–34.0)
MCHC: 31.5 g/dL (ref 30.0–36.0)
MCV: 80.8 fL (ref 78.0–100.0)
MONO ABS: 0.5 10*3/uL (ref 0.1–1.0)
Monocytes Relative: 7 %
NEUTROS ABS: 3.9 10*3/uL (ref 1.7–7.7)
Neutrophils Relative %: 59 %
PLATELETS: 343 10*3/uL (ref 150–400)
RBC: 5.06 MIL/uL (ref 3.87–5.11)
RDW: 14.4 % (ref 11.5–15.5)
WBC: 6.6 10*3/uL (ref 4.0–10.5)

## 2015-05-27 LAB — URINALYSIS, ROUTINE W REFLEX MICROSCOPIC
Bilirubin Urine: NEGATIVE
Glucose, UA: NEGATIVE mg/dL
Hgb urine dipstick: NEGATIVE
Ketones, ur: NEGATIVE mg/dL
NITRITE: NEGATIVE
PH: 5 (ref 5.0–8.0)
Protein, ur: NEGATIVE mg/dL
SPECIFIC GRAVITY, URINE: 1.017 (ref 1.005–1.030)

## 2015-05-27 LAB — URINE MICROSCOPIC-ADD ON
Bacteria, UA: NONE SEEN
RBC / HPF: NONE SEEN RBC/hpf (ref 0–5)

## 2015-05-27 LAB — ETHANOL

## 2015-05-27 LAB — SALICYLATE LEVEL: Salicylate Lvl: <4 mg/dL (ref 2.8–30.0)

## 2015-05-27 LAB — ACETAMINOPHEN LEVEL: Acetaminophen (Tylenol), Serum: <10 ug/mL — ABNORMAL LOW (ref 10–30)

## 2015-05-27 MED ORDER — LORAZEPAM 1 MG PO TABS: 1.0000 mg | ORAL_TABLET | Freq: Three times a day (TID) | ORAL | Status: DC | PRN

## 2015-05-27 MED ORDER — ACETAMINOPHEN 325 MG PO TABS: 650.0000 mg | ORAL_TABLET | ORAL | Status: DC | PRN

## 2015-05-27 MED ORDER — IBUPROFEN 200 MG PO TABS: 600.0000 mg | ORAL_TABLET | Freq: Three times a day (TID) | ORAL | Status: DC | PRN

## 2015-05-27 NOTE — ED Provider Notes (Signed)
CSN: [id number]     Arrival date & time 05/27/15  1816 History   First MD Initiated Contact with Patient 05/27/15 1904     Chief Complaint  Patient presents with  . Depression   HPI  Gina Espinoza is a 55 year old female with PMHx of HTN, fibromyalgia, depression and anxiety presenting with depressed mood and aggression. She reports calling her psychiatrist's office earlier today and speaking with the triage nurse. She will not sure what they talked about that the triage nurse apparently checked her to call 911 and go to the emergency department for severe depression. She is agitated during interview. She states "I need to get help for start hurting people". She states that she has had increased depression and aggression since Christmas. She cannot pinpoint a certain event that caused this. She states that she "needs to be alone in a quiet place else ominous in some people to the ER". She also notes that the triage nurse upset her and she "wanted to choke her, so it's a good thing I was not there". She denies suicidal ideation but states "if I wanted to kill myself, I wouldn't tell you now would I?". She mentions a psychiatric hospitalization 2 years ago but will not say for what. She states "I can feel myself heading back to that place and I just don't want to go there ". She is requesting "an upper "because all of her current psychiatric medications are "downers". She has no complaints at this time. Denies SI or AVH. Denies drug or alcohol use.   Past Medical History  Diagnosis Date  . Personal history of unspecified circulatory disease   . Chest pain, unspecified   . Unspecified essential hypertension   . Other and unspecified hyperlipidemia     mixed  . Headache(784.0)   . Cough   . Fibromyalgia   . Depression   . Anxiety    Past Surgical History  Procedure Laterality Date  . Hematoma evacuation      vaginal hematoma 05/23/02  . Anterior perineoplasty      posterior repair. 05/16/02  .  Bladder suspension    . Cardiac catheterization  2006    normal coronaries  . Esophagogastroduodenoscopy N/A 02/09/2013    Procedure: ESOPHAGOGASTRODUODENOSCOPY (EGD);  Surgeon: Milus Banister, MD;  Location: [facility];  Service: Endoscopy;  Laterality: N/A;  . Excision sebaceous cyst  11/2013    posterior scalp   Family History  Problem Relation Age of Onset  . Allergies Mother     also children  . Asthma Daughter   . Heart disease Father   . Breast cancer Maternal Grandmother   . Colon cancer Brother   . Asthma Grandchild    Social History  Substance Use Topics  . Smoking status: Never Smoker   . Smokeless tobacco: Never Used  . Alcohol Use: No   OB History    No data available     Review of Systems  Constitutional: Negative for fever and chills.  HENT: Negative for congestion, rhinorrhea and sore throat.   Respiratory: Negative for shortness of breath.   Cardiovascular: Negative for chest pain.  Gastrointestinal: Negative for nausea, vomiting and abdominal pain.  Genitourinary: Negative for dysuria and flank pain.  Musculoskeletal: Negative for myalgias, back pain and neck pain.  Skin: Negative for rash.  Neurological: Negative for syncope and headaches.  Psychiatric/Behavioral: Positive for dysphoric mood and agitation.  All other systems reviewed and are negative.  Allergies  Savella; Bee venom; Codeine; and Ivp dye  Home Medications   Prior to Admission medications   Medication Sig Start Date End Date Taking? Authorizing Provider  ALPRAZolam (XANAX) 1 MG tablet TAKE 1 TABLET EVERY 8 HOURS AS NEEDED FOR ANXIETY 05/18/15  Yes Historical Provider, MD  alprazolam (XANAX) 2 MG tablet TAKE 1 TABLET EVERY 12 HOURS AS NEEDED FOR ANXIETY 05/18/15  Yes Historical Provider, MD  amLODipine (NORVASC) 2.5 MG tablet TAKE 1 TABLET (2.5 MG TOTAL) BY MOUTH DAILY. 06/18/14  Yes Denita Lung, MD  CRESTOR 5 MG tablet TAKE 1 TABLET (5 MG TOTAL) BY MOUTH AT BEDTIME. 08/10/14   Yes Denita Lung, MD  diazepam (VALIUM) 5 MG tablet Take 5 mg by mouth every 6 (six) hours as needed for anxiety (1-2 tabs at a time).   Yes Historical Provider, MD  docusate sodium (COLACE) 100 MG capsule Take 1 capsule (100 mg total) by mouth 2 (two) times daily. 02/12/13  Yes Kinnie Feil, MD  escitalopram (LEXAPRO) 20 MG tablet Take 20 mg by mouth daily.   Yes Historical Provider, MD  fentaNYL (DURAGESIC - DOSED MCG/HR) 50 MCG/HR Place 50 mcg onto the skin every 3 (three) days.   Yes Historical Provider, MD  HYDROcodone-acetaminophen (HYCET) 7.5-325 mg/15 ml solution TAKE 15 ML BY MOUTH EVERY 6 HOURS AS NEEDED FOR PAIN 11/25/14  Yes Historical Provider, MD  HYDROcodone-acetaminophen (NORCO/VICODIN) 5-325 MG per tablet Take 1 tablet by mouth every 6 (six) hours as needed for pain. 02/12/13  Yes Kinnie Feil, MD  HYDROmorphone (DILAUDID) 2 MG tablet Take 1 tablet (2 mg total) by mouth every 4 (four) hours as needed for pain. 02/12/13  Yes Kinnie Feil, MD  levocetirizine (XYZAL) 5 MG tablet TAKE 1 TABLET (5 MG TOTAL) BY MOUTH EVERY EVENING. 05/05/15  Yes Rita Ohara, MD  lidocaine (LIDODERM) 5 % Place 1 patch onto the skin daily. Remove & Discard patch within 12 hours or as directed by MD//3 patches at a time, 12 hours on and 12 hours off    Yes Historical Provider, MD  Linaclotide (LINZESS) 290 MCG CAPS capsule Take 1 capsule (290 mcg total) by mouth daily. 12/28/14  Yes Milus Banister, MD  tamsulosin (FLOMAX) 0.4 MG CAPS capsule Take 0.4 mg by mouth daily. 04/28/15  Yes Historical Provider, MD  tiZANidine (ZANAFLEX) 2 MG tablet TAKE (1) TABLET BY MOUTH EVERY 6 TO 8 HOURS AS NEEDED**MAXIMUM OF 3 DOSES IN 24 HOURS** 05/12/15  Yes Historical Provider, MD  triamterene-hydrochlorothiazide (MAXZIDE) 75-50 MG tablet TAKE 1 TABLET BY MOUTH EVERY DAY AS NEEDED FOR FLUID RETENTION 05/05/15  Yes Historical Provider, MD  valACYclovir (VALTREX) 500 MG tablet Take 500 mg by mouth daily as needed  (infection).    Yes Historical Provider, MD  zolpidem (AMBIEN) 10 MG tablet Take 1 tablet (10 mg total) by mouth every other day. Alternates with temazepam 02/12/13  Yes Kinnie Feil, MD  amLODipine (NORVASC) 2.5 MG tablet TAKE 1 TABLET (2.5 MG TOTAL) BY MOUTH DAILY. Patient not taking: Reported on 05/27/2015 04/05/15   Denita Lung, MD  bethanechol (URECHOLINE) 10 MG tablet Take 10 mg by mouth 3 (three) times daily as needed (bladder spasms).    Historical Provider, MD  CRESTOR 5 MG tablet TAKE 1 TABLET (5 MG TOTAL) BY MOUTH AT BEDTIME. Patient not taking: Reported on 05/27/2015 04/05/15   Denita Lung, MD  EPINEPHrine 0.3 mg/0.3 mL IJ SOAJ injection Inject 0.3 mLs (0.3  mg total) into the muscle once. 12/07/14   Rita Ohara, MD  fluticasone (FLONASE) 50 MCG/ACT nasal spray Place 2 sprays into both nostrils daily. Patient not taking: Reported on 05/27/2015 12/07/14   Rita Ohara, MD  levocetirizine (XYZAL) 5 MG tablet TAKE 1 TABLET (5 MG TOTAL) BY MOUTH EVERY EVENING. Patient not taking: Reported on 05/27/2015 04/05/15   Denita Lung, MD  metaxalone (SKELAXIN) 800 MG tablet Take 800 mg by mouth 2 (two) times daily as needed (muscle spasms).     Historical Provider, MD  morphine (MS CONTIN) 15 MG 12 hr tablet Take 1 tablet (15 mg total) by mouth 2 (two) times daily as needed for pain. Patient not taking: Reported on 05/27/2015 02/12/13   Kinnie Feil, MD  nitroGLYCERIN (NITROSTAT) 0.4 MG SL tablet Place 1 tablet (0.4 mg total) under the tongue every 5 (five) minutes as needed. if pain persist call 911 Patient not taking: Reported on 12/07/2014 06/18/14   Denita Lung, MD  NITROSTAT 0.4 MG SL tablet PLACE 1 TAB ON TONGUE EVERY 5 MIN AS NEEDED IF PAIN PERSISTS, CALL 911 04/07/15   Denita Lung, MD  olopatadine (PATANOL) 0.1 % ophthalmic solution Place 1 drop into both eyes 2 (two) times daily as needed for allergies.     Historical Provider, MD  pantoprazole (PROTONIX) 40 MG tablet Take 1 tablet (40 mg  total) by mouth daily at 6 (six) AM. Patient not taking: Reported on 05/27/2015 02/12/13   Kinnie Feil, MD  polyethylene glycol (MIRALAX / GLYCOLAX) packet Take 17 g by mouth 2 (two) times daily. Patient taking differently: Take 17 g by mouth daily as needed for mild constipation.  02/12/13   Kinnie Feil, MD  predniSONE (DELTASONE) 50 MG tablet Take 1 pill daily for 5 days. Patient not taking: Reported on 05/27/2015 03/17/15   Melony Overly, MD   BP 116/80 mmHg  Pulse 79  Temp(Src) 98.3 F (36.8 C) (Oral)  Resp 18  SpO2 96% Physical Exam  Constitutional: She appears well-developed and well-nourished. No distress.  HENT:  Head: Normocephalic and atraumatic.  Eyes: Conjunctivae are normal. Right eye exhibits no discharge. Left eye exhibits no discharge. No scleral icterus.  Neck: Normal range of motion.  Cardiovascular: Normal rate, regular rhythm and normal heart sounds.   Pulmonary/Chest: Effort normal and breath sounds normal. No respiratory distress.  Abdominal: Soft. There is no tenderness.  Musculoskeletal: Normal range of motion.  Neurological: She is alert. Coordination normal.  Skin: Skin is warm and dry.  Psychiatric: Her mood appears anxious. Her affect is angry. She is agitated.  Pt extremely agitated during interview. Speech is coherent and focused. No SI, HI or AVH. She does frequently mention that she "is getting to the point where I might hurt somebody"  Nursing note and vitals reviewed.   ED Course  Procedures (including critical care time) Labs Review Labs Reviewed  COMPREHENSIVE METABOLIC PANEL - Abnormal; Notable for the following:    Chloride 100 (*)    Glucose, Bld 107 (*)    Creatinine, Ser 1.12 (*)    Total Protein 8.8 (*)    GFR calc non Af Amer 54 (*)    All other components within normal limits  CBC WITH DIFFERENTIAL/PLATELET - Abnormal; Notable for the following:    MCH 25.5 (*)    All other components within normal limits  URINE RAPID DRUG  SCREEN, HOSP PERFORMED - Abnormal; Notable for the following:    Benzodiazepines  POSITIVE (*)    All other components within normal limits  ACETAMINOPHEN LEVEL - Abnormal; Notable for the following:    Acetaminophen (Tylenol), Serum <10 (*)    All other components within normal limits  URINALYSIS, ROUTINE W REFLEX MICROSCOPIC (NOT AT [facility]) - Abnormal; Notable for the following:    APPearance CLOUDY (*)    Leukocytes, UA SMALL (*)    All other components within normal limits  URINE MICROSCOPIC-ADD ON - Abnormal; Notable for the following:    Squamous Epithelial / LPF 0-5 (*)    All other components within normal limits  ETHANOL  SALICYLATE LEVEL    Imaging Review No results found. I have personally reviewed and evaluated these images and lab results as part of my medical decision-making.   EKG Interpretation None      MDM   Final diagnoses:  Agitation  Depression   Patient presenting with depression and increased agitation x 2 weeks. Patient has no complaints at this time. VSS. Benign physical exam. Lab work unremarkable. Patient is medically cleared for psychiatric evaluation will be transferred to the psych ED. TTS consulted, home meds and psych holding orders placed.      Lahoma Crocker Debany Vantol, PA-C 05/27/15 2203  Lacretia Leigh, MD 05/27/15 [phone]

## 2015-05-27 NOTE — ED Notes (Signed)
Per EMS-states her psychiatrist told her to call 911 so she could be transported to ED to get medicine for her depression-no SI/HI-states she has been laying in the bed since Christmas day

## 2015-05-28 DIAGNOSIS — F331 Major depressive disorder, recurrent, moderate: Secondary | ICD-10-CM | POA: Diagnosis not present

## 2015-05-28 MED ORDER — MAGIC MOUTHWASH
10.0000 mL | Freq: Once | ORAL | Status: DC
  Filled 2015-05-28 (×2): qty 10

## 2015-05-28 MED ORDER — MAGIC MOUTHWASH
5.0000 mL | Freq: Once | ORAL | Status: AC
  Administered 2015-05-28: 5 mL via ORAL
  Filled 2015-05-28: qty 5

## 2015-05-28 NOTE — ED Notes (Signed)
Pt is going to stay overnight and see psychiatry in the am for possible outpatient therapy

## 2015-05-28 NOTE — ED Notes (Signed)
Jamison with [facility] at bedside explaining discharge instructions and follow up.

## 2015-05-28 NOTE — [facility] Suicide Risk Assessment (Signed)
Suicide Risk Assessment  Discharge Assessment   [facility] Discharge Suicide Risk Assessment   Demographic Factors:  NA  Total Time spent with patient: 45 minutes  Musculoskeletal: Strength & Muscle Tone: within normal limits Gait & Station: normal Patient leans: N/A  Psychiatric Specialty Exam: Review of Systems  Constitutional: Negative.   HENT: Negative.   Eyes: Negative.   Respiratory: Negative.   Cardiovascular: Negative.   Gastrointestinal: Negative.   Genitourinary: Negative.   Musculoskeletal: Negative.   Skin: Negative.   Neurological: Negative.   Endo/Heme/Allergies: Negative.   Psychiatric/Behavioral: Positive for depression.    Blood pressure 116/86, pulse 86, temperature 98.3 F (36.8 C), temperature source Oral, resp. rate 18, SpO2 100 %.There is no weight on file to calculate BMI.  General Appearance: Casual  Eye Contact::  Good  Speech:  Normal Rate  Volume:  Normal  Mood:  Depressed  Affect:  Congruent  Thought Process:  Coherent  Orientation:  Full (Time, Place, and Person)  Thought Content:  WDL  Suicidal Thoughts:  No  Homicidal Thoughts:  No  Memory:  Immediate;   Good Recent;   Good Remote;   Good  Judgement:  Good  Insight:  Good  Psychomotor Activity:  Normal  Concentration:  Good  Recall:  Good  Fund of Knowledge:Good  Language: Good  Akathisia:  No  Handed:  Right  AIMS (if indicated):     Assets:  Communication Skills Desire for Improvement Financial Resources/Insurance Housing Leisure Time Physical Health Resilience Social Support Transportation  ADL's:  Intact  Cognition: WNL  Sleep:      Has this patient used any form of tobacco in the last 30 days? (Cigarettes, Smokeless Tobacco, Cigars, and/or Pipes) No  Mental Status Per Nursing Assessment::   On Admission:   Depression  Current Mental Status by Physician: NA  Loss Factors: NA  Historical Factors: NA  Risk Reduction Factors:   Responsible for children under  60 years of age, Sense of responsibility to family, Living with another person, especially a relative, Positive social support and Positive therapeutic relationship  Continued Clinical Symptoms:  Depression, moderate  Cognitive Features That Contribute To Risk:  None    Suicide Risk:  Minimal: No identifiable suicidal ideation.  Patients presenting with no risk factors but with morbid ruminations; may be classified as minimal risk based on the severity of the depressive symptoms  Principal Problem: Depression, major, recurrent, moderate ([place]) Discharge Diagnoses:  Patient Active Problem List   Diagnosis Date Noted  . Depression, major, recurrent, moderate ([place]) [F33.1] 05/28/2015    Priority: High  . Abdominal pain [R10.9] 02/10/2013  . Constipation [K59.00] 02/10/2013  . Fibromyalgia syndrome [M79.7] 11/22/2011  . Depression [F32.9] 11/22/2011  . Headache [R51] 11/20/2008  . COUGH [R05] 11/05/2008  . Hyperlipidemia LDL goal <100 [E78.5] 11/02/2008  . Essential hypertension [I10] 11/02/2008  . Chest pain [R07.9] 11/02/2008  . History of cardiovascular disorder [Z86.79] 11/02/2008      Plan Of Care/Follow-up recommendations:  Activity:  as tolerated Diet:  heart healthy diet  Is patient on multiple antipsychotic therapies at discharge:  No   Has Patient had three or more failed trials of antipsychotic monotherapy by history:  No  Recommended Plan for Multiple Antipsychotic Therapies: NA    Shabana Armentrout, PMH-NP 05/28/2015, 11:14 AM
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
## 2015-05-28 NOTE — ED Notes (Signed)
[place], [place] and [place] RN working with patient to get the requested items of mouth wash and discharge paperwork.

## 2015-05-28 NOTE — ED Notes (Signed)
Pt came to triage demanding i get her mouthwash and to let her go home. I explained to the pt that I was unable to do this and that if she stepped back into her room I would have her RN come and talk to her. I informed her that the Dr. Had not placed her for discharge yet. Pt continued to be upset with me demanding I get her mouth wash and to discharge her. Alain[place] RN walked pt back to her room and inquired about mouthwash for pt.
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
## 2015-05-28 NOTE — Discharge Instructions (Addendum)
Major Depressive Disorder °Major depressive disorder is a mental illness. It also may be called clinical depression or unipolar depression. Major depressive disorder usually causes feelings of sadness, hopelessness, or helplessness. Some people with this disorder do not feel particularly sad but lose interest in doing things they used to enjoy (anhedonia). Major depressive disorder also can cause physical symptoms. It can interfere with work, school, relationships, and other normal everyday activities. The disorder varies in severity but is longer lasting and more serious than the sadness we all feel from time to time in our lives. °Major depressive disorder often is triggered by stressful life events or major life changes. Examples of these triggers include divorce, loss of your job or home, a move, and the death of a family member or close friend. Sometimes this disorder occurs for no obvious reason at all. People who have family members with major depressive disorder or bipolar disorder are at higher risk for developing this disorder, with or without life stressors. Major depressive disorder can occur at any age. It may occur just once in your life (single episode major depressive disorder). It may occur multiple times (recurrent major depressive disorder). °SYMPTOMS °People with major depressive disorder have either anhedonia or depressed mood on nearly a daily basis for at least 2 weeks or longer. Symptoms of depressed mood include: °· Feelings of sadness (blue or down in the dumps) or emptiness. °· Feelings of hopelessness or helplessness. °· Tearfulness or episodes of crying (may be observed by others). °· Irritability (children and adolescents). °In addition to depressed mood or anhedonia or both, people with this disorder have at least four of the following symptoms: °· Difficulty sleeping or sleeping too much.   °· Significant change (increase or decrease) in appetite or weight.   °· Lack of energy or  motivation. °· Feelings of guilt and worthlessness.   °· Difficulty concentrating, remembering, or making decisions. °· Unusually slow movement (psychomotor retardation) or restlessness (as observed by others).   °· Recurrent wishes for death, recurrent thoughts of self-harm (suicide), or a suicide attempt. °People with major depressive disorder commonly have persistent negative thoughts about themselves, other people, and the world. People with severe major depressive disorder may experience distorted beliefs or perceptions about the world (psychotic delusions). They also may see or hear things that are not real (psychotic hallucinations). °DIAGNOSIS °Major depressive disorder is diagnosed through an assessment by your health care provider. Your health care provider will ask about aspects of your daily life, such as mood, sleep, and appetite, to see if you have the diagnostic symptoms of major depressive disorder. Your health care provider may ask about your medical history and use of alcohol or drugs, including prescription medicines. Your health care provider also may do a physical exam and blood work. This is because certain medical conditions and the use of certain substances can cause major depressive disorder-like symptoms (secondary depression). Your health care provider also may refer you to a mental health specialist for further evaluation and treatment. °TREATMENT °It is important to recognize the symptoms of major depressive disorder and seek treatment. The following treatments can be prescribed for this disorder:   °· Medicine. Antidepressant medicines usually are prescribed. Antidepressant medicines are thought to correct chemical imbalances in the brain that are commonly associated with major depressive disorder. Other types of medicine may be added if the symptoms do not respond to antidepressant medicines alone or if psychotic delusions or hallucinations occur. °· Talk therapy. Talk therapy can be  helpful in treating major depressive disorder by providing   support, education, and guidance. Certain types of talk therapy also can help with negative thinking (cognitive behavioral therapy) and with relationship issues that trigger this disorder (interpersonal therapy). A mental health specialist can help determine which treatment is best for you. Most people with major depressive disorder do well with a combination of medicine and talk therapy. Treatments involving electrical stimulation of the brain can be used in situations with extremely severe symptoms or when medicine and talk therapy do not work over time. These treatments include electroconvulsive therapy, transcranial magnetic stimulation, and vagal nerve stimulation.   This information is not intended to replace advice given to you by your health care provider. Make sure you discuss any questions you have with your health care provider.   Document Released: 09/02/2012 Document Revised: 05/29/2014 Document Reviewed: 09/02/2012 Elsevier Interactive Patient Education 2016 [place] NEEDS:  You are advised to follow up with the Mental Health Intensive Outpatient Program ([place]) at [facility].  The program meets from 9:00 am - 12:00 pm Monday - Friday.  Your scheduled start date in Tuesday, 02/29/2016.  Plan to be there at 8:45 am on the first day.  Be sure to take your insurance cards with you on the first day.  If you have any questions, call Dellia Nims, MEd at the phone number indicated below:       [facility]      [facility]      [address]      [place], [place] [address]      Contact person: Dellia Nims, [place]      [phone]

## 2015-05-28 NOTE — ED Notes (Signed)
Gina Espinoza. TTS in for evaluation

## 2015-05-28 NOTE — ED Notes (Signed)
Bed: WA03 Expected date:  Expected time:  Means of arrival:  Comments: 

## 2015-05-28 NOTE — BH Assessment (Signed)
Bureau Assessment Progress Note  Per Corena Pilgrim, MD, this pt does not require psychiatric hospitalization at this time, but she would benefit from outpatient behavioral health services which she has had difficulty finding.  This Probation officer spoke to pt.  She agrees to start Mental Health Intensive Outpatient Programming (MH-IOP) at [facility].  Dellia Nims, MEd has scheduled pt to start program on Tuesday, 06/01/2015.  She is to present at 08:45 that morning.  Pt agrees to this.  Information has been included in pt's discharge instructions.  Pt's nurse has been notified.  Jalene Mullet, [place] Triage Specialist [phone]

## 2015-06-05 ENCOUNTER — Other Ambulatory Visit (HOSPITAL_COMMUNITY)
Admission: RE | Admit: 2015-06-05 | Discharge: 2015-06-05 | Disposition: A | Discharge status: Home / Self Care | Payer: Medicare Other | Source: Ambulatory Visit | Attending: Family Medicine | Admitting: Family Medicine

## 2015-06-05 ENCOUNTER — Encounter (HOSPITAL_COMMUNITY): Payer: Self-pay | Admitting: *Deleted

## 2015-06-05 ENCOUNTER — Emergency Department (INDEPENDENT_AMBULATORY_CARE_PROVIDER_SITE_OTHER)
Admission: EM | Admit: 2015-06-05 | Discharge: 2015-06-05 | Disposition: A | Discharge status: Home / Self Care | Payer: Medicare Other | Source: Home / Self Care

## 2015-06-05 DIAGNOSIS — Z113 Encounter for screening for infections with a predominantly sexual mode of transmission: Secondary | ICD-10-CM | POA: Diagnosis not present

## 2015-06-05 DIAGNOSIS — N952 Postmenopausal atrophic vaginitis: Secondary | ICD-10-CM

## 2015-06-05 DIAGNOSIS — N760 Acute vaginitis: Secondary | ICD-10-CM | POA: Insufficient documentation

## 2015-06-05 LAB — POCT URINALYSIS DIP (DEVICE)
GLUCOSE, UA: NEGATIVE mg/dL
KETONES UR: 15 mg/dL — AB
LEUKOCYTES UA: NEGATIVE
Nitrite: NEGATIVE
Protein, ur: NEGATIVE mg/dL
Urobilinogen, UA: 0.2 mg/dL (ref 0.0–1.0)
pH: 5 (ref 5.0–8.0)

## 2015-06-05 NOTE — ED Provider Notes (Signed)
CSN: [id number]     Arrival date & time 06/05/15  1916 History   None    Chief Complaint  Patient presents with  . Vaginal Discharge   (Consider location/radiation/quality/duration/timing/severity/associated sxs/prior Treatment) Patient is a 55 y.o. female presenting with vaginal discharge. The history is provided by the patient.  Vaginal Discharge Quality:  Yellow, watery and thin Severity:  Mild Onset quality:  Gradual Duration:  1 week Progression:  Unchanged Chronicity:  New Context: spontaneously   Ineffective treatments:  Prescription medications (tried diflucan and vag cr without relief, there is no pain, no bleeding and no odor) Associated symptoms: no fever, no genital lesions and no vaginal itching   Risk factors: no PID and no STI     Past Medical History  Diagnosis Date  . Personal history of unspecified circulatory disease   . Chest pain, unspecified   . Unspecified essential hypertension   . Other and unspecified hyperlipidemia     mixed  . Headache(784.0)   . Cough   . Fibromyalgia   . Depression   . Anxiety    Past Surgical History  Procedure Laterality Date  . Hematoma evacuation      vaginal hematoma 05/23/02  . Anterior perineoplasty      posterior repair. 05/16/02  . Bladder suspension    . Cardiac catheterization  2006    normal coronaries  . Esophagogastroduodenoscopy N/A 02/09/2013    Procedure: ESOPHAGOGASTRODUODENOSCOPY (EGD);  Surgeon: Milus Banister, MD;  Location: [facility];  Service: Endoscopy;  Laterality: N/A;  . Excision sebaceous cyst  11/2013    posterior scalp   Family History  Problem Relation Age of Onset  . Allergies Mother     also children  . Asthma Daughter   . Heart disease Father   . Breast cancer Maternal Grandmother   . Colon cancer Brother   . Asthma Grandchild    Social History  Substance Use Topics  . Smoking status: Never Smoker   . Smokeless tobacco: Never Used  . Alcohol Use: No   OB History    No  data available     Review of Systems  Constitutional: Negative.  Negative for fever.  Gastrointestinal: Negative.   Genitourinary: Positive for vaginal discharge. Negative for frequency, vaginal bleeding, genital sores and vaginal pain.  Musculoskeletal: Negative.   All other systems reviewed and are negative.   Allergies  Savella; Bee venom; Codeine; and Ivp dye  Home Medications   Prior to Admission medications   Medication Sig Start Date End Date Taking? Authorizing Provider  ALPRAZolam (XANAX) 1 MG tablet TAKE 1 TABLET EVERY 8 HOURS AS NEEDED FOR ANXIETY 05/18/15   Historical Provider, MD  alprazolam (XANAX) 2 MG tablet TAKE 1 TABLET EVERY 12 HOURS AS NEEDED FOR ANXIETY 05/18/15   Historical Provider, MD  amLODipine (NORVASC) 2.5 MG tablet TAKE 1 TABLET (2.5 MG TOTAL) BY MOUTH DAILY. 06/18/14   Denita Lung, MD  amLODipine (NORVASC) 2.5 MG tablet TAKE 1 TABLET (2.5 MG TOTAL) BY MOUTH DAILY. Patient not taking: Reported on 05/27/2015 04/05/15   Denita Lung, MD  bethanechol (URECHOLINE) 10 MG tablet Take 10 mg by mouth 3 (three) times daily as needed (bladder spasms).    Historical Provider, MD  CRESTOR 5 MG tablet TAKE 1 TABLET (5 MG TOTAL) BY MOUTH AT BEDTIME. 08/10/14   Denita Lung, MD  CRESTOR 5 MG tablet TAKE 1 TABLET (5 MG TOTAL) BY MOUTH AT BEDTIME. Patient not taking: Reported on 05/27/2015  04/05/15   Denita Lung, MD  diazepam (VALIUM) 5 MG tablet Take 5 mg by mouth every 6 (six) hours as needed for anxiety (1-2 tabs at a time).    Historical Provider, MD  docusate sodium (COLACE) 100 MG capsule Take 1 capsule (100 mg total) by mouth 2 (two) times daily. 02/12/13   Kinnie Feil, MD  EPINEPHrine 0.3 mg/0.3 mL IJ SOAJ injection Inject 0.3 mLs (0.3 mg total) into the muscle once. 12/07/14   Rita Ohara, MD  escitalopram (LEXAPRO) 20 MG tablet Take 20 mg by mouth daily.    Historical Provider, MD  fentaNYL (DURAGESIC - DOSED MCG/HR) 50 MCG/HR Place 50 mcg onto the skin every  3 (three) days.    Historical Provider, MD  fluticasone (FLONASE) 50 MCG/ACT nasal spray Place 2 sprays into both nostrils daily. Patient not taking: Reported on 05/27/2015 12/07/14   Rita Ohara, MD  HYDROcodone-acetaminophen (HYCET) 7.5-325 mg/15 ml solution TAKE 15 ML BY MOUTH EVERY 6 HOURS AS NEEDED FOR PAIN 11/25/14   Historical Provider, MD  HYDROcodone-acetaminophen (NORCO/VICODIN) 5-325 MG per tablet Take 1 tablet by mouth every 6 (six) hours as needed for pain. 02/12/13   Kinnie Feil, MD  HYDROmorphone (DILAUDID) 2 MG tablet Take 1 tablet (2 mg total) by mouth every 4 (four) hours as needed for pain. 02/12/13   Kinnie Feil, MD  levocetirizine (XYZAL) 5 MG tablet TAKE 1 TABLET (5 MG TOTAL) BY MOUTH EVERY EVENING. Patient not taking: Reported on 05/27/2015 04/05/15   Denita Lung, MD  levocetirizine (XYZAL) 5 MG tablet TAKE 1 TABLET (5 MG TOTAL) BY MOUTH EVERY EVENING. 05/05/15   Rita Ohara, MD  lidocaine (LIDODERM) 5 % Place 1 patch onto the skin daily. Remove & Discard patch within 12 hours or as directed by MD//3 patches at a time, 12 hours on and 12 hours off     Historical Provider, MD  Linaclotide (LINZESS) 290 MCG CAPS capsule Take 1 capsule (290 mcg total) by mouth daily. 12/28/14   Milus Banister, MD  metaxalone (SKELAXIN) 800 MG tablet Take 800 mg by mouth 2 (two) times daily as needed (muscle spasms).     Historical Provider, MD  morphine (MS CONTIN) 15 MG 12 hr tablet Take 1 tablet (15 mg total) by mouth 2 (two) times daily as needed for pain. Patient not taking: Reported on 05/27/2015 02/12/13   Kinnie Feil, MD  nitroGLYCERIN (NITROSTAT) 0.4 MG SL tablet Place 1 tablet (0.4 mg total) under the tongue every 5 (five) minutes as needed. if pain persist call 911 Patient not taking: Reported on 12/07/2014 06/18/14   Denita Lung, MD  NITROSTAT 0.4 MG SL tablet PLACE 1 TAB ON TONGUE EVERY 5 MIN AS NEEDED IF PAIN PERSISTS, CALL 911 04/07/15   Denita Lung, MD  olopatadine (PATANOL)  0.1 % ophthalmic solution Place 1 drop into both eyes 2 (two) times daily as needed for allergies.     Historical Provider, MD  pantoprazole (PROTONIX) 40 MG tablet Take 1 tablet (40 mg total) by mouth daily at 6 (six) AM. Patient not taking: Reported on 05/27/2015 02/12/13   Kinnie Feil, MD  polyethylene glycol (MIRALAX / GLYCOLAX) packet Take 17 g by mouth 2 (two) times daily. Patient taking differently: Take 17 g by mouth daily as needed for mild constipation.  02/12/13   Kinnie Feil, MD  predniSONE (DELTASONE) 50 MG tablet Take 1 pill daily for 5 days. Patient not taking:  Reported on 05/27/2015 03/17/15   Melony Overly, MD  tamsulosin (FLOMAX) 0.4 MG CAPS capsule Take 0.4 mg by mouth daily. 04/28/15   Historical Provider, MD  tiZANidine (ZANAFLEX) 2 MG tablet TAKE (1) TABLET BY MOUTH EVERY 6 TO 8 HOURS AS NEEDED**MAXIMUM OF 3 DOSES IN 24 HOURS** 05/12/15   Historical Provider, MD  triamterene-hydrochlorothiazide (MAXZIDE) 75-50 MG tablet TAKE 1 TABLET BY MOUTH EVERY DAY AS NEEDED FOR FLUID RETENTION 05/05/15   Historical Provider, MD  valACYclovir (VALTREX) 500 MG tablet Take 500 mg by mouth daily as needed (infection).     Historical Provider, MD  zolpidem (AMBIEN) 10 MG tablet Take 1 tablet (10 mg total) by mouth every other day. Alternates with temazepam 02/12/13   Kinnie Feil, MD   Meds Ordered and Administered this Visit  Medications - No data to display  BP 140/93 mmHg  Pulse 67  Temp(Src) 97.8 F (36.6 C) (Oral)  Resp 18  SpO2 97% No data found.   Physical Exam  Constitutional: She is oriented to person, place, and time. She appears well-developed and well-nourished. No distress.  Abdominal: Soft. Bowel sounds are normal. There is no tenderness.  Genitourinary: Vagina normal and uterus normal. Pelvic exam was performed with patient supine. There is no rash or lesion on the right labia. There is no rash or lesion on the left labia. Cervix exhibits no motion tenderness and  no discharge. Right adnexum displays no mass and no tenderness. Left adnexum displays no mass and no tenderness. No erythema or tenderness in the vagina. No vaginal discharge found.  Neurological: She is alert and oriented to person, place, and time.  Skin: Skin is warm and dry.  Nursing note and vitals reviewed.   ED Course  Procedures (including critical care time)  Labs Review Labs Reviewed  POCT URINALYSIS DIP (DEVICE) - Abnormal; Notable for the following:    Bilirubin Urine SMALL (*)    Ketones, ur 15 (*)    Hgb urine dipstick TRACE (*)    All other components within normal limits  CERVICOVAGINAL ANCILLARY ONLY    Imaging Review No results found.   Visual Acuity Review  Right Eye Distance:   Left Eye Distance:   Bilateral Distance:    Right Eye Near:   Left Eye Near:    Bilateral Near:         MDM   1. Atrophic vaginitis        Billy Fischer, MD 06/05/15 2113

## 2015-06-05 NOTE — Discharge Instructions (Signed)
We will call with positive test results and treat as indicated, contact your gyn doctor for further care if needed.

## 2015-06-05 NOTE — ED Notes (Signed)
The  Patient     Reports       Vaginal  Discharge      X   8  Days            Burns   When   She   [place]

## 2015-06-07 ENCOUNTER — Ambulatory Visit: Payer: Medicare Other | Admitting: Family Medicine

## 2015-06-07 LAB — CERVICOVAGINAL ANCILLARY ONLY
Chlamydia: NEGATIVE
NEISSERIA GONORRHEA: NEGATIVE

## 2015-06-08 ENCOUNTER — Ambulatory Visit (INDEPENDENT_AMBULATORY_CARE_PROVIDER_SITE_OTHER): Payer: Medicare Other | Admitting: Family Medicine

## 2015-06-08 ENCOUNTER — Encounter: Payer: Self-pay | Admitting: Family Medicine

## 2015-06-08 VITALS — BP 120/80 | HR 64 | Temp 98.4°F | Wt 165.2 lb

## 2015-06-08 DIAGNOSIS — E559 Vitamin D deficiency, unspecified: Secondary | ICD-10-CM

## 2015-06-08 DIAGNOSIS — F331 Major depressive disorder, recurrent, moderate: Secondary | ICD-10-CM

## 2015-06-08 DIAGNOSIS — R079 Chest pain, unspecified: Secondary | ICD-10-CM

## 2015-06-08 DIAGNOSIS — E785 Hyperlipidemia, unspecified: Secondary | ICD-10-CM

## 2015-06-08 DIAGNOSIS — J309 Allergic rhinitis, unspecified: Secondary | ICD-10-CM | POA: Diagnosis not present

## 2015-06-08 DIAGNOSIS — M797 Fibromyalgia: Secondary | ICD-10-CM | POA: Diagnosis not present

## 2015-06-08 DIAGNOSIS — I10 Essential (primary) hypertension: Secondary | ICD-10-CM

## 2015-06-08 DIAGNOSIS — Z8679 Personal history of other diseases of the circulatory system: Secondary | ICD-10-CM | POA: Diagnosis not present

## 2015-06-08 DIAGNOSIS — R519 Headache, unspecified: Secondary | ICD-10-CM

## 2015-06-08 DIAGNOSIS — R51 Headache: Secondary | ICD-10-CM

## 2015-06-08 DIAGNOSIS — Z23 Encounter for immunization: Secondary | ICD-10-CM | POA: Diagnosis not present

## 2015-06-08 DIAGNOSIS — K5900 Constipation, unspecified: Secondary | ICD-10-CM | POA: Diagnosis not present

## 2015-06-08 DIAGNOSIS — R1013 Epigastric pain: Secondary | ICD-10-CM

## 2015-06-08 LAB — LIPID PANEL
Cholesterol: 139 mg/dL (ref 125–200)
HDL: 48 mg/dL (ref >=46)
LDL CALC: 77 mg/dL (ref <130)
TRIGLYCERIDES: 68 mg/dL (ref <150)
Total CHOL/HDL Ratio: 2.9 Ratio (ref <=5.0)
VLDL: 14 mg/dL (ref <30)

## 2015-06-08 LAB — CERVICOVAGINAL ANCILLARY ONLY: WET PREP (BD AFFIRM): NEGATIVE

## 2015-06-08 MED ORDER — AMLODIPINE BESYLATE 2.5 MG PO TABS: ORAL_TABLET | ORAL | Status: DC

## 2015-06-08 MED ORDER — PANTOPRAZOLE SODIUM 40 MG PO TBEC: 40.0000 mg | DELAYED_RELEASE_TABLET | Freq: Every day | ORAL | Status: DC

## 2015-06-08 MED ORDER — LEVOCETIRIZINE DIHYDROCHLORIDE 5 MG PO TABS: ORAL_TABLET | ORAL | Status: DC

## 2015-06-08 MED ORDER — ROSUVASTATIN CALCIUM 5 MG PO TABS: ORAL_TABLET | ORAL | Status: DC

## 2015-06-08 NOTE — Patient Instructions (Signed)
A good multivitamin that has vitamin D

## 2015-06-08 NOTE — Progress Notes (Signed)
   Subjective:    Patient ID: Gina Espinoza, female    DOB: 1961/01/17, 55 y.o.   MRN: [id number]  HPI She is here for an interval evaluation. She recently had difficulty with anxiety and depression was seen in the emergency room. She was referred for outpatient care however she is decided to get her care at the [facility] [place]. She has been getting Xanax and Ambien from her gynecologist. She also gets medications from Dr. Estanislado Pandy for treatment of her underlying Fibromyalgia syndrome and chronic headache..She also has a history of vitamin D deficiency and was given 50,000 units regularly however she cannot remember when that test was last done. Review his record indicates several years ago. She also has hypertension and is on Norvasc. She has a history of chest pain and has seen Dr. Verl Blalock in the past. She rarely uses nitroglycerin. She continues have difficulty with constipation and does see Dr. Ardis Hughs for this. She is on medication for this. She seems to have her allergies under good control with the Xyzal. She does have reflux symptoms and would like a renewal of her PPI.She continues on Crestor for her hyperlipidemia. She was also seen recently in the emergency room and diagnosed with atrophic vaginitis.  Review of Systems     Objective:   Physical Exam Alert and in no distress. Tympanic membranes and canals are normal. Pharyngeal area is normal. Neck is supple without adenopathy or thyromegaly. Cardiac exam shows a regular sinus rhythm without murmurs or gallops. Lungs are clear to auscultation. Family and social history as well as health maintenance and immunizations were reviewed as well as the emergency room reco       Assessment & Plan:  Depression, major, recurrent, moderate ([place])  Need for prophylactic vaccination and inoculation against influenza - Plan: Flu Vaccine QUAD 36+ mos IM  Essential hypertension - Plan: amLODipine (NORVASC) 2.5 MG  tablet  Hyperlipidemia LDL goal <100 - Plan: Lipid panel, rosuvastatin (CRESTOR) 5 MG tablet  Constipation, unspecified constipation type  Fibromyalgia syndrome  Nonintractable headache, unspecified chronicity pattern, unspecified headache type  History of cardiovascular disorder  Epigastric pain  Chest pain, unspecified chest pain type - Plan: pantoprazole (PROTONIX) 40 MG tablet  Vitamin D insufficiency - Plan: VITAMIN D 25 Hydroxy (Vit-D Deficiency, Fractures)  Allergic rhinitis, unspecified allergic rhinitis type - Plan: levocetirizine (XYZAL) 5 MG tablet All her medications Were reviewed with her. She seems to be using them appropriately. Encouraged her to get involved in counseling to help deal with the depression. Cautioned her on getting medicines from various physicians all which can have psychotropic properties to them. I explained that I would work with her concerning this. Explained that getting medication from her gynecologist for Xanax and Ambien would probably better be handled by Dr. Estanislado Pandy and perhaps this can be changed depending upon [facility]. Also discussed the ER visit for the vaginitis and encourage her to always come here rather than ER.Over 45 minutes, greater than 50% spent in counseling and coordination of care.

## 2015-06-09 LAB — VITAMIN D 25 HYDROXY (VIT D DEFICIENCY, FRACTURES): Vit D, 25-Hydroxy: 36 ng/mL (ref 30–100)

## 2015-06-15 DIAGNOSIS — N760 Acute vaginitis: Secondary | ICD-10-CM | POA: Diagnosis not present

## 2015-06-15 DIAGNOSIS — M797 Fibromyalgia: Secondary | ICD-10-CM | POA: Diagnosis not present

## 2015-07-02 ENCOUNTER — Telehealth: Payer: Self-pay | Admitting: Family Medicine

## 2015-07-02 ENCOUNTER — Other Ambulatory Visit: Payer: Self-pay | Admitting: Medical

## 2015-07-02 MED ORDER — AZELASTINE HCL 0.1 % NA SOLN: 2.0000 | Freq: Two times a day (BID) | NASAL | Status: DC

## 2015-07-02 NOTE — Telephone Encounter (Signed)
Pt stopped by and was wanting to know if you could up her levocetirizine 5 mg and uses Flonase and has been taking Benadryl, said she is sneezing like crazy, eyes are itching, said the weather is kicking her butt, is very congested, and very stopped up, and wants to know if she can take anything else for the allergies. Please advise pt would like something today, did tell pt you was out of the office today, and it could be Monday, pt uses  [id number] - [place],  - [place] and can be reached at [phone]

## 2015-07-02 NOTE — Telephone Encounter (Signed)
Pt is aware and was upset that she feels like that will not help her but said "i guess ill try it but it defeats the purpose of my call" i told her if she doesn't feel better by Monday come in

## 2015-07-02 NOTE — Telephone Encounter (Signed)
This is Dr. Lanice Shirts patient . She can add OTC eye drop for allergies if needed.   We can always try different nose spray or oral tablet.   I'll send Astepro nasal to pharmacy as and add on to see if this helps.

## 2015-07-14 ENCOUNTER — Telehealth: Payer: Self-pay | Admitting: Family Medicine

## 2015-07-17 NOTE — Telephone Encounter (Signed)
Faxed P.A. Levocetirizine

## 2015-07-19 ENCOUNTER — Telehealth: Payer: Self-pay | Admitting: Gastroenterology

## 2015-07-20 DIAGNOSIS — M797 Fibromyalgia: Secondary | ICD-10-CM | POA: Diagnosis not present

## 2015-07-20 MED ORDER — LINACLOTIDE 290 MCG PO CAPS: 290.0000 ug | ORAL_CAPSULE | Freq: Every day | ORAL | Status: DC

## 2015-07-20 NOTE — Telephone Encounter (Signed)
Recv'd response back stating that levocetirizine is on plan's list of covered drugs so no P.A. needed

## 2015-07-20 NOTE — Telephone Encounter (Signed)
Left message on machine to call back pt needs appt before refills can be given

## 2015-07-20 NOTE — Telephone Encounter (Signed)
Pt will call back and set up appt for further refills, the schedule is not out today 1 month refill sent

## 2015-08-02 ENCOUNTER — Other Ambulatory Visit: Payer: Self-pay | Admitting: Medical

## 2015-08-06 ENCOUNTER — Encounter: Payer: Self-pay | Admitting: Family Medicine

## 2015-08-06 ENCOUNTER — Ambulatory Visit (INDEPENDENT_AMBULATORY_CARE_PROVIDER_SITE_OTHER): Payer: Medicare Other | Admitting: Family Medicine

## 2015-08-06 VITALS — BP 130/80 | HR 64 | Wt 175.2 lb

## 2015-08-06 DIAGNOSIS — L02411 Cutaneous abscess of right axilla: Secondary | ICD-10-CM | POA: Diagnosis not present

## 2015-08-06 DIAGNOSIS — M79671 Pain in right foot: Secondary | ICD-10-CM | POA: Diagnosis not present

## 2015-08-06 DIAGNOSIS — J302 Other seasonal allergic rhinitis: Secondary | ICD-10-CM | POA: Diagnosis not present

## 2015-08-06 MED ORDER — MONTELUKAST SODIUM 10 MG PO TABS: 10.0000 mg | ORAL_TABLET | Freq: Every day | ORAL | Status: DC

## 2015-08-06 MED ORDER — CETIRIZINE HCL 10 MG PO TABS: 10.0000 mg | ORAL_TABLET | Freq: Every day | ORAL | Status: DC

## 2015-08-06 NOTE — Progress Notes (Signed)
   Subjective:    Patient ID: Gina Espinoza, female    DOB: 1960-09-03, 55 y.o.   MRN: [id number]  HPI Chief Complaint  Patient presents with  . multiple issues    multiple issues- right foot- heel pain, swollen, woke up this morning hurting, has boil under right arm. having allergies   She is here with complaints of sneezing, eyes running, itching and nasal congestion for past week. She is taking Flonase, astelin spray occasionally, and Xyzal daily. Reports chronic allergies, not sure of triggers. States Xyzal does not seem to be helping.   She also complains of right heel pain that started this morning after getting out of bed. She denies injury. The pain is nonradiating and she describes it as a soreness that is made worse with weightbearing and walking and relieved at rest.  She has another complaint of abscess to her right axilla area. This has been present for several days. She denies history of abscess to this area. No history of MRSA. No history of diabetes. She is immunocompetent. States she pressed on the sore approximately 2 days ago and it drained for several hours and has felt much better since then.  Denies fever, chills, dizziness, fatigue, nausea, vomiting, diarrhea.    Review of Systems Pertinent positives and negatives in the history of present illness.     Objective:   Physical Exam BP 130/80 mmHg  Pulse 64  Wt 175 lb 3.2 oz (79.47 kg) Alert and in no distress. No sinus tenderness. Nares red, edematous, and clear drainage. Tympanic membranes and canals are normal. Pharyngeal area is normal. Neck is supple without adenopathy or thyromegaly. Cardiac exam shows a regular sinus rhythm without murmurs or gallops. Lungs are clear to auscultation. Right axilla with abscess, mild induration without fluctuance or drainage, no erythema or warmth. Right heel with tenderness, no erythema, warmth or edema, foot exam otherwise normal with normal sensation, pulse, ROM and strength.        Assessment & Plan:  Seasonal allergies - Plan: montelukast (SINGULAIR) 10 MG tablet, cetirizine (ZYRTEC) 10 MG tablet  Heel pain, right  Abscess of axilla, right  Discussed that her allergies do not appear to be well controlled and she does not feel like the levocetirizine is working for her. Recommend discontinuing levocetirizine and switching to cetirizine daily and see if this helps. Samples of Zyrtec given. Prescription for Singulair sent to pharmacy. She will let me know if this is helping with her allergy symptoms.  Discussed soaking her right heel and taking ibuprofen as needed for pain. No obvious reason for his heel discomfort and no concern for infection. She will let me know if this is not improving.  Recommend warm compresses to right axilla and to let me know if this area worsens. She reports that the abscess has improved significantly since draining on it's own and no sign of infection. No antibiotic needed at this time.

## 2015-08-06 NOTE — Patient Instructions (Addendum)
Continue using Flonase daily and taking Astelin spray as needed. Stop levocetirizine and start taking cetirizine daily in the evening. Start taking Singulair daily in the evening Stay well hydrated  Use warm compresses to the right armpit and let us know if it gets worse.   For the right heel, use warm soaks 15-20 minutes and do the stretches that we talked about. You can take ibuprofen if needed for pain.

## 2015-08-17 DIAGNOSIS — M797 Fibromyalgia: Secondary | ICD-10-CM | POA: Diagnosis not present

## 2015-08-31 ENCOUNTER — Other Ambulatory Visit: Payer: Self-pay | Admitting: Medical

## 2015-09-01 NOTE — Telephone Encounter (Signed)
Gina Espinoza pt, Is this ok to refill?

## 2015-09-16 DIAGNOSIS — M797 Fibromyalgia: Secondary | ICD-10-CM | POA: Diagnosis not present

## 2015-09-20 ENCOUNTER — Ambulatory Visit: Payer: Medicare Other | Admitting: Family Medicine

## 2015-10-13 DIAGNOSIS — M797 Fibromyalgia: Secondary | ICD-10-CM | POA: Diagnosis not present

## 2015-10-25 ENCOUNTER — Other Ambulatory Visit: Payer: Self-pay | Admitting: Surgery

## 2015-10-25 DIAGNOSIS — R2231 Localized swelling, mass and lump, right upper limb: Secondary | ICD-10-CM

## 2015-10-25 DIAGNOSIS — R2230 Localized swelling, mass and lump, unspecified upper limb: Secondary | ICD-10-CM | POA: Insufficient documentation

## 2015-10-28 ENCOUNTER — Other Ambulatory Visit: Payer: Self-pay | Admitting: Family Medicine

## 2015-10-28 NOTE — Telephone Encounter (Signed)
Is this okay to refill? 

## 2015-11-02 ENCOUNTER — Other Ambulatory Visit: Payer: Self-pay | Admitting: Surgery

## 2015-11-02 DIAGNOSIS — R2231 Localized swelling, mass and lump, right upper limb: Secondary | ICD-10-CM

## 2015-11-03 ENCOUNTER — Other Ambulatory Visit: Payer: Self-pay | Admitting: Surgery

## 2015-11-03 ENCOUNTER — Ambulatory Visit
Admission: RE | Admit: 2015-11-03 | Discharge: 2015-11-03 | Disposition: A | Discharge status: Home / Self Care | Payer: Medicare Other | Source: Ambulatory Visit | Attending: Surgery | Admitting: Surgery

## 2015-11-03 DIAGNOSIS — R2231 Localized swelling, mass and lump, right upper limb: Secondary | ICD-10-CM

## 2015-11-03 DIAGNOSIS — N63 Unspecified lump in breast: Secondary | ICD-10-CM | POA: Diagnosis not present

## 2015-11-05 ENCOUNTER — Ambulatory Visit: Payer: Self-pay | Admitting: Surgery

## 2015-11-05 NOTE — H&P (Signed)
History of Present Illness Imogene Burn. Romi Rathel MD; 10/25/2015 12:02 PM) The patient is a 55 year old female who presents with a complaint of Mass. PCP - [employer]  This is a 55 year old female with a history of fibromyalgia, angina, hypertension, chronic headaches, who presents with a six month history of a slowly enlarging mass in her right axilla. The skin overlying the mass has never become reddened. There has been no drainage from this area. However the patient does report some fluctuation in the size of the mass. She states a gets fairly large. Her seems to be a firm cord extending into the upper part of her medial right arm. Some of the pain radiates towards the back of her shoulders well. No history of blood clots.  CLINICAL DATA: Patient presents for evaluation of a right axillary mass and a linear firm abnormality in medial upper right arm.  EXAM: ULTRASOUND OF THE RIGHT AXILLA  COMPARISON: None  FINDINGS: On physical exam,there is a palpable, relatively soft, superficial mass in the right axilla that bulges the overlying skin. There is a firm linear superficial rope like abnormality along the medial upper right arm.  Ultrasound is performed, showing a hypoechoic superficial mass in the right axilla, which extends from the deep dermis. Margins are partly irregular. Mass measures approximately 3 x 2.4 x 1.0 cm. There is a new small apparent tract extending from the mass to the skin surface. Along the medial upper right arm subtle linear area of hypo echogenicity with adjacent hyper echogenicity. There is no convincing blood flow within this on color Doppler analysis. This may reflect a thrombosed vein with associated inflammation.  IMPRESSION: 1. No evidence of neoplastic disease. No abnormal or enlarged lymph nodes. 2. Superficial hypoechoic abnormality contiguous with the dermis and represents the palpable axillary abnormality. This measures 3 cm in greatest  dimension. This is most likely a sebaceous cyst that has been previously infected/ inflamed. 3. Subtle abnormality along the superficial upper medial right arm may reflect superficial thrombophlebitis.  RECOMMENDATION: Screening mammography per normal screening schedule. Patient is scheduled to have the axillary mass surgically removed.  I have discussed the findings and recommendations with the patient. Results were also provided in writing at the conclusion of the visit. If applicable, a reminder letter will be sent to the patient regarding the next appointment.  BI-RADS CATEGORY 2: Benign Finding(s)   Electronically Signed  By: Lajean Manes M.D.  On: 11/03/2015 11:41 Allergies (Sonya Bynum, CMA; 10/25/2015 11:20 AM) [name] *PSYCHOTHERAPEUTIC AND NEUROLOGICAL AGENTS - MISC.* Codeine Phosphate *ANALGESICS - OPIOID* Dyes Insect Stings BEE STING  Medication History (Sonya Bynum, CMA; 10/25/2015 11:20 AM) Promethazine HCl (25MG  Tablet, Oral) Active. FentaNYL (50MCG/HR Patch 72HR, Transdermal As needed) Active. OxyCODONE HCl (10MG  Tablet, Oral As needed) Active. Hydrocodone-Acetaminophen (10-325MG  Tablet, Oral As needed) Active. ALPRAZolam (2MG  Tablet, 1MG  Oral As needed) Active. Zolpidem Tartrate (10MG  Tablet, Oral As needed) Active. Acyclovir (400MG  Tablet, Oral As needed) Active. AmLODIPine Besylate (2.5MG  Tablet, Oral Daily) Active. Clotrimazole (10MG  Troche, Mouth/Throat As needed) Active. Crestor (5MG  Tablet, Oral Daily) Active. Linzess (290MCG Capsule, Oral Daily) Active. Nitrostat (0.4MG  Tab Sublingual, Sublingual As needed) Active. Triamcinolone Acetonide (0.1% Cream, External As needed) Active. Urecholine (10MG  Tablet, Oral As needed) Active. Mycelex (10MG  Troche, Mouth/Throat As needed) Active. Valium (5MG  Tablet, Oral As needed) Active. Colace (100MG  Capsule, Oral As needed) Active. Lexapro (20MG  Tablet, Oral Daily) Active. Dilaudid  (2MG  Tablet, Oral As needed) Active. Skelid (200MG  Tablet, Oral) Active. Skelaxin (400MG  Tablet, Oral As needed)  Active. Flagyl (500MG  Tablet, Oral As needed) Active. Macrobid (100MG  Capsule, Oral As needed) Active. Patanol (0.1% Solution, Ophthalmic As needed) Active. Medications Reconciled  Vitals (Sonya Bynum CMA; 10/25/2015 11:20 AM) 10/25/2015 11:20 AM Weight: 170 lb Height: 66in Body Surface Area: 1.87 m Body Mass Index: 27.44 kg/m  Temp.: 63F(Temporal)  Pulse: 76 (Regular)  BP: 128/80 (Sitting, Left Arm, Standard)       Physical Exam Rodman Key K. Laurelin Elson MD; 10/25/2015 12:03 PM) The physical exam findings are as follows: Note:WDWN in NAD Right axilla - oval 4 cm subcutaneous mass; no skin changes; soft, no erythema. no drainage Extending into the upper medial right arm, there is a firm nodular subcutaneous cord. No ecchymosis or skin change over this area.    Assessment & Plan Imogene Burn. Berdine Rasmusson MD; 10/25/2015 12:04 PM) MASS OF RIGHT AXILLA (R22.31) Current Plans Excision of right axillary mass.  The surgical procedure has been discussed with the patient.  Potential risks, benefits, alternative treatments, and expected outcomes have been explained.  All of the patient's questions at this time have been answered.  The likelihood of reaching the patient's treatment goal is good.  The patient understand the proposed surgical procedure and wishes to proceed. Imogene Burn. Georgette Dover, MD, [facility]  General/ Trauma Surgery  11/05/2015 5:09 PM

## 2015-11-11 DIAGNOSIS — M797 Fibromyalgia: Secondary | ICD-10-CM | POA: Diagnosis not present

## 2015-11-16 ENCOUNTER — Other Ambulatory Visit: Payer: Self-pay | Admitting: Family Medicine

## 2015-11-16 NOTE — Telephone Encounter (Signed)
Pt was seen recently with vickie for allergies

## 2015-12-02 ENCOUNTER — Other Ambulatory Visit: Payer: Self-pay | Admitting: Surgery

## 2015-12-02 DIAGNOSIS — L929 Granulomatous disorder of the skin and subcutaneous tissue, unspecified: Secondary | ICD-10-CM | POA: Diagnosis not present

## 2015-12-02 DIAGNOSIS — L732 Hidradenitis suppurativa: Secondary | ICD-10-CM | POA: Diagnosis not present

## 2015-12-02 DIAGNOSIS — L089 Local infection of the skin and subcutaneous tissue, unspecified: Secondary | ICD-10-CM | POA: Diagnosis not present

## 2015-12-02 DIAGNOSIS — D235 Other benign neoplasm of skin of trunk: Secondary | ICD-10-CM | POA: Diagnosis not present

## 2015-12-09 DIAGNOSIS — R300 Dysuria: Secondary | ICD-10-CM | POA: Diagnosis not present

## 2015-12-09 DIAGNOSIS — R3982 Chronic bladder pain: Secondary | ICD-10-CM | POA: Diagnosis not present

## 2015-12-13 ENCOUNTER — Ambulatory Visit (INDEPENDENT_AMBULATORY_CARE_PROVIDER_SITE_OTHER): Payer: Medicare Other | Admitting: Family Medicine

## 2015-12-13 VITALS — BP 100/64 | HR 71 | Wt 156.0 lb

## 2015-12-13 DIAGNOSIS — B370 Candidal stomatitis: Secondary | ICD-10-CM

## 2015-12-13 DIAGNOSIS — B3781 Candidal esophagitis: Secondary | ICD-10-CM | POA: Diagnosis not present

## 2015-12-13 DIAGNOSIS — R338 Other retention of urine: Secondary | ICD-10-CM | POA: Diagnosis not present

## 2015-12-13 MED ORDER — FLUCONAZOLE 100 MG PO TABS: 100.0000 mg | ORAL_TABLET | Freq: Every day | ORAL | 0 refills | Status: DC

## 2015-12-13 MED ORDER — LIDOCAINE VISCOUS 2 % MT SOLN: OROMUCOSAL | 0 refills | Status: DC

## 2015-12-13 NOTE — Progress Notes (Signed)
   Subjective:    Patient ID: Gina Espinoza, female    DOB: 03-04-1961, 55 y.o.   MRN: [id number]  HPI she states that since she had surgery on a lesion in her axilla, she has had difficulty with swallowing food with some anorexia and nausea. Apparently there is also difficulty with bladder function and she now has a catheter present.  Review of Systems     Objective:   Physical Exam Alert and looking uncomfortable. Mouth is slightly erythematous. No actual lesions were seen but KOH was positive.       Assessment & Plan:  Thrush of mouth and esophagus ([place]) - Plan: fluconazole (DIFLUCAN) 100 MG tablet, lidocaine (XYLOCAINE) 2 % solution Will have her use the Diflucan for 1 week and use of Xylocaine prior to eating. She will call if further difficulty.

## 2015-12-13 NOTE — Patient Instructions (Signed)
Use Xylocaine prior to eating.

## 2015-12-14 DIAGNOSIS — R300 Dysuria: Secondary | ICD-10-CM | POA: Diagnosis not present

## 2015-12-14 DIAGNOSIS — R338 Other retention of urine: Secondary | ICD-10-CM | POA: Diagnosis not present

## 2015-12-16 DIAGNOSIS — M797 Fibromyalgia: Secondary | ICD-10-CM | POA: Diagnosis not present

## 2015-12-21 DIAGNOSIS — Z6825 Body mass index (BMI) 25.0-25.9, adult: Secondary | ICD-10-CM | POA: Diagnosis not present

## 2015-12-21 DIAGNOSIS — Z779 Other contact with and (suspected) exposures hazardous to health: Secondary | ICD-10-CM | POA: Diagnosis not present

## 2015-12-21 DIAGNOSIS — Z1231 Encounter for screening mammogram for malignant neoplasm of breast: Secondary | ICD-10-CM | POA: Diagnosis not present

## 2015-12-21 DIAGNOSIS — Z124 Encounter for screening for malignant neoplasm of cervix: Secondary | ICD-10-CM | POA: Diagnosis not present

## 2015-12-29 ENCOUNTER — Ambulatory Visit (INDEPENDENT_AMBULATORY_CARE_PROVIDER_SITE_OTHER): Payer: Medicare Other | Admitting: Gastroenterology

## 2015-12-29 ENCOUNTER — Encounter: Payer: Self-pay | Admitting: Gastroenterology

## 2015-12-29 VITALS — BP 100/62 | HR 68 | Ht 66.0 in | Wt 160.2 lb

## 2015-12-29 DIAGNOSIS — K5900 Constipation, unspecified: Secondary | ICD-10-CM

## 2015-12-29 MED ORDER — LINACLOTIDE 290 MCG PO CAPS: 290.0000 ug | ORAL_CAPSULE | Freq: Every day | ORAL | 11 refills | Status: DC

## 2015-12-29 NOTE — Patient Instructions (Signed)
Refills for linzess 247mcg pills, one pill once daily, 30 days with 11 refills. Will refill again in 1 year but if you still need it in 2 years you will need follow up.

## 2015-12-29 NOTE — Progress Notes (Signed)
Review of pertinent gastrointestinal problems:  1. Post prandial abd pain, weight loss (hospitalized) Ardis Hughs EGD: 9/2014There was a moderate amount of retained solid and liquid gastric contents without anatomic obstruction. There was mild to moderate distal gastritis which was biopsied to check for H. pylori. The examination was otherwise normal. Path showed no h. Pylori  2. EGD, colonoscopy Dr. Collene Mares.  Colonoscopy Dr. Collene Mares, 02/2007; for guiac Pos stools; "limited by poor prep...large amount of stool in the colon." Findings normal examination to the terminal ileum. Recommended repeat colonoscopy in 10 years. EGD Dr. Collene Mares, 02/2007; for dysphagia, findings, normal examination.  I recommend colonoscopy sooner (2014) given the "poor prep" described by Dr. Collene Mares.   3. Constipation: improved with linzess, multiple chronic narcotics likely contribute to her constipation 4. Adenomatous polyp:  Colonoscopy Dr. Ardis Hughs 04/2013 (one sub CM adenoma removed, recommended recall at 5 years.).  HPI: This is a  very pleasant 55 year old woman whom I last saw about 3 years ago  Chief complaint is constipation  Still battles with constipation, related to multiple narcotics that she takes every day. Linzess is very helpful. miralax isn't good enough alone.  No overt bleeding.  Overall her weight fluctuates.     Past Medical History:  Diagnosis Date  . Anxiety   . Chest pain, unspecified   . Cough   . Depression   . Fibromyalgia   . Headache(784.0)   . Other and unspecified hyperlipidemia    mixed  . Personal history of unspecified circulatory disease   . Unspecified essential hypertension     Past Surgical History:  Procedure Laterality Date  . anterior perineoplasty     posterior repair. 05/16/02  . BLADDER SUSPENSION    . CARDIAC CATHETERIZATION  2006   normal coronaries  . ESOPHAGOGASTRODUODENOSCOPY N/A 02/09/2013   Procedure: ESOPHAGOGASTRODUODENOSCOPY (EGD);  Surgeon: Milus Banister, MD;   Location: [facility];  Service: Endoscopy;  Laterality: N/A;  . excision sebaceous cyst  11/2013   posterior scalp  . HEMATOMA EVACUATION     vaginal hematoma 05/23/02    Current Outpatient Prescriptions  Medication Sig Dispense Refill  . ALPRAZolam (XANAX) 1 MG tablet TAKE 1 TABLET EVERY 8 HOURS AS NEEDED FOR ANXIETY  3  . amLODipine (NORVASC) 2.5 MG tablet TAKE 1 TABLET (2.5 MG TOTAL) BY MOUTH DAILY. 90 tablet 3  . azelastine (ASTELIN) 0.1 % nasal spray PLACE 2 SPRAYS INTO BOTH NOSTRILS 2 (TWO) TIMES DAILY. USE IN EACH NOSTRIL AS DIRECTED 6 mL 11  . bethanechol (URECHOLINE) 10 MG tablet Take 10 mg by mouth 3 (three) times daily as needed (bladder spasms).    . cetirizine (ZYRTEC) 10 MG tablet Take 1 tablet (10 mg total) by mouth daily. 30 tablet 11  . diazepam (VALIUM) 5 MG tablet Take 5 mg by mouth every 6 (six) hours as needed for anxiety (1-2 tabs at a time).    Marland Kitchen EPINEPHrine 0.3 mg/0.3 mL IJ SOAJ injection Inject 0.3 mLs (0.3 mg total) into the muscle once. 1 Device 0  . escitalopram (LEXAPRO) 20 MG tablet Take 20 mg by mouth daily.    . fentaNYL (DURAGESIC - DOSED MCG/HR) 50 MCG/HR Place 50 mcg onto the skin every 3 (three) days.    . fluticasone (FLONASE) 50 MCG/ACT nasal spray PLACE 2 SPRAYS INTO BOTH NOSTRILS DAILY. 16 g 2  . HYDROmorphone (DILAUDID) 2 MG tablet Take 1 tablet (2 mg total) by mouth every 4 (four) hours as needed for pain. 10 tablet 0  .  lidocaine (LIDODERM) 5 % Place 1 patch onto the skin daily. Remove & Discard patch within 12 hours or as directed by MD//3 patches at a time, 12 hours on and 12 hours off     . lidocaine (XYLOCAINE) 2 % solution Use a small amount in the mouth prior to eating. 100 mL 0  . Linaclotide (LINZESS) 290 MCG CAPS capsule Take 1 capsule (290 mcg total) by mouth daily. 30 capsule 0  . metaxalone (SKELAXIN) 800 MG tablet Take 800 mg by mouth 2 (two) times daily as needed (muscle spasms).     . montelukast (SINGULAIR) 10 MG tablet Take 1 tablet  (10 mg total) by mouth at bedtime. (Patient not taking: Reported on 12/13/2015) 30 tablet 3  . morphine (MS CONTIN) 15 MG 12 hr tablet Take 1 tablet (15 mg total) by mouth 2 (two) times daily as needed for pain. 10 tablet 0  . nitroGLYCERIN (NITROSTAT) 0.4 MG SL tablet Place 1 tablet (0.4 mg total) under the tongue every 5 (five) minutes as needed. if pain persist call 911 25 tablet 3  . olopatadine (PATANOL) 0.1 % ophthalmic solution Place 1 drop into both eyes 2 (two) times daily as needed for allergies.     . pantoprazole (PROTONIX) 40 MG tablet Take 1 tablet (40 mg total) by mouth daily at 6 (six) AM. (Patient not taking: Reported on 12/13/2015) 90 tablet 3  . polyethylene glycol (MIRALAX / GLYCOLAX) packet Take 17 g by mouth 2 (two) times daily. (Patient taking differently: Take 17 g by mouth daily as needed for mild constipation. ) 14 each 0  . rosuvastatin (CRESTOR) 5 MG tablet TAKE 1 TABLET (5 MG TOTAL) BY MOUTH AT BEDTIME. 90 tablet 3  . tamsulosin (FLOMAX) 0.4 MG CAPS capsule Take 0.4 mg by mouth daily.  3  . tiZANidine (ZANAFLEX) 2 MG tablet TAKE (1) TABLET BY MOUTH EVERY 6 TO 8 HOURS AS NEEDED**MAXIMUM OF 3 DOSES IN 24 HOURS**  0  . triamterene-hydrochlorothiazide (MAXZIDE) 75-50 MG tablet TAKE 1 TABLET BY MOUTH EVERY DAY AS NEEDED FOR FLUID RETENTION  12  . valACYclovir (VALTREX) 500 MG tablet Take 500 mg by mouth daily as needed (infection).     Marland Kitchen zolpidem (AMBIEN) 10 MG tablet Take 1 tablet (10 mg total) by mouth every other day. Alternates with temazepam 10 tablet 0   No current facility-administered medications for this visit.     Allergies as of 12/29/2015 - Review Complete 12/29/2015  Allergen Reaction Noted  . Savella [milnacipran hcl] Anaphylaxis 10/14/2011  . Bee venom Swelling   . Codeine Hives and Itching 11/05/2008  . Ivp dye [iodinated diagnostic agents] Hives and Itching 10/10/2011    Family History  Problem Relation Age of Onset  . Allergies Mother     also  children  . Asthma Daughter   . Heart disease Father   . Breast cancer Maternal Grandmother   . Colon cancer Brother   . Asthma Grandchild     Social History   Social History  . Marital status: Single    Spouse name: N/A  . Number of children: 3  . Years of education: N/A   Occupational History  . disabled    Social History Main Topics  . Smoking status: Never Smoker  . Smokeless tobacco: Never Used  . Alcohol use No  . Drug use: No  . Sexual activity: Not Currently   Other Topics Concern  . Not on file   Social History Narrative  Single, children, works as a Recruitment consultant.  On disability (fibromyalgia, heart, depression/anxiety)     Physical Exam: BP 100/62   Pulse 68   Ht 5\' 6"  (1.676 m)   Wt 160 lb 3.2 oz (72.7 kg)   BMI 25.86 kg/m  Constitutional: generally well-appearing Psychiatric: alert and oriented x3 Abdomen: soft, nontender, nondistended, no obvious ascites, no peritoneal signs, normal bowel sounds   Assessment and plan: 55 y.o. female with Narcotic induced constipation, chronic  Happy to refill her Linzess today. She knows that if she is still doing well and requires this in one year from now I will refill it without need for office visit but if she still requires prescription strength medicine in 2 years from now prefer her to come back to the office so that we can see how she is doing, check for need for other GI testing workup at that point.   Owens Loffler, MD [facility] 12/29/2015, 2:36 PM

## 2016-01-05 ENCOUNTER — Ambulatory Visit (INDEPENDENT_AMBULATORY_CARE_PROVIDER_SITE_OTHER): Payer: Medicare Other | Admitting: Family Medicine

## 2016-01-05 ENCOUNTER — Encounter: Payer: Self-pay | Admitting: Family Medicine

## 2016-01-05 VITALS — BP 108/78 | HR 70 | Wt 161.0 lb

## 2016-01-05 DIAGNOSIS — I808 Phlebitis and thrombophlebitis of other sites: Secondary | ICD-10-CM

## 2016-01-05 NOTE — Patient Instructions (Signed)
Heat for 20 minutes 3 times per day. 2 Aleve twice per day. You this for 2 or 3 weeks

## 2016-01-05 NOTE — Progress Notes (Signed)
   Subjective:    Patient ID: Gina Espinoza, female    DOB: 12/24/1960, 55 y.o.   MRN: [id number]  HPI On July 13 she had surgery on her right axilla to remove a benign cystic lesion. She was seen again on August 9. She complained at that time of numbness in the shoulder area as well as pain on the medial aspect of it. Apparently she was given exercises. It was very difficult to get a good history from her but she then stated that she has had a rope like lesion to the medial aspect of her arm into the axilla but this did not start causing discomfort until a proximally for 5 days ago when she also noted some axillary swelling in the area of her previous surgery. She states she's had numbness in the deltoid area and was told that this was possibly secondary to nerve damage and would take a year or so to get better.   Review of Systems     Objective:   Physical Exam Alert and in no distress. She complains of numbness to the deltoid area however motor is normal in the shoulder. Exam of the right axilla does show previous surgery with some slight swelling and tenderness at the incision site but no erythema or warmth. Distal to that she does have a rope like tender lesion extending almost to the elbow. It is slightly knotty in texture.       Assessment & Plan:  Superficial phlebitis of arm I explained that this is a inflammation of the vessel. Recommended heat for 20 minutes 3 times per day as well as 2 Aleve twice per day and to do this for 2 or 3 weeks. I then discussed the possibility of the numbness being from nerve damage and the fact that it can take as long as year to get better. Explained that if it does not get better she will need to be careful to visually inspect the deltoid area of her shoulder to ensure no damage.

## 2016-01-20 DIAGNOSIS — M797 Fibromyalgia: Secondary | ICD-10-CM | POA: Diagnosis not present

## 2016-02-03 ENCOUNTER — Telehealth: Payer: Self-pay | Admitting: Internal Medicine

## 2016-02-03 MED ORDER — FLUTICASONE PROPIONATE 50 MCG/ACT NA SUSP: NASAL | 0 refills | Status: DC

## 2016-02-03 NOTE — Telephone Encounter (Signed)
Done

## 2016-02-03 NOTE — Telephone Encounter (Signed)
Refill request for fluticasone 50 mcg 90 days to [facility]

## 2016-02-09 ENCOUNTER — Other Ambulatory Visit: Payer: Self-pay | Admitting: Family Medicine

## 2016-02-09 DIAGNOSIS — J302 Other seasonal allergic rhinitis: Secondary | ICD-10-CM

## 2016-02-16 DIAGNOSIS — M797 Fibromyalgia: Secondary | ICD-10-CM | POA: Diagnosis not present

## 2016-02-22 ENCOUNTER — Ambulatory Visit (HOSPITAL_COMMUNITY)
Admission: EM | Admit: 2016-02-22 | Discharge: 2016-02-22 | Disposition: A | Discharge status: Home / Self Care | Payer: Medicare Other | Attending: Family Medicine | Admitting: Family Medicine

## 2016-02-22 ENCOUNTER — Encounter (HOSPITAL_COMMUNITY): Payer: Self-pay | Admitting: Family Medicine

## 2016-02-22 DIAGNOSIS — M79601 Pain in right arm: Secondary | ICD-10-CM | POA: Diagnosis not present

## 2016-02-22 MED ORDER — KETOROLAC TROMETHAMINE 30 MG/ML IJ SOLN
INTRAMUSCULAR | Status: AC
  Filled 2016-02-22: qty 1

## 2016-02-22 MED ORDER — KETOROLAC TROMETHAMINE 30 MG/ML IJ SOLN
30.0000 mg | Freq: Once | INTRAMUSCULAR | Status: AC
  Administered 2016-02-22: 30 mg via INTRAMUSCULAR
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
## 2016-02-24 ENCOUNTER — Ambulatory Visit (INDEPENDENT_AMBULATORY_CARE_PROVIDER_SITE_OTHER): Payer: Medicare Other | Admitting: Family Medicine

## 2016-02-24 ENCOUNTER — Encounter: Payer: Self-pay | Admitting: Family Medicine

## 2016-02-24 VITALS — BP 120/62 | HR 60 | Wt 163.0 lb

## 2016-02-24 DIAGNOSIS — Z23 Encounter for immunization: Secondary | ICD-10-CM

## 2016-02-24 DIAGNOSIS — T754XXA Electrocution, initial encounter: Secondary | ICD-10-CM
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
## 2016-03-15 DIAGNOSIS — M797 Fibromyalgia: Secondary | ICD-10-CM | POA: Diagnosis not present

## 2016-03-20 ENCOUNTER — Other Ambulatory Visit: Payer: Self-pay

## 2016-03-20 ENCOUNTER — Telehealth: Payer: Self-pay | Admitting: Family Medicine

## 2016-03-20 MED ORDER — MONTELUKAST SODIUM 10 MG PO TABS: 10.0000 mg | ORAL_TABLET | Freq: Every day | ORAL | 3 refills | Status: DC

## 2016-03-20 NOTE — Telephone Encounter (Signed)
Pharmacy called for the pt and is stating that the pt is requesting a 90 day supply for her Singulair pt uses [id number] - [place], [place] [address]

## 2016-03-20 NOTE — Telephone Encounter (Signed)
Sent in 90 day supply of singular

## 2016-03-20 NOTE — Telephone Encounter (Signed)
done

## 2016-04-22 ENCOUNTER — Other Ambulatory Visit: Payer: Self-pay | Admitting: Family Medicine

## 2016-04-22 DIAGNOSIS — E785 Hyperlipidemia, unspecified: Secondary | ICD-10-CM

## 2016-04-22 DIAGNOSIS — J309 Allergic rhinitis, unspecified: Secondary | ICD-10-CM

## 2016-05-06 ENCOUNTER — Other Ambulatory Visit: Payer: Self-pay | Admitting: Family Medicine

## 2016-05-12 DIAGNOSIS — K602 Anal fissure, unspecified: Secondary | ICD-10-CM | POA: Diagnosis not present

## 2016-06-07 ENCOUNTER — Other Ambulatory Visit: Payer: Self-pay | Admitting: Family Medicine

## 2016-06-14 DIAGNOSIS — Z780 Asymptomatic menopausal state: Secondary | ICD-10-CM | POA: Diagnosis not present

## 2016-06-14 DIAGNOSIS — M797 Fibromyalgia: Secondary | ICD-10-CM | POA: Diagnosis not present

## 2016-07-01 ENCOUNTER — Other Ambulatory Visit: Payer: Self-pay | Admitting: Family Medicine

## 2016-07-01 DIAGNOSIS — I10 Essential (primary) hypertension: Secondary | ICD-10-CM

## 2016-07-03 ENCOUNTER — Other Ambulatory Visit: Payer: Self-pay | Admitting: Family Medicine

## 2016-07-03 NOTE — Telephone Encounter (Signed)
I this okay to refill 

## 2016-07-13 DIAGNOSIS — M797 Fibromyalgia: Secondary | ICD-10-CM | POA: Diagnosis not present

## 2016-08-10 DIAGNOSIS — M797 Fibromyalgia: Secondary | ICD-10-CM | POA: Diagnosis not present

## 2016-08-25 ENCOUNTER — Ambulatory Visit (INDEPENDENT_AMBULATORY_CARE_PROVIDER_SITE_OTHER): Payer: Medicare Other | Admitting: Family Medicine

## 2016-08-25 ENCOUNTER — Encounter: Payer: Self-pay | Admitting: Family Medicine

## 2016-08-25 VITALS — BP 120/82 | HR 60 | Wt 170.0 lb

## 2016-08-25 DIAGNOSIS — K219 Gastro-esophageal reflux disease without esophagitis: Secondary | ICD-10-CM | POA: Diagnosis not present

## 2016-08-25 DIAGNOSIS — I10 Essential (primary) hypertension: Secondary | ICD-10-CM | POA: Diagnosis not present

## 2016-08-25 DIAGNOSIS — J40 Bronchitis, not specified as acute or chronic: Secondary | ICD-10-CM

## 2016-08-25 DIAGNOSIS — K140 Glossitis: Secondary | ICD-10-CM | POA: Diagnosis not present

## 2016-08-25 DIAGNOSIS — Z1159 Encounter for screening for other viral diseases: Secondary | ICD-10-CM

## 2016-08-25 DIAGNOSIS — Z79899 Other long term (current) drug therapy: Secondary | ICD-10-CM

## 2016-08-25 DIAGNOSIS — J302 Other seasonal allergic rhinitis: Secondary | ICD-10-CM

## 2016-08-25 DIAGNOSIS — E785 Hyperlipidemia, unspecified: Secondary | ICD-10-CM

## 2016-08-25 LAB — CBC WITH DIFFERENTIAL/PLATELET
BASOS PCT: 0 %
Basophils Absolute: 0 cells/uL (ref 0–200)
Eosinophils Absolute: 59 cells/uL (ref 15–500)
Eosinophils Relative: 1 %
HEMATOCRIT: 37 % (ref 35.0–45.0)
Hemoglobin: 11.9 g/dL (ref 11.7–15.5)
Lymphocytes Relative: 35 %
Lymphs Abs: 2065 cells/uL (ref 850–3900)
MCH: 25.2 pg — ABNORMAL LOW (ref 27.0–33.0)
MCHC: 32.2 g/dL (ref 32.0–36.0)
MCV: 78.2 fL — AB (ref 80.0–100.0)
MONO ABS: 354 {cells}/uL (ref 200–950)
MPV: 8.5 fL (ref 7.5–12.5)
Monocytes Relative: 6 %
NEUTROS ABS: 3422 {cells}/uL (ref 1500–7800)
Neutrophils Relative %: 58 %
Platelets: 296 10*3/uL (ref 140–400)
RBC: 4.73 MIL/uL (ref 3.80–5.10)
RDW: 15.7 % — ABNORMAL HIGH (ref 11.0–15.0)
WBC: 5.9 10*3/uL (ref 4.0–10.5)

## 2016-08-25 LAB — COMPREHENSIVE METABOLIC PANEL
ALBUMIN: 4.1 g/dL (ref 3.6–5.1)
ALT: 11 U/L (ref 6–29)
AST: 18 U/L (ref 10–35)
Alkaline Phosphatase: 121 U/L (ref 33–130)
BUN: 10 mg/dL (ref 7–25)
CALCIUM: 9.3 mg/dL (ref 8.6–10.4)
CO2: 30 mmol/L (ref 20–31)
Chloride: 104 mmol/L (ref 98–110)
Creat: 0.87 mg/dL (ref 0.50–1.05)
Glucose, Bld: 97 mg/dL (ref 65–99)
POTASSIUM: 4.4 mmol/L (ref 3.5–5.3)
Sodium: 141 mmol/L (ref 135–146)
TOTAL PROTEIN: 7.5 g/dL (ref 6.1–8.1)
Total Bilirubin: 0.5 mg/dL (ref 0.2–1.2)

## 2016-08-25 LAB — LIPID PANEL
CHOLESTEROL: 166 mg/dL (ref <200)
HDL: 55 mg/dL (ref >50)
LDL Cholesterol: 93 mg/dL (ref <100)
TRIGLYCERIDES: 88 mg/dL (ref <150)
Total CHOL/HDL Ratio: 3 Ratio (ref <5.0)
VLDL: 18 mg/dL (ref <30)

## 2016-08-25 MED ORDER — AMOXICILLIN-POT CLAVULANATE 875-125 MG PO TABS: 1.0000 | ORAL_TABLET | Freq: Two times a day (BID) | ORAL | 0 refills | Status: DC

## 2016-08-25 MED ORDER — MONTELUKAST SODIUM 10 MG PO TABS: 10.0000 mg | ORAL_TABLET | Freq: Every day | ORAL | 3 refills | Status: DC

## 2016-08-25 MED ORDER — AZELASTINE HCL 0.1 % NA SOLN: NASAL | 11 refills | Status: DC

## 2016-08-25 MED ORDER — ROSUVASTATIN CALCIUM 5 MG PO TABS: ORAL_TABLET | ORAL | 3 refills | Status: DC

## 2016-08-25 MED ORDER — LEVOCETIRIZINE DIHYDROCHLORIDE 5 MG PO TABS: ORAL_TABLET | ORAL | 3 refills | Status: DC

## 2016-08-25 MED ORDER — AMLODIPINE BESYLATE 2.5 MG PO TABS: ORAL_TABLET | ORAL | 3 refills | Status: DC

## 2016-08-25 MED ORDER — PANTOPRAZOLE SODIUM 40 MG PO TBEC: 40.0000 mg | DELAYED_RELEASE_TABLET | Freq: Every day | ORAL | 3 refills | Status: DC

## 2016-08-25 NOTE — Progress Notes (Signed)
   Subjective:    Patient ID: Gina Espinoza, female    DOB: 09/23/1960, 56 y.o.   MRN: [id number]  HPI She is here for medication check visit. She was recently seen by her gynecologist and treated with Diflucan and azithromycin for cough as well as complaints of white lesions in her mouth and tongue discomfort. She states that the whitish lesions are gone but she still having difficulty with a cough. No fever, chills, earache or true sore throat. Mainly complains of burning sensation and dry cough as mentioned before. She also continues on Crestor and having no difficulty with that medication. She does taken a nitroglycerin once or twice per year. Continues on Norvasc for her hypertension. She also is taking Xyzal and Singulair for her underlying allergies. Her reflux is under good control with Protonix.. She is also on an unknown medication for treatment of bladder related issues. She did not bring her medications in with her.   Review of Systems     Objective:   Physical Exam Alert and in no distress. Exam of her mouth shows a normal tongue and no other lesions noted. Tympanic membranes and canals are normal. Pharyngeal area is normal. Neck is supple without adenopathy or thyromegaly. Cardiac exam shows a regular sinus rhythm without murmurs or gallops. Lungs are clear to auscultation.       Assessment & Plan:  Bronchitis - Plan: amoxicillin-clavulanate (AUGMENTIN) 875-125 MG tablet  Hyperlipidemia LDL goal <100 - Plan: rosuvastatin (CRESTOR) 5 MG tablet, Lipid panel  Essential hypertension - Plan: amLODipine (NORVASC) 2.5 MG tablet, CBC with Differential/Platelet, Comprehensive metabolic panel  Glossitis  Chronic seasonal allergic rhinitis, unspecified trigger - Plan: montelukast (SINGULAIR) 10 MG tablet, levocetirizine (XYZAL) 5 MG tablet, azelastine (ASTELIN) 0.1 % nasal spray, CBC with Differential/Platelet, Comprehensive metabolic panel  Gastroesophageal reflux disease without  esophagitis - Plan: pantoprazole (PROTONIX) 40 MG tablet  Encounter for long-term (current) use of medications - Plan: CBC with Differential/Platelet, Comprehensive metabolic panel, Hepatitis C antibody, Lipid panel  Need for hepatitis C screening test - Plan: Hepatitis C antibody Difficult to say what exactly is causing her cough but I will treat her with Augmentin. She will keep me informed as to the benefit. Also recommend half water and peroxide for her tongue. Explained that I did not see any lesions in the mouth. She will continue on Crestor as well as Norvasc. Continue on her allergy medications. Protonix was also renewed. Encouraged her to always bring in all of her medications. Will get the notes from her gynecologist find out exactly what was be treated.

## 2016-08-25 NOTE — Patient Instructions (Signed)
Gargle with half water half peroxide several times per day and then spit it out

## 2016-08-26 LAB — HEPATITIS C ANTIBODY: HCV Ab: NEGATIVE

## 2016-08-28 ENCOUNTER — Telehealth: Payer: Self-pay

## 2016-08-28 NOTE — Telephone Encounter (Signed)
Records from [place] on 08/10/16 at [facility] placed in your folder for review. Victorino December

## 2016-09-13 DIAGNOSIS — M797 Fibromyalgia: Secondary | ICD-10-CM | POA: Diagnosis not present

## 2016-10-12 DIAGNOSIS — R5383 Other fatigue: Secondary | ICD-10-CM | POA: Diagnosis not present

## 2016-11-14 DIAGNOSIS — M797 Fibromyalgia: Secondary | ICD-10-CM | POA: Diagnosis not present

## 2016-12-12 DIAGNOSIS — N951 Menopausal and female climacteric states: Secondary | ICD-10-CM | POA: Diagnosis not present

## 2017-01-18 DIAGNOSIS — Z6826 Body mass index (BMI) 26.0-26.9, adult: Secondary | ICD-10-CM | POA: Diagnosis not present

## 2017-01-18 DIAGNOSIS — Z1231 Encounter for screening mammogram for malignant neoplasm of breast: Secondary | ICD-10-CM | POA: Diagnosis not present

## 2017-01-18 DIAGNOSIS — Z124 Encounter for screening for malignant neoplasm of cervix: Secondary | ICD-10-CM | POA: Diagnosis not present

## 2017-01-18 DIAGNOSIS — Z779 Other contact with and (suspected) exposures hazardous to health: Secondary | ICD-10-CM | POA: Diagnosis not present

## 2017-01-19 LAB — HM PAP SMEAR: HM PAP: NORMAL

## 2017-01-19 LAB — HM MAMMOGRAPHY

## 2017-02-15 DIAGNOSIS — M797 Fibromyalgia: Secondary | ICD-10-CM | POA: Diagnosis not present

## 2017-02-27 ENCOUNTER — Other Ambulatory Visit: Payer: Self-pay | Admitting: Gastroenterology

## 2017-02-27 MED ORDER — LINACLOTIDE 290 MCG PO CAPS: 290.0000 ug | ORAL_CAPSULE | Freq: Every day | ORAL | 1 refills | Status: DC

## 2017-02-27 NOTE — Telephone Encounter (Signed)
rx sent Patient notified 

## 2017-03-14 DIAGNOSIS — Z23 Encounter for immunization: Secondary | ICD-10-CM | POA: Diagnosis not present

## 2017-03-14 DIAGNOSIS — M797 Fibromyalgia: Secondary | ICD-10-CM | POA: Diagnosis not present

## 2017-05-07 ENCOUNTER — Encounter: Payer: Self-pay | Admitting: Gastroenterology

## 2017-05-07 ENCOUNTER — Ambulatory Visit (INDEPENDENT_AMBULATORY_CARE_PROVIDER_SITE_OTHER): Payer: Medicare Other | Admitting: Gastroenterology

## 2017-05-07 VITALS — BP 112/64 | HR 66 | Ht 66.0 in | Wt 169.4 lb

## 2017-05-07 DIAGNOSIS — Z86010 Personal history of colonic polyps: Secondary | ICD-10-CM

## 2017-05-07 DIAGNOSIS — K625 Hemorrhage of anus and rectum: Secondary | ICD-10-CM | POA: Diagnosis not present

## 2017-05-07 DIAGNOSIS — R140 Abdominal distension (gaseous): Secondary | ICD-10-CM

## 2017-05-07 MED ORDER — LINACLOTIDE 290 MCG PO CAPS: 290.0000 ug | ORAL_CAPSULE | Freq: Every day | ORAL | 11 refills | Status: DC

## 2017-05-07 MED ORDER — NA SULFATE-K SULFATE-MG SULF 17.5-3.13-1.6 GM/177ML PO SOLN
1.0000 | Freq: Once | ORAL | 0 refills | Status: AC
Start: 2017-05-07 — End: 2017-05-07

## 2017-05-07 NOTE — Patient Instructions (Addendum)
Refills on linzess 240mcg, one pill once daily, 30 days, 11 refills. Also continue your miralax daily.  You will be set up for a colonoscopy for rectal bleeding, h/o adenomatous polyps.  You will be set up for an upper endoscopy for bloating.  Normal BMI (Body Mass Index- based on height and weight) is between 19 and 25. Your BMI today is Body mass index is 27.34 kg/m. Marland Kitchen Please consider follow up  regarding your BMI with your Primary Care Provider.

## 2017-05-07 NOTE — Progress Notes (Signed)
Review of pertinent gastrointestinal problems:  1. Post prandial abd pain, weight loss (hospitalized) Ardis Hughs EGD: 9/2014There was a moderate amount of retained solid and liquid gastric contents without anatomic obstruction. There was mild to moderate distal gastritis which was biopsied to check for H. pylori. The examination was otherwise normal. Path showed no h. Pylori. GES 02/2013 was normal. 2. EGD, colonoscopy Dr. Collene Mares. Colonoscopy Dr. Collene Mares, 02/2007; for guiac Pos stools; "limited by poor prep...large amount of stool in the colon." Findings normal examination to the terminal ileum. Recommended repeat colonoscopy in 10 years. EGD Dr. Collene Mares, 02/2007; for dysphagia, findings, normal examination. I recommend colonoscopy sooner (2014) given the "poor prep" described by Dr. Collene Mares.   3. Constipation: improved with linzess, multiple chronic narcotics likely contribute to her constipation 4. Adenomatous polyp:  Colonoscopy Dr. Ardis Hughs 04/2013 (one sub CM adenoma removed, recommended recall at 5 years.).   HPI: This is a very pleasant 56 year old woman whom I last saw about a year ago.  Taking linzess 290 per day. Also 1 dose of miralax daily. On that regimen she has BM every other day.   She would go weeks without a BM if she didn't take the meds.  Has a lot of gas, bloating. She has tried OTC regimens.  Bloating is not related to moving her bowels. A lot of odorless gas.  Can take days to weeks to imrove the bloating, gas.   Says her weight fluctuates 10-15 pound range.  She has bleeding with BMs, describes what sound like a rectal prolapse.    She says a ball will protrude from her backside when she has a bowel movement.  It can get very irritated and bleed as well.  I last saw her here in the office a little over a year ago.  Her weight is up 9 pounds since that visit, same scale.  Chief complaint is rectal bleeding, constipation, bloating  ROS: complete GI ROS as described in HPI, all other  review negative.  Constitutional:  No unintentional weight loss   Past Medical History:  Diagnosis Date  . Anxiety   . Chest pain, unspecified   . Cough   . Depression   . Fibromyalgia   . Headache(784.0)   . Other and unspecified hyperlipidemia    mixed  . Personal history of unspecified circulatory disease   . Unspecified essential hypertension     Past Surgical History:  Procedure Laterality Date  . anterior perineoplasty     posterior repair. 05/16/02  . BLADDER SUSPENSION    . CARDIAC CATHETERIZATION  2006   normal coronaries  . ESOPHAGOGASTRODUODENOSCOPY N/A 02/09/2013   Procedure: ESOPHAGOGASTRODUODENOSCOPY (EGD);  Surgeon: Milus Banister, MD;  Location: [facility];  Service: Endoscopy;  Laterality: N/A;  . excision sebaceous cyst  11/2013   posterior scalp  . HEMATOMA EVACUATION     vaginal hematoma 05/23/02  . shoulder surgery     sebacceous cyst removal  . UTERINE SUSPENSION      Current Outpatient Medications  Medication Sig Dispense Refill  . ALPRAZolam (XANAX) 1 MG tablet TAKE 1 TABLET EVERY 8 HOURS AS NEEDED FOR ANXIETY  3  . alprazolam (XANAX) 2 MG tablet Take 2 mg by mouth every 4 (four) hours as needed for sleep.    Marland Kitchen amLODipine (NORVASC) 2.5 MG tablet TAKE 1 TABLET (2.5 MG TOTAL) BY MOUTH DAILY. 90 tablet 3  . amoxicillin-clavulanate (AUGMENTIN) 875-125 MG tablet Take 1 tablet by mouth 2 (two) times daily. 20 tablet 0  .  azelastine (ASTELIN) 0.1 % nasal spray PLACE 2 SPRAYS INTO BOTH NOSTRILS 2 (TWO) TIMES DAILY. USE IN EACH NOSTRIL AS DIRECTED 18 mL 11  . bethanechol (URECHOLINE) 10 MG tablet Take 10 mg by mouth 3 (three) times daily as needed (bladder spasms).    . cetirizine (ZYRTEC) 10 MG tablet Take 1 tablet (10 mg total) by mouth daily. 30 tablet 11  . diazepam (VALIUM) 5 MG tablet Take 5 mg by mouth every 6 (six) hours as needed for anxiety (1-2 tabs at a time).    Marland Kitchen EPINEPHrine 0.3 mg/0.3 mL IJ SOAJ injection Inject 0.3 mLs (0.3 mg total) into  the muscle once. 1 Device 0  . erythromycin ophthalmic ointment 1 application at bedtime.    Marland Kitchen escitalopram (LEXAPRO) 20 MG tablet Take 20 mg by mouth daily.    . fentaNYL (DURAGESIC - DOSED MCG/HR) 50 MCG/HR Place 50 mcg onto the skin every 3 (three) days.    . fluconazole (DIFLUCAN) 150 MG tablet Take 150 mg by mouth daily.    . fluticasone (FLONASE) 50 MCG/ACT nasal spray PLACE 2 SPRAYS INTO BOTH NOSTRILS DAILY. 16 g 3  . HYDROcodone-acetaminophen (NORCO) 10-325 MG tablet Take 1 tablet by mouth every 6 (six) hours as needed.    Marland Kitchen HYDROmorphone (DILAUDID) 2 MG tablet Take 1 tablet (2 mg total) by mouth every 4 (four) hours as needed for pain. 10 tablet 0  . levocetirizine (XYZAL) 5 MG tablet TAKE 1 TABLET (5 MG TOTAL) BY MOUTH EVERY EVENING. 90 tablet 3  . lidocaine (LIDODERM) 5 % Place 1 patch onto the skin daily. Remove & Discard patch within 12 hours or as directed by MD//3 patches at a time, 12 hours on and 12 hours off     . lidocaine (XYLOCAINE) 2 % solution Use a small amount in the mouth prior to eating. 100 mL 0  . lidocaine (XYLOCAINE) 5 % ointment Apply 1 application topically as needed.    . metaxalone (SKELAXIN) 800 MG tablet Take 800 mg by mouth 2 (two) times daily as needed (muscle spasms).     . methocarbamol (ROBAXIN) 500 MG tablet Take 500 mg by mouth every 8 (eight) hours as needed for muscle spasms.    . mirabegron ER (MYRBETRIQ) 25 MG TB24 tablet Take 25 mg by mouth daily.    . montelukast (SINGULAIR) 10 MG tablet Take 1 tablet (10 mg total) by mouth at bedtime. 90 tablet 3  . morphine (MS CONTIN) 15 MG 12 hr tablet Take 1 tablet (15 mg total) by mouth 2 (two) times daily as needed for pain. 10 tablet 0  . nitroGLYCERIN (NITROSTAT) 0.4 MG SL tablet Place 1 tablet (0.4 mg total) under the tongue every 5 (five) minutes as needed. if pain persist call 911 25 tablet 3  . nitroGLYCERIN (NITROSTAT) 0.4 MG SL tablet PLACE 1 TAB ON TONGUE EVERY 5 MIN AS NEEDED IF PAIN PERSISTS, CALL  911 25 tablet 1  . olopatadine (PATANOL) 0.1 % ophthalmic solution Place 1 drop into both eyes 2 (two) times daily as needed for allergies.     . Oxycodone HCl 10 MG TABS Take 10 mg by mouth as needed.    . pantoprazole (PROTONIX) 40 MG tablet Take 1 tablet (40 mg total) by mouth daily at 6 (six) AM. 90 tablet 3  . polyethylene glycol (MIRALAX / GLYCOLAX) packet Take 17 g by mouth 2 (two) times daily. (Patient taking differently: Take 17 g by mouth daily. ) 14 each 0  .  promethazine (PHENERGAN) 25 MG tablet Take 25 mg by mouth every 6 (six) hours as needed for nausea or vomiting.    . rosuvastatin (CRESTOR) 5 MG tablet TAKE 1 TABLET (5 MG TOTAL) BY MOUTH AT BEDTIME. 90 tablet 3  . tamsulosin (FLOMAX) 0.4 MG CAPS capsule Take 0.4 mg by mouth daily.  3  . tiZANidine (ZANAFLEX) 2 MG tablet TAKE (1) TABLET BY MOUTH EVERY 6 TO 8 HOURS AS NEEDED**MAXIMUM OF 3 DOSES IN 24 HOURS**  0  . triamterene-hydrochlorothiazide (MAXZIDE) 75-50 MG tablet TAKE 1 TABLET BY MOUTH EVERY DAY AS NEEDED FOR FLUID RETENTION  12  . valACYclovir (VALTREX) 500 MG tablet Take 500 mg by mouth daily as needed (infection).     Marland Kitchen zolpidem (AMBIEN) 10 MG tablet Take 1 tablet (10 mg total) by mouth every other day. Alternates with temazepam 10 tablet 0  . linaclotide (LINZESS) 290 MCG CAPS capsule Take 1 capsule (290 mcg total) by mouth daily. 30 capsule 1   No current facility-administered medications for this visit.     Allergies as of 05/07/2017 - Review Complete 05/07/2017  Allergen Reaction Noted  . Savella [milnacipran hcl] Anaphylaxis 10/14/2011  . Bee venom Swelling   . Codeine Hives and Itching 11/05/2008  . Ivp dye [iodinated diagnostic agents] Hives and Itching 10/10/2011    Family History  Problem Relation Age of Onset  . Allergies Mother        also children  . Heart disease Father   . Asthma Daughter   . Breast cancer Maternal Grandmother   . Colon cancer Brother   . Asthma Grandchild     Social History    Socioeconomic History  . Marital status: Single    Spouse name: Not on file  . Number of children: 3  . Years of education: Not on file  . Highest education level: Not on file  Social Needs  . Financial resource strain: Not on file  . Food insecurity - worry: Not on file  . Food insecurity - inability: Not on file  . Transportation needs - medical: Not on file  . Transportation needs - non-medical: Not on file  Occupational History  . Occupation: disabled  Tobacco Use  . Smoking status: Never Smoker  . Smokeless tobacco: Never Used  Substance and Sexual Activity  . Alcohol use: No  . Drug use: No  . Sexual activity: Not Currently  Other Topics Concern  . Not on file  Social History Narrative   Single, children, works as a Recruitment consultant.  On disability (fibromyalgia, heart, depression/anxiety)     Physical Exam: BP 112/64   Pulse 66   Ht 5\' 6"  (1.676 m)   Wt 169 lb 6 oz (76.8 kg)   BMI 27.34 kg/m  Constitutional: generally well-appearing Psychiatric: alert and oriented x3 Abdomen: soft, nontender, nondistended, no obvious ascites, no peritoneal signs, normal bowel sounds No peripheral edema noted in lower extremities Rectal examination with female assistant in the room.  She had some small external hemorrhoids, Valsalva did not produce prolapse, rectal tone seem normal, no internal masses detected.  Stool was brown and not checked for Hemoccult  Assessment and plan: 56 y.o. female with chronic bloating, chronic constipation, intermittent rectal bleeding  First, I refilled her prescription of Linzess.  She will take 1 pill once daily and also continue taking MiraLAX on a daily basis.  She has chronic bloating and this is very bothersome to her.  Previously she had been on daily narcotic  pain medicines but she has been weaning herself as best as possible.  She only takes narcotics 2 or 3 times a week now for her fibromyalgia pains.  She did not have gastroparesis by gastric  emptying study in 1324 but certainly her symptoms resemble that now perhaps she has underlying H. pylori infection, ulcer disease.  I recommended EGD at her soonest convenience to exclude those.  By history she describes either rectal prolapse or perhaps some prolapsing internal hemorrhoids.  Valsalva today during rectal exam did not produce either of the 2 of those.  I do not detect any distal rectal masses.  She is due for polyp surveillance colonoscopy in 2019 and given her intermittent bleeding I recommended we do that for her at the same time as her upper endoscopy in the next month or 2.  If she has internal hemorrhoids noted I would probably refer her to 1 of my partners to consider hemorrhoid banding procedure.  Please see the "Patient Instructions" section for addition details about the plan.  Owens Loffler, MD [facility] 05/07/2017, 9:48 AM

## 2017-05-08 ENCOUNTER — Telehealth: Payer: Self-pay | Admitting: Gastroenterology

## 2017-05-08 NOTE — Telephone Encounter (Signed)
IV dye added to the pt allergies. She has been advised

## 2017-06-18 ENCOUNTER — Telehealth: Payer: Self-pay | Admitting: Family Medicine

## 2017-06-18 NOTE — Telephone Encounter (Signed)
Called pt to sched cpe on 08/28/17 as we have refill req.  Patient advised her other meds would be due also.  She wasn't sure names of which ones.  If any maintenance meds due, please review for refill  [facility] req refill Rosuvastatin Calcium 5 mg  #90.

## 2017-06-19 NOTE — Telephone Encounter (Signed)
Called pt pharmacy and so far she has all her medicine and is in no need for refill. Thanks [employer]

## 2017-06-27 ENCOUNTER — Other Ambulatory Visit: Payer: Self-pay

## 2017-06-27 ENCOUNTER — Encounter: Payer: Self-pay | Admitting: Gastroenterology

## 2017-06-27 ENCOUNTER — Ambulatory Visit (AMBULATORY_SURGERY_CENTER): Payer: Medicare Other | Admitting: Gastroenterology

## 2017-06-27 VITALS — BP 102/69 | HR 55 | Temp 97.1°F | Resp 15 | Ht 66.0 in | Wt 169.0 lb

## 2017-06-27 DIAGNOSIS — K649 Unspecified hemorrhoids: Secondary | ICD-10-CM | POA: Diagnosis not present

## 2017-06-27 DIAGNOSIS — K625 Hemorrhage of anus and rectum: Secondary | ICD-10-CM

## 2017-06-27 DIAGNOSIS — K5730 Diverticulosis of large intestine without perforation or abscess without bleeding: Secondary | ICD-10-CM

## 2017-06-27 DIAGNOSIS — K2990 Gastroduodenitis, unspecified, without bleeding: Secondary | ICD-10-CM | POA: Diagnosis not present

## 2017-06-27 DIAGNOSIS — K2970 Gastritis, unspecified, without bleeding: Secondary | ICD-10-CM

## 2017-06-27 DIAGNOSIS — R140 Abdominal distension (gaseous): Secondary | ICD-10-CM | POA: Diagnosis not present

## 2017-06-27 DIAGNOSIS — K2950 Unspecified chronic gastritis without bleeding: Secondary | ICD-10-CM | POA: Diagnosis not present

## 2017-06-27 DIAGNOSIS — B9681 Helicobacter pylori [H. pylori] as the cause of diseases classified elsewhere: Secondary | ICD-10-CM | POA: Diagnosis not present

## 2017-06-27 MED ORDER — SODIUM CHLORIDE 0.9 % IV SOLN: 500.0000 mL | Freq: Once | INTRAVENOUS | Status: DC

## 2017-06-27 NOTE — Patient Instructions (Signed)
YOU HAD AN ENDOSCOPIC PROCEDURE TODAY AT [facility]:   Refer to the procedure report that was given to you for any specific questions about what was found during the examination.  If the procedure report does not answer your questions, please call your gastroenterologist to clarify.  If you requested that your care partner not be given the details of your procedure findings, then the procedure report has been included in a sealed envelope for you to review at your convenience later.  YOU SHOULD EXPECT: Some feelings of bloating in the abdomen. Passage of more gas than usual.  Walking can help get rid of the air that was put into your GI tract during the procedure and reduce the bloating. If you had a lower endoscopy (such as a colonoscopy or flexible sigmoidoscopy) you may notice spotting of blood in your stool or on the toilet paper. If you underwent a bowel prep for your procedure, you may not have a normal bowel movement for a few days.  Please Note:  You might notice some irritation and congestion in your nose or some drainage.  This is from the oxygen used during your procedure.  There is no need for concern and it should clear up in a day or so.  SYMPTOMS TO REPORT IMMEDIATELY:   Following lower endoscopy (colonoscopy or flexible sigmoidoscopy):  Excessive amounts of blood in the stool  Significant tenderness or worsening of abdominal pains  Swelling of the abdomen that is new, acute  Fever of 100F or higher   Following upper endoscopy (EGD)  Vomiting of blood or coffee ground material  New chest pain or pain under the shoulder blades  Painful or persistently difficult swallowing  New shortness of breath  Fever of 100F or higher  Black, tarry-looking stools  For urgent or emergent issues, a gastroenterologist can be reached at any hour by calling [phone].   DIET:  We do recommend a small meal at first, but then you may proceed to your regular diet.  Drink  plenty of fluids but you should avoid alcoholic beverages for 24 hours.  ACTIVITY:  You should plan to take it easy for the rest of today and you should NOT DRIVE or use heavy machinery until tomorrow (because of the sedation medicines used during the test).    FOLLOW UP: Our staff will call the number listed on your records the next business day following your procedure to check on you and address any questions or concerns that you may have regarding the information given to you following your procedure. If we do not reach you, we will leave a message.  However, if you are feeling well and you are not experiencing any problems, there is no need to return our call.  We will assume that you have returned to your regular daily activities without incident.  If any biopsies were taken you will be contacted by phone or by letter within the next 1-3 weeks.  Please call us at [phone] if you have not heard about the biopsies in 3 weeks.    SIGNATURES/CONFIDENTIALITY: You and/or your care partner have signed paperwork which will be entered into your electronic medical record.  These signatures attest to the fact that that the information above on your After Visit Summary has been reviewed and is understood.  Full responsibility of the confidentiality of this discharge information lies with you and/or your care-partner.   Resume medications. Information given on Gastritis,diverticulosis hemorrhoids and hemorrhoid banding.

## 2017-06-27 NOTE — Op Note (Signed)
[facility] Patient Name: Gina Espinoza Procedure Date: 06/27/2017 11:03 AM MRN: [id number] Endoscopist: Milus Banister , MD Age: 57 Referring MD:  Date of Birth: Mar 15, 1961 Gender: Female Account #: [id number] Procedure:                Colonoscopy Indications:              Hematochezia; Adenomatous polyp: Colonoscopy Dr.                            Ardis Hughs 04/2013 (one sub CM adenoma removed,                            recommended recall at 5 years.). Medicines:                Monitored Anesthesia Care Procedure:                Pre-Anesthesia Assessment:                           - Prior to the procedure, a History and Physical                            was performed, and patient medications and                            allergies were reviewed. The patient's tolerance of                            previous anesthesia was also reviewed. The risks                            and benefits of the procedure and the sedation                            options and risks were discussed with the patient.                            All questions were answered, and informed consent                            was obtained. Prior Anticoagulants: The patient has                            taken no previous anticoagulant or antiplatelet                            agents. ASA Grade Assessment: II - A patient with                            mild systemic disease. After reviewing the risks                            and benefits, the patient was deemed in  satisfactory condition to undergo the procedure.                           After obtaining informed consent, the colonoscope                            was passed under direct vision. Throughout the                            procedure, the patient's blood pressure, pulse, and                            oxygen saturations were monitored continuously. The                            Colonoscope was introduced  through the anus and                            advanced to the the cecum, identified by                            appendiceal orifice and ileocecal valve. The                            colonoscopy was performed without difficulty. The                            patient tolerated the procedure well. The quality                            of the bowel preparation was good. The ileocecal                            valve, appendiceal orifice, and rectum were                            photographed. Scope In: 11:09:19 AM Scope Out: 11:20:38 AM Scope Withdrawal Time: 0 hours 6 minutes 11 seconds  Total Procedure Duration: 0 hours 11 minutes 19 seconds  Findings:                 A few small-mouthed diverticula were found in the                            left colon.                           Internal hemorrhoids were found. The hemorrhoids                            were medium-sized.                           The exam was otherwise without abnormality on  direct and retroflexion views. Complications:            No immediate complications. Estimated blood loss:                            None. Estimated Blood Loss:     Estimated blood loss: none. Impression:               - Diverticulosis in the left colon.                           - Internal hemorrhoids.                           - The examination was otherwise normal on direct                            and retroflexion views.                           - No polyps or cancers. Recommendation:           - Patient has a contact number available for                            emergencies. The signs and symptoms of potential                            delayed complications were discussed with the                            patient. Return to normal activities tomorrow.                            Written discharge instructions were provided to the                            patient.                           -  Resume previous diet.                           - Continue present medications.                           - Repeat colonoscopy in 10 years for screening                            purposes.                           - Referral for next available Hemorrhoidal banding                            office visit (internal hemorrhoids that bleed,  prolapse intermittently) Milus Banister, MD 06/27/2017 11:32:29 AM This report has been signed electronically.

## 2017-06-27 NOTE — Progress Notes (Signed)
Report given to PACU, vss 

## 2017-06-27 NOTE — Progress Notes (Signed)
Pt's states no medical or surgical changes since previsit or office visit. 

## 2017-06-27 NOTE — Progress Notes (Signed)
Called to room to assist during endoscopic procedure.  Patient ID and intended procedure confirmed with present staff. Received instructions for my participation in the procedure from the performing physician.  

## 2017-06-27 NOTE — Progress Notes (Signed)
Pt. Stated "I want to sleep",she informed writer that she had taken phenergan before coming for procedure. Pt. Placed in consultation room to allow more time to wake. After sitting pt. In consultation room she decided she wants to talk to Dr. Hassell Done to her that he was gone to lunch and that it will be a hour before he returns. Dr. Ardis Hughs in to speak with pt.pt. Discharge home.

## 2017-06-27 NOTE — Op Note (Signed)
[facility] Patient Name: Gina Espinoza Procedure Date: 06/27/2017 11:03 AM MRN: [id number] Endoscopist: Milus Banister , MD Age: 57 Referring MD:  Date of Birth: 1960/10/27 Gender: Female Account #: [id number] Procedure:                Upper GI endoscopy Indications:              Dyspepsia, Abdominal bloating Medicines:                Monitored Anesthesia Care Procedure:                Pre-Anesthesia Assessment:                           - Prior to the procedure, a History and Physical                            was performed, and patient medications and                            allergies were reviewed. The patient's tolerance of                            previous anesthesia was also reviewed. The risks                            and benefits of the procedure and the sedation                            options and risks were discussed with the patient.                            All questions were answered, and informed consent                            was obtained. Prior Anticoagulants: The patient has                            taken no previous anticoagulant or antiplatelet                            agents. ASA Grade Assessment: II - A patient with                            mild systemic disease. After reviewing the risks                            and benefits, the patient was deemed in                            satisfactory condition to undergo the procedure.                           After obtaining informed consent, the endoscope was  passed under direct vision. Throughout the                            procedure, the patient's blood pressure, pulse, and                            oxygen saturations were monitored continuously. The                            Endoscope was introduced through the mouth, and                            advanced to the second part of duodenum. The upper                            GI endoscopy was  accomplished without difficulty.                            The patient tolerated the procedure well. Scope In: Scope Out: Findings:                 Mild inflammation characterized by erythema was                            found in the gastric antrum. Biopsies were taken                            with a cold forceps for histology.                           The exam was otherwise without abnormality. Complications:            No immediate complications. Estimated blood loss:                            None. Estimated Blood Loss:     Estimated blood loss: none. Impression:               - Mild, non-specific gastritis. Biopsied.                           - The examination was otherwise normal. Recommendation:           - Patient has a contact number available for                            emergencies. The signs and symptoms of potential                            delayed complications were discussed with the                            patient. Return to normal activities tomorrow.                            Written discharge instructions were provided to the  patient.                           - Resume previous diet.                           - Continue present medications.                           - Await pathology results. If negative for H.                            pylori, would consider repeat gastric emptying scan                            given high suspicion for gastroparesis. Milus Banister, MD 06/27/2017 11:35:10 AM This report has been signed electronically.

## 2017-06-28 ENCOUNTER — Telehealth: Payer: Self-pay | Admitting: *Deleted

## 2017-06-28 ENCOUNTER — Telehealth: Payer: Self-pay

## 2017-06-28 NOTE — Telephone Encounter (Signed)
No answer at # given.  LM on voicemail.

## 2017-06-28 NOTE — Telephone Encounter (Signed)
Per 06/27/17 colon report pt needs appt for hemorrhoid banding

## 2017-06-28 NOTE — Telephone Encounter (Signed)
  Follow up Call-  Call back number 06/27/2017  Post procedure Call Back phone  # [phone]  Permission to leave phone message Yes  Some recent data might be hidden     Patient questions:  Do you have a fever, pain , or abdominal swelling? No. States abd pain on l side; suggested warm beverages and gas x; told to get up on all fours to pass gas; will call back if pain worsens. Now #3 Pain Score  0 *  Have you tolerated food without any problems? Yes.    Have you been able to return to your normal activities? Yes.    Do you have any questions about your discharge instructions: Diet   No. Medications  No. Follow up visit  No.  Do you have questions or concerns about your Care? Yes.    Actions: * If pain score is 4 or above: No action needed, pain <4.

## 2017-06-29 NOTE — Telephone Encounter (Signed)
The pt has been scheduled for first banding with Dr Carlean Purl on 3/19 at 3:15 pm.

## 2017-07-04 ENCOUNTER — Other Ambulatory Visit: Payer: Self-pay

## 2017-07-04 MED ORDER — BIS SUBCIT-METRONID-TETRACYC 140-125-125 MG PO CAPS: 3.0000 | ORAL_CAPSULE | Freq: Three times a day (TID) | ORAL | 0 refills | Status: DC

## 2017-07-04 MED ORDER — OMEPRAZOLE 20 MG PO CPDR: 20.0000 mg | DELAYED_RELEASE_CAPSULE | Freq: Two times a day (BID) | ORAL | 0 refills | Status: DC

## 2017-07-04 MED ORDER — FLUCONAZOLE 100 MG PO TABS: 100.0000 mg | ORAL_TABLET | Freq: Every day | ORAL | 0 refills | Status: AC

## 2017-07-05 ENCOUNTER — Telehealth: Payer: Self-pay | Admitting: Gastroenterology

## 2017-07-05 NOTE — Telephone Encounter (Signed)
The pt was advised that the new abx was prescribed and she can pick that up at her convenience

## 2017-07-06 ENCOUNTER — Other Ambulatory Visit: Payer: Self-pay

## 2017-07-06 MED ORDER — AMOXICILLIN 500 MG PO TABS: 1000.0000 mg | ORAL_TABLET | Freq: Two times a day (BID) | ORAL | 0 refills | Status: AC

## 2017-07-06 MED ORDER — CLARITHROMYCIN 500 MG PO TABS: 500.0000 mg | ORAL_TABLET | Freq: Two times a day (BID) | ORAL | 0 refills | Status: AC

## 2017-07-06 NOTE — Telephone Encounter (Signed)
Patient called answering service 07/05/17 at 6:25pm states pharmacy cannot get Rx. Best call back # [phone].

## 2017-07-06 NOTE — Telephone Encounter (Signed)
Patient calling back to discuss problem getting medication.

## 2017-07-06 NOTE — Addendum Note (Signed)
Addended by: Jeoffrey Massed on: 07/06/2017 01:34 PM   Modules accepted: Orders

## 2017-07-17 ENCOUNTER — Other Ambulatory Visit: Payer: Self-pay

## 2017-07-17 MED ORDER — OMEPRAZOLE 20 MG PO CPDR: 20.0000 mg | DELAYED_RELEASE_CAPSULE | Freq: Two times a day (BID) | ORAL | 3 refills | Status: DC

## 2017-08-03 ENCOUNTER — Telehealth: Payer: Self-pay | Admitting: Gastroenterology

## 2017-08-03 NOTE — Telephone Encounter (Signed)
The pt was advised to continue prilosec bid and call back if no better in a few weeks.  She was advised it takes about 6 weeks to notice real relief in her symptoms

## 2017-08-07 ENCOUNTER — Encounter: Payer: Self-pay | Admitting: Internal Medicine

## 2017-08-15 ENCOUNTER — Telehealth: Payer: Self-pay | Admitting: Gastroenterology

## 2017-08-15 NOTE — Telephone Encounter (Signed)
Pt still has stomach pain. Please call her

## 2017-08-15 NOTE — Telephone Encounter (Signed)
Patient has been taking the Prilosec BID since 3/15, she has continued to have intermittent abdominal pain, bloating, belching. She also reports that her hands have swollen up in the last couple of days. She is able to have bm's so long as she takes her Linzess. Please advise.

## 2017-08-17 NOTE — Telephone Encounter (Signed)
Patient advised, scheduled to see APP on April 8, first available.

## 2017-08-17 NOTE — Telephone Encounter (Signed)
Dr. Ardis Hughs just checking back with you on this patient call. Thank you.

## 2017-08-17 NOTE — Telephone Encounter (Signed)
It can take several weeks to notice an improvement in symptoms follow H. Pylori treatment  Which she should have completed 2 weeks ago.  Advise her to continue the BID PPI. Offer my next available ROV.  I don't know why her hands have swollen, that is not a side effect of any of her meds that I am aware of .  thanks

## 2017-08-27 ENCOUNTER — Ambulatory Visit (INDEPENDENT_AMBULATORY_CARE_PROVIDER_SITE_OTHER): Payer: Medicare Other | Admitting: Gastroenterology

## 2017-08-27 ENCOUNTER — Other Ambulatory Visit (INDEPENDENT_AMBULATORY_CARE_PROVIDER_SITE_OTHER): Payer: Medicare Other

## 2017-08-27 ENCOUNTER — Encounter: Payer: Self-pay | Admitting: Gastroenterology

## 2017-08-27 VITALS — BP 120/76 | HR 56 | Ht 66.0 in | Wt 172.0 lb

## 2017-08-27 DIAGNOSIS — R140 Abdominal distension (gaseous): Secondary | ICD-10-CM

## 2017-08-27 DIAGNOSIS — R110 Nausea: Secondary | ICD-10-CM | POA: Insufficient documentation

## 2017-08-27 DIAGNOSIS — R1030 Lower abdominal pain, unspecified: Secondary | ICD-10-CM

## 2017-08-27 LAB — BASIC METABOLIC PANEL
BUN: 12 mg/dL (ref 6–23)
CHLORIDE: 101 meq/L (ref 96–112)
CO2: 31 meq/L (ref 19–32)
CREATININE: 0.91 mg/dL (ref 0.40–1.20)
Calcium: 9.8 mg/dL (ref 8.4–10.5)
GFR: 81.82 mL/min (ref >60.00)
GLUCOSE: 85 mg/dL (ref 70–99)
Potassium: 4.4 mEq/L (ref 3.5–5.1)
Sodium: 140 mEq/L (ref 135–145)

## 2017-08-27 LAB — TSH: TSH: 1.18 u[IU]/mL (ref 0.35–4.50)

## 2017-08-27 NOTE — Patient Instructions (Addendum)
If you are age 58 or older, your body mass index should be between 23-30. Your Body mass index is 27.76 kg/m. If this is out of the aforementioned range listed, please consider follow up with your Primary Care Provider.  If you are age 58 or younger, your body mass index should be between 19-25. Your Body mass index is 27.76 kg/m. If this is out of the aformentioned range listed, please consider follow up with your Primary Care Provider.   Your provider has requested that you go to the basement level for lab work before leaving today. Press "B" on the elevator. The lab is located at the first door on the left as you exit the elevator. BMP TSH PLEASE DO NOT DO STOOL STUDY UNTIL April 25 OR LATER.  You have been scheduled for a CT scan of the abdomen and pelvis at [facility] ([address]---this is in the same building as Press photographer).   You are scheduled on 4/58/19 at 1 pm. You should arrive 15 minutes prior to your appointment time for registration. Please follow the written instructions below on the day of your exam:  WARNING: IF YOU ARE ALLERGIC TO IODINE/X-RAY DYE, PLEASE NOTIFY RADIOLOGY IMMEDIATELY AT [phone]! YOU WILL BE GIVEN A 13 HOUR PREMEDICATION PREP.  1) Do not eat or drink anything after 9 am (4 hours prior to your test) 2) You have been given 2 bottles of oral contrast to drink. The solution may taste better if refrigerated, but do NOT add ice or any other liquid to this solution. Shake well before drinking.    Drink 1 bottle of contrast @ 11 am (2 hours prior to your exam)  Drink 1 bottle of contrast @ 12 pm (1 hour prior to your exam)  You may take any medications as prescribed with a small amount of water except for the following: Metformin, Glucophage, Glucovance, Avandamet, Riomet, Fortamet, Actoplus Met, Janumet, Glumetza or Metaglip. The above medications must be held the day of the exam AND 48 hours after the exam.  The purpose of you  drinking the oral contrast is to aid in the visualization of your intestinal tract. The contrast solution may cause some diarrhea. Before your exam is started, you will be given a small amount of fluid to drink. Depending on your individual set of symptoms, you may also receive an intravenous injection of x-ray contrast/dye. Plan on being at [facility] for 30 minutes or longer, depending on the type of exam you are having performed.  This test typically takes 30-45 minutes to complete.  If you have any questions regarding your exam or if you need to reschedule, you may call the CT department at [phone] between the hours of 8:00 am and 5:00 pm, Monday-Friday.  ________________________________________________________________________ Dennis Bast have been given samples of IBgard.  Thank you for choosing me and [facility].   Alonza Bogus, PA-C

## 2017-08-27 NOTE — Progress Notes (Signed)
08/27/2017 Gina Espinoza [id number] [birth year]   HISTORY OF PRESENT ILLNESS: This is a 57 year old female who is known to Dr. Ardis Hughs.  She recently underwent both EGD and colonoscopy and February 2019.  EGD showed gastritis and was positive for H. pylori.  She completed 2-week course of Pylera.  Colonoscopy revealed only diverticulosis and internal hemorrhoids.  She presents her office today with ongoing complaints of nausea and belching along with diffuse/generalized abdominal bloating and discomfort.  She says that all this is been going on about the past 6 or 7 months or so.  She tells me that she keeps gaining weight and they treated her with fluid pills thinking that it was fluid retention, but that did not help.  She is on several medications.  She is taking Linzess 290 mcg daily and MiraLAX daily.  As long as she takes that she seems to move her bowels well.  She is on omeprazole 20 mg twice daily for her upper GI symptoms.  Had previously normal GES.  Past Medical History:  Diagnosis Date  . Anxiety   . Chest pain, unspecified   . Cough   . Depression   . Fibromyalgia   . Headache(784.0)   . Other and unspecified hyperlipidemia    mixed  . Personal history of unspecified circulatory disease   . Unspecified essential hypertension    Past Surgical History:  Procedure Laterality Date  . anterior perineoplasty     posterior repair. 05/16/02  . BLADDER SUSPENSION    . CARDIAC CATHETERIZATION  2006   normal coronaries  . ESOPHAGOGASTRODUODENOSCOPY N/A 02/09/2013   Procedure: ESOPHAGOGASTRODUODENOSCOPY (EGD);  Surgeon: Milus Banister, MD;  Location: [facility];  Service: Endoscopy;  Laterality: N/A;  . excision sebaceous cyst  11/2013   posterior scalp  . HEMATOMA EVACUATION     vaginal hematoma 05/23/02  . shoulder surgery     sebacceous cyst removal  . UTERINE SUSPENSION      reports that she has never smoked. She has never used smokeless tobacco. She reports that she  does not drink alcohol or use drugs. family history includes Allergies in her mother; Asthma in her daughter and grandchild; Breast cancer in her maternal grandmother; Colon cancer in her brother; Heart disease in her father. Allergies  Allergen Reactions  . Savella [Milnacipran Hcl] Anaphylaxis  . Bee Venom Swelling  . Codeine Hives and Itching  . Ivp Dye [Iodinated Diagnostic Agents] Hives and Itching      Outpatient Encounter Medications as of 08/27/2017  Medication Sig  . ALPRAZolam (XANAX) 1 MG tablet TAKE 1 TABLET EVERY 8 HOURS AS NEEDED FOR ANXIETY  . alprazolam (XANAX) 2 MG tablet Take 2 mg by mouth every 4 (four) hours as needed for sleep.  Marland Kitchen amLODipine (NORVASC) 2.5 MG tablet TAKE 1 TABLET (2.5 MG TOTAL) BY MOUTH DAILY.  Marland Kitchen azelastine (ASTELIN) 0.1 % nasal spray PLACE 2 SPRAYS INTO BOTH NOSTRILS 2 (TWO) TIMES DAILY. USE IN EACH NOSTRIL AS DIRECTED  . bethanechol (URECHOLINE) 10 MG tablet Take 10 mg by mouth 3 (three) times daily as needed (bladder spasms).  . cetirizine (ZYRTEC) 10 MG tablet Take 1 tablet (10 mg total) by mouth daily.  . diazepam (VALIUM) 5 MG tablet Take 5 mg by mouth every 6 (six) hours as needed for anxiety (1-2 tabs at a time).  Marland Kitchen EPINEPHrine 0.3 mg/0.3 mL IJ SOAJ injection Inject 0.3 mLs (0.3 mg total) into the muscle once.  Marland Kitchen erythromycin ophthalmic ointment  1 application at bedtime.  Marland Kitchen escitalopram (LEXAPRO) 20 MG tablet Take 20 mg by mouth daily.  . fentaNYL (DURAGESIC - DOSED MCG/HR) 50 MCG/HR Place 50 mcg onto the skin every 3 (three) days.  . fluconazole (DIFLUCAN) 150 MG tablet Take 150 mg by mouth daily.  . fluticasone (FLONASE) 50 MCG/ACT nasal spray PLACE 2 SPRAYS INTO BOTH NOSTRILS DAILY.  Marland Kitchen HYDROcodone-acetaminophen (NORCO) 10-325 MG tablet Take 1 tablet by mouth every 6 (six) hours as needed.  Marland Kitchen HYDROmorphone (DILAUDID) 2 MG tablet Take 1 tablet (2 mg total) by mouth every 4 (four) hours as needed for pain.  Marland Kitchen levocetirizine (XYZAL) 5 MG tablet  TAKE 1 TABLET (5 MG TOTAL) BY MOUTH EVERY EVENING.  Marland Kitchen lidocaine (LIDODERM) 5 % Place 1 patch onto the skin daily. Remove & Discard patch within 12 hours or as directed by MD//3 patches at a time, 12 hours on and 12 hours off   . lidocaine (XYLOCAINE) 2 % solution Use a small amount in the mouth prior to eating.  . lidocaine (XYLOCAINE) 5 % ointment Apply 1 application topically as needed.  . metaxalone (SKELAXIN) 800 MG tablet Take 800 mg by mouth 2 (two) times daily as needed (muscle spasms).   . methocarbamol (ROBAXIN) 500 MG tablet Take 500 mg by mouth every 8 (eight) hours as needed for muscle spasms.  . mirabegron ER (MYRBETRIQ) 25 MG TB24 tablet Take 25 mg by mouth daily.  . montelukast (SINGULAIR) 10 MG tablet Take 1 tablet (10 mg total) by mouth at bedtime.  Marland Kitchen morphine (MS CONTIN) 15 MG 12 hr tablet Take 1 tablet (15 mg total) by mouth 2 (two) times daily as needed for pain.  . nitroGLYCERIN (NITROSTAT) 0.4 MG SL tablet PLACE 1 TAB ON TONGUE EVERY 5 MIN AS NEEDED IF PAIN PERSISTS, CALL 911  . olopatadine (PATANOL) 0.1 % ophthalmic solution Place 1 drop into both eyes 2 (two) times daily as needed for allergies.   Marland Kitchen omeprazole (PRILOSEC) 20 MG capsule Take 1 capsule (20 mg total) by mouth 2 (two) times daily before a meal.  . Oxycodone HCl 10 MG TABS Take 10 mg by mouth as needed.  . polyethylene glycol (MIRALAX / GLYCOLAX) packet Take 17 g by mouth 2 (two) times daily. (Patient taking differently: Take 17 g by mouth daily. )  . promethazine (PHENERGAN) 25 MG tablet Take 25 mg by mouth every 6 (six) hours as needed for nausea or vomiting.  . rosuvastatin (CRESTOR) 5 MG tablet TAKE 1 TABLET (5 MG TOTAL) BY MOUTH AT BEDTIME.  . tamsulosin (FLOMAX) 0.4 MG CAPS capsule Take 0.4 mg by mouth daily.  Marland Kitchen tiZANidine (ZANAFLEX) 2 MG tablet TAKE (1) TABLET BY MOUTH EVERY 6 TO 8 HOURS AS NEEDED**MAXIMUM OF 3 DOSES IN 24 HOURS**  . triamterene-hydrochlorothiazide (MAXZIDE) 75-50 MG tablet TAKE 1 TABLET BY  MOUTH EVERY DAY AS NEEDED FOR FLUID RETENTION  . valACYclovir (VALTREX) 500 MG tablet Take 500 mg by mouth daily as needed (infection).   Marland Kitchen zolpidem (AMBIEN) 10 MG tablet Take 1 tablet (10 mg total) by mouth every other day. Alternates with temazepam  . linaclotide (LINZESS) 290 MCG CAPS capsule Take 1 capsule (290 mcg total) by mouth daily.  . [DISCONTINUED] nitroGLYCERIN (NITROSTAT) 0.4 MG SL tablet Place 1 tablet (0.4 mg total) under the tongue every 5 (five) minutes as needed. if pain persist call 911  . [DISCONTINUED] pantoprazole (PROTONIX) 40 MG tablet Take 1 tablet (40 mg total) by mouth daily at 6 (  six) AM.   Facility-Administered Encounter Medications as of 08/27/2017  Medication  . 0.9 %  sodium chloride infusion     REVIEW OF SYSTEMS  : All other systems reviewed and negative except where noted in the History of Present Illness.   PHYSICAL EXAM: BP 120/76   Pulse (!) 56   Ht 5\' 6"  (1.676 m)   Wt 172 lb (78 kg)   BMI 27.76 kg/m  General: Well developed black female in no acute distress Head: Normocephalic and atraumatic Eyes:  Sclerae anicteric, conjunctiva pink. Ears: Normal auditory acuity Lungs: Clear throughout to auscultation; no increased WOB. Heart: Regular rate and rhythm; no M/R/G. Abdomen: Soft, non-distended.  BS present.  Lower abdominal TTP. Musculoskeletal: Symmetrical with no gross deformities  Skin: No lesions on visible extremities Extremities: No edema  Neurological: Alert oriented x 4, grossly non-focal Psychological:  Alert and cooperative. Normal mood and affect  ASSESSMENT AND PLAN: *Hpylori:  Completed 2 weeks of Pylera.  Still having nausea and a lot of belching.  Can take some time for symptoms to improve.  Will plan to check stool for Hpylori Ag after 4/25. *Generalized abdominal bloating and lower abdominal pain:  Will check CT scan abdomen and pelvis with contrast.  Will check TSH.  Will try IBgard (samples given).  **? If some of her  symptoms, especially the nausea and bloating, are due to her medications as she takes several.   CC:  Denita Lung, MD

## 2017-08-28 ENCOUNTER — Encounter: Payer: Self-pay | Admitting: Family Medicine

## 2017-08-28 ENCOUNTER — Ambulatory Visit (INDEPENDENT_AMBULATORY_CARE_PROVIDER_SITE_OTHER): Payer: Medicare Other | Admitting: Family Medicine

## 2017-08-28 VITALS — BP 110/72 | HR 64 | Temp 98.4°F | Ht 66.0 in | Wt 172.6 lb

## 2017-08-28 DIAGNOSIS — J309 Allergic rhinitis, unspecified: Secondary | ICD-10-CM | POA: Diagnosis not present

## 2017-08-28 DIAGNOSIS — I10 Essential (primary) hypertension: Secondary | ICD-10-CM

## 2017-08-28 DIAGNOSIS — F331 Major depressive disorder, recurrent, moderate: Secondary | ICD-10-CM

## 2017-08-28 DIAGNOSIS — J302 Other seasonal allergic rhinitis: Secondary | ICD-10-CM

## 2017-08-28 DIAGNOSIS — R079 Chest pain, unspecified: Secondary | ICD-10-CM | POA: Diagnosis not present

## 2017-08-28 DIAGNOSIS — R140 Abdominal distension (gaseous): Secondary | ICD-10-CM | POA: Diagnosis not present

## 2017-08-28 DIAGNOSIS — Z79899 Other long term (current) drug therapy: Secondary | ICD-10-CM

## 2017-08-28 DIAGNOSIS — E785 Hyperlipidemia, unspecified: Secondary | ICD-10-CM

## 2017-08-28 MED ORDER — MONTELUKAST SODIUM 10 MG PO TABS: 10.0000 mg | ORAL_TABLET | Freq: Every day | ORAL | 3 refills | Status: DC

## 2017-08-28 MED ORDER — AZELASTINE HCL 0.1 % NA SOLN: NASAL | 11 refills | Status: DC

## 2017-08-28 MED ORDER — AMLODIPINE BESYLATE 2.5 MG PO TABS: ORAL_TABLET | ORAL | 3 refills | Status: DC

## 2017-08-28 MED ORDER — NITROGLYCERIN 0.4 MG SL SUBL: SUBLINGUAL_TABLET | SUBLINGUAL | 1 refills | Status: DC

## 2017-08-28 NOTE — Progress Notes (Signed)
Gina Espinoza is a 57 y.o. female who presents for annual wellness visit and follow-up on chronic medical conditions.  She has the following concerns: She continues have difficulty with abdominal distress.  She is being followed by GI . She was recently treated for H. pylori but continues have difficulty. Within the next several weeks.  She has a previous history of nonspecific chest pain and was given nitroglycerin.  She would like to get a refill.  She does occasionally have difficulty with chest pain But cannot associate this with any physical activity, shortness of breath, diaphoresis.  She does have seasonal allergies and presently is on medications for that getting good relief.  She takes Crestor for her cholesterol.  Continues on Norvasc and is having no difficulty with that.  She does see her gynecologist who is handling the Lexapro, Xanax and diazepam.  She apparently did get both Shingrix vaccines.   She does have follow-up immunizations and Health Maintenance Immunization History  Administered Date(s) Administered  . Hepatitis B 03/10/2008, 04/10/2008, 09/08/2008  . Influenza,inj,Quad PF,6+ Mos 02/11/2013, 06/18/2014, 06/08/2015, 02/24/2016  . MMR 03/10/2008, 04/10/2008  . Tdap 03/10/2008  . Varicella 02/28/2008, 04/10/2008  . Zoster Recombinat (Shingrix) 11/17/2016   Health Maintenance Due  Topic Date Due  . HIV Screening  03/22/1975  . MAMMOGRAM  12/17/2015    Last Pap smear:yearly Last mammogram:2018Last colonoscopy: Last DEXA:N/A Dentist:? Ophtho:? Exercise: No regular exercise program  Other doctors caring for patient include:Grewal Jacobs,Honig,Tsuei  Advanced directives:not asked    Depression screen:  See questionnaire below.  No flowsheet data found.  Fall Risk Screen: see questionnaire below.   ADL screen:  See questionnaire below Functional Status Survey:     Review of Systems Constitutional: -, -unexpected weight change, -anorexia, -fatigue Allergy:  -sneezing, -itching, -congestion Dermatology: denies changing moles, rash, lumps ENT: -runny nose, -ear pain, -sore throat,  Cardiology:  -chest pain, -palpitations, -orthopnea, Respiratory: -cough, -shortness of breath, -dyspnea on exertion, -wheezing,  Gastroenterology: -abdominal pain, -nausea, -vomiting, -diarrhea, -constipation, -dysphagia Hematology: -bleeding or bruising problems Musculoskeletal: -arthralgias, -myalgias, -joint swelling, -back pain, - Ophthalmology: -vision changes,  Urology: -dysuria, -difficulty urinating,  -urinary frequency, -urgency, incontinence Neurology: -, -numbness, , -memory loss, -falls, -dizziness    PHYSICAL EXAM:  BP 110/72 (BP Location: Left Arm, Patient Position: Sitting)   Pulse 64   Temp 98.4 F (36.9 C)   Ht '5\' 6"'$  (1.676 m)   Wt 172 lb 9.6 oz (78.3 kg)   SpO2 97%   BMI 27.86 kg/m   General Appearance: Alert, cooperative, no distress, appears stated age Head: Normocephalic, without obvious abnormality, atraumatic Eyes: PERRL, conjunctiva/corneas clear, EOM's intact, fundi benign Ears: Normal TM's and external ear canals Nose: Nares normal, mucosa normal, no drainage or sinus tenderness Throat: Lips, mucosa, and tongue normal; teeth and gums normal Neck: Supple, no lymphadenopathy;  thyroid:  no enlargement/tenderness/nodules; no carotid bruit or JVD Lungs: Clear to auscultation bilaterally without wheezes, rales or ronchi; respirations unlabored Heart: Regular rate and rhythm, S1 and S2 normal, no murmur, rubor gallop Abdomen: Soft, non-tender, nondistended, normoactive bowel sounds,  no masses, no hepatosplenomegaly Extremities: No clubbing, cyanosis or edema Pulses: 2+ and symmetric all extremities Skin:  Skin color, texture, turgor normal, no rashes or lesions Lymph nodes: Cervical, supraclavicular, and axillary nodes normal Neurologic:  CNII-XII intact, normal strength, sensation and gait; reflexes 2+ and symmetric  throughout Psych: Normal mood, affect, hygiene and grooming.  ASSESSMENT/PLAN: Encounter for long-term (current) use of medications  Essential hypertension -  Plan: CBC with Differential/Platelet, Comprehensive metabolic panel, amLODipine (NORVASC) 2.5 MG tablet  Chronic seasonal allergic rhinitis - Plan: azelastine (ASTELIN) 0.1 % nasal spray, montelukast (SINGULAIR) 10 MG tablet  Chest pain, unspecified type - Plan: CBC with Differential/Platelet, Comprehensive metabolic panel, Lipid panel, nitroGLYCERIN (NITROSTAT) 0.4 MG SL tablet  Abdominal bloating  Depression, major, recurrent, moderate (HCC)  Hyperlipidemia LDL goal <100 - Plan: Lipid panel  Allergic rhinitis, unspecified seasonality, unspecified trigger - Plan: azelastine (ASTELIN) 0.1 % nasal spray, montelukast (SINGULAIR) 10 MG tablet     Discussed monthly self breast exams and yearly mammograms; at least 30 minutes of aerobic activity at least 5 days/week and weight-bearing exercise 2x/week;  Colonoscopy recommendations reviewed   Medicare Attestation I have personally reviewed: The patient's medical and social history Their use of alcohol, tobacco or illicit drugs Their current medications and supplements The patient's functional ability including ADLs,fall risks, home safety risks, cognitive, and hearing and visual impairment Diet and physical activities Evidence for depression or mood disorders  The patient's weight, height, and BMI have been recorded in the chart.  I have made referrals, counseling, and provided education to the patient based on review of the above and I have provided the patient with a written personalized care plan for preventive services.

## 2017-08-28 NOTE — Progress Notes (Signed)
I agree with the above  Note, plan

## 2017-08-29 LAB — COMPREHENSIVE METABOLIC PANEL
ALT: 12 IU/L (ref 0–32)
AST: 19 IU/L (ref 0–40)
Albumin/Globulin Ratio: 1.4 (ref 1.2–2.2)
Albumin: 4.5 g/dL (ref 3.5–5.5)
Alkaline Phosphatase: 121 IU/L — ABNORMAL HIGH (ref 39–117)
BILIRUBIN TOTAL: 0.4 mg/dL (ref 0.0–1.2)
BUN/Creatinine Ratio: 16 (ref 9–23)
BUN: 15 mg/dL (ref 6–24)
CALCIUM: 9.6 mg/dL (ref 8.7–10.2)
CHLORIDE: 103 mmol/L (ref 96–106)
CO2: 23 mmol/L (ref 20–29)
CREATININE: 0.91 mg/dL (ref 0.57–1.00)
GFR calc non Af Amer: 70 mL/min/{1.73_m2} (ref >59)
GFR, EST AFRICAN AMERICAN: 81 mL/min/{1.73_m2} (ref >59)
GLUCOSE: 96 mg/dL (ref 65–99)
Globulin, Total: 3.3 g/dL (ref 1.5–4.5)
Potassium: 4.9 mmol/L (ref 3.5–5.2)
Sodium: 144 mmol/L (ref 134–144)
TOTAL PROTEIN: 7.8 g/dL (ref 6.0–8.5)

## 2017-08-29 LAB — LIPID PANEL
Chol/HDL Ratio: 3.1 ratio (ref 0.0–4.4)
Cholesterol, Total: 166 mg/dL (ref 100–199)
HDL: 53 mg/dL (ref >39)
LDL CALC: 97 mg/dL (ref 0–99)
Triglycerides: 81 mg/dL (ref 0–149)
VLDL CHOLESTEROL CAL: 16 mg/dL (ref 5–40)

## 2017-08-29 LAB — CBC WITH DIFFERENTIAL/PLATELET
Basophils Absolute: 0 10*3/uL (ref 0.0–0.2)
Basos: 0 %
EOS (ABSOLUTE): 0 10*3/uL (ref 0.0–0.4)
EOS: 1 %
HEMOGLOBIN: 12.2 g/dL (ref 11.1–15.9)
Hematocrit: 37.4 % (ref 34.0–46.6)
IMMATURE GRANS (ABS): 0 10*3/uL (ref 0.0–0.1)
Immature Granulocytes: 0 %
LYMPHS ABS: 1.8 10*3/uL (ref 0.7–3.1)
LYMPHS: 41 %
MCH: 25.5 pg — ABNORMAL LOW (ref 26.6–33.0)
MCHC: 32.6 g/dL (ref 31.5–35.7)
MCV: 78 fL — ABNORMAL LOW (ref 79–97)
MONOCYTES: 7 %
Monocytes Absolute: 0.3 10*3/uL (ref 0.1–0.9)
NEUTROS ABS: 2.3 10*3/uL (ref 1.4–7.0)
Neutrophils: 51 %
Platelets: 309 10*3/uL (ref 150–379)
RBC: 4.79 x10E6/uL (ref 3.77–5.28)
RDW: 15.3 % (ref 12.3–15.4)
WBC: 4.5 10*3/uL (ref 3.4–10.8)

## 2017-08-30 ENCOUNTER — Telehealth: Payer: Self-pay

## 2017-08-30 ENCOUNTER — Telehealth: Payer: Self-pay | Admitting: Gastroenterology

## 2017-08-30 NOTE — Telephone Encounter (Signed)
I spoke with patient today regarding her CT for next week using IV contrast.  She states she will need to think about it.  She isnt sure shw wants to do that.  She asked when Janett Billow, would be back in office.  I told her next week.  She was made aware that her labs are normal and you would like to have the CT with IV contrast as well as oral.  Please contact the patient as she would like to talk with you regarding this.  Thanks Peter Congo

## 2017-08-30 NOTE — Telephone Encounter (Signed)
The pt was scheduled for CT without IV contrast due to allergy.  She is still concerned that she can't drink the oral contrast.  She did have a CT in 9480 without complication.  She was given the number to CT to discuss any risk with oral contrast.

## 2017-08-30 NOTE — Telephone Encounter (Signed)
-----   Message from Loralie Champagne, PA-C sent at 08/27/2017  5:23 PM EDT ----- Will you please let the patient know that her labs are normal, including her TSH.  Also, we had planned for her to do the CT scan with oral contrast only due to her IVP dye allergy, but if she is willing then I may actually like her to do the IV contrast with the premedicated prep.  Please instruct on prednisone prep if she is willing.  Sorry and thank you!  Jess

## 2017-09-03 ENCOUNTER — Telehealth: Payer: Self-pay | Admitting: Family Medicine

## 2017-09-03 ENCOUNTER — Other Ambulatory Visit: Payer: Self-pay | Admitting: Family Medicine

## 2017-09-03 DIAGNOSIS — J302 Other seasonal allergic rhinitis: Secondary | ICD-10-CM

## 2017-09-03 DIAGNOSIS — E785 Hyperlipidemia, unspecified: Secondary | ICD-10-CM

## 2017-09-03 MED ORDER — LEVOCETIRIZINE DIHYDROCHLORIDE 5 MG PO TABS: ORAL_TABLET | ORAL | 3 refills | Status: DC

## 2017-09-03 NOTE — Telephone Encounter (Signed)
Pt called and is requesting a refill on her amlopdipine and her levocetirizine(XYZAL) pt would like 3 month supplys for 1 years states it is cheaper that way for her pt uses [id number] - [place], [place] - [address] pt can be reached at [phone]

## 2017-09-03 NOTE — Telephone Encounter (Signed)
Pt was called and med where sent in on her behalf. [place]

## 2017-09-05 ENCOUNTER — Encounter: Payer: Self-pay | Admitting: Family Medicine

## 2017-09-05 NOTE — Telephone Encounter (Signed)
Spoke with patient.  She is going to proceed with oral contrast only since she had a bad reaction to the IV contrast and does not feel comfortable doing the preparation for IV contrast.    Thank you,  Jess

## 2017-09-07 ENCOUNTER — Ambulatory Visit (INDEPENDENT_AMBULATORY_CARE_PROVIDER_SITE_OTHER)
Admission: RE | Admit: 2017-09-07 | Discharge: 2017-09-07 | Disposition: A | Discharge status: Home / Self Care | Payer: Medicare Other | Source: Ambulatory Visit | Attending: Gastroenterology | Admitting: Gastroenterology

## 2017-09-07 DIAGNOSIS — R1030 Lower abdominal pain, unspecified: Secondary | ICD-10-CM | POA: Diagnosis not present

## 2017-09-07 DIAGNOSIS — R140 Abdominal distension (gaseous): Secondary | ICD-10-CM

## 2017-09-07 DIAGNOSIS — R110 Nausea: Secondary | ICD-10-CM

## 2017-09-14 ENCOUNTER — Telehealth: Payer: Self-pay | Admitting: Gastroenterology

## 2017-09-14 NOTE — Telephone Encounter (Signed)
Spoke to patient. She will come to office at her convenience to pick up samples of IB guard. A coupon was also given.

## 2017-09-16 ENCOUNTER — Other Ambulatory Visit: Payer: Self-pay | Admitting: Family Medicine

## 2017-09-16 DIAGNOSIS — K219 Gastro-esophageal reflux disease without esophagitis: Secondary | ICD-10-CM

## 2017-09-25 ENCOUNTER — Other Ambulatory Visit: Payer: Medicare Other

## 2017-09-25 DIAGNOSIS — R110 Nausea: Secondary | ICD-10-CM

## 2017-09-25 DIAGNOSIS — R140 Abdominal distension (gaseous): Secondary | ICD-10-CM | POA: Diagnosis not present

## 2017-09-25 DIAGNOSIS — R1030 Lower abdominal pain, unspecified: Secondary | ICD-10-CM | POA: Diagnosis not present

## 2017-09-26 ENCOUNTER — Other Ambulatory Visit: Payer: Self-pay | Admitting: Family Medicine

## 2017-09-26 DIAGNOSIS — R079 Chest pain, unspecified: Secondary | ICD-10-CM

## 2017-09-26 LAB — HELICOBACTER PYLORI  SPECIAL ANTIGEN
MICRO NUMBER: 90555568
SPECIMEN QUALITY: ADEQUATE

## 2017-10-02 ENCOUNTER — Telehealth: Payer: Self-pay | Admitting: Gastroenterology

## 2017-10-02 NOTE — Telephone Encounter (Signed)
Stool for Hpylori was negative.

## 2017-10-02 NOTE — Telephone Encounter (Signed)
The patient has been notified of this information and all questions answered.

## 2017-10-02 NOTE — Telephone Encounter (Signed)
Gina Espinoza have you reviewed the 09/25/17 lab, the pt is calling for result?

## 2017-10-08 DIAGNOSIS — M797 Fibromyalgia: Secondary | ICD-10-CM | POA: Diagnosis not present

## 2017-10-19 ENCOUNTER — Telehealth: Payer: Self-pay

## 2017-10-19 DIAGNOSIS — R079 Chest pain, unspecified: Secondary | ICD-10-CM

## 2017-10-19 MED ORDER — NITROGLYCERIN 0.4 MG SL SUBL: SUBLINGUAL_TABLET | SUBLINGUAL | 0 refills | Status: DC

## 2017-10-19 NOTE — Telephone Encounter (Signed)
Received fax refill request for 90 day supply for Nitroglycern Sub  0.4 mg  Qty 75 place 1 tab on tongue every 5 mins as needed if pain persists, call 911.  [facility] [address]

## 2017-11-08 DIAGNOSIS — R5383 Other fatigue: Secondary | ICD-10-CM | POA: Diagnosis not present

## 2017-11-08 DIAGNOSIS — M797 Fibromyalgia: Secondary | ICD-10-CM | POA: Diagnosis not present

## 2017-12-05 DIAGNOSIS — M797 Fibromyalgia: Secondary | ICD-10-CM | POA: Diagnosis not present

## 2018-01-02 DIAGNOSIS — M797 Fibromyalgia: Secondary | ICD-10-CM | POA: Diagnosis not present

## 2018-01-08 DIAGNOSIS — M542 Cervicalgia: Secondary | ICD-10-CM | POA: Diagnosis not present

## 2018-01-08 DIAGNOSIS — M25552 Pain in left hip: Secondary | ICD-10-CM | POA: Diagnosis not present

## 2018-01-08 DIAGNOSIS — M25561 Pain in right knee: Secondary | ICD-10-CM | POA: Diagnosis not present

## 2018-01-16 DIAGNOSIS — M25561 Pain in right knee: Secondary | ICD-10-CM | POA: Diagnosis not present

## 2018-01-22 DIAGNOSIS — Z01419 Encounter for gynecological examination (general) (routine) without abnormal findings: Secondary | ICD-10-CM | POA: Diagnosis not present

## 2018-01-22 DIAGNOSIS — Z1231 Encounter for screening mammogram for malignant neoplasm of breast: Secondary | ICD-10-CM | POA: Diagnosis not present

## 2018-01-22 DIAGNOSIS — Z779 Other contact with and (suspected) exposures hazardous to health: Secondary | ICD-10-CM | POA: Diagnosis not present

## 2018-01-28 DIAGNOSIS — M25561 Pain in right knee: Secondary | ICD-10-CM | POA: Diagnosis not present

## 2018-01-28 DIAGNOSIS — M542 Cervicalgia: Secondary | ICD-10-CM | POA: Diagnosis not present

## 2018-02-07 ENCOUNTER — Encounter: Payer: Self-pay | Admitting: Rheumatology

## 2018-02-07 DIAGNOSIS — M542 Cervicalgia: Secondary | ICD-10-CM | POA: Diagnosis not present

## 2018-02-12 DIAGNOSIS — M25521 Pain in right elbow: Secondary | ICD-10-CM | POA: Diagnosis not present

## 2018-02-12 DIAGNOSIS — M542 Cervicalgia: Secondary | ICD-10-CM | POA: Diagnosis not present

## 2018-02-15 ENCOUNTER — Ambulatory Visit (INDEPENDENT_AMBULATORY_CARE_PROVIDER_SITE_OTHER): Payer: Medicare Other | Admitting: Family Medicine

## 2018-02-15 ENCOUNTER — Encounter: Payer: Self-pay | Admitting: Family Medicine

## 2018-02-15 VITALS — BP 140/80 | HR 73 | Temp 98.2°F

## 2018-02-15 DIAGNOSIS — S8991XA Unspecified injury of right lower leg, initial encounter: Secondary | ICD-10-CM

## 2018-02-15 NOTE — Progress Notes (Signed)
   Subjective:    Patient ID: Gina Espinoza, female    DOB: April 01, 1961, 57 y.o.   MRN: [id number]  HPI Chief Complaint  Patient presents with  . fell    fell and hurt on right knee. fell an hour ago.    She is here with complaints of right knee pain and swelling post fall one hour ago. States she fell due to tripping in her house while wearing bedroom slippers that are not supportive.  She fell forward onto her right knee. States she is having right knee and right arm pain. Denies hitting her head or LOC. No new neck or back pain.  Reports a history of chronic right knee pain and states she saw Dr. Gladstone Lighter 3 weeks ago and she had a right knee XR and MRI.  She was told everything looked okay.  Apparently she has a history of chronic pain and has several pain medications at home.  States she did not take any of these today.  She is ambulating with a cane.  No other complaints or concerns today.  Denies fever, chills, headache, dizziness, chest pain, palpitations, shortness of breath, abdominal pain, nausea, vomiting, diarrhea.  Reviewed allergies, medications, past medical, surgical, family, and social history.   Review of Systems Pertinent positives and negatives in the history of present illness.     Objective:   Physical Exam  Constitutional: She is oriented to person, place, and time. She appears well-developed and well-nourished. No distress.  HENT:  Head: Normocephalic and atraumatic.  Mouth/Throat: Oropharynx is clear and moist.  Eyes: Pupils are equal, round, and reactive to light. Conjunctivae are normal.  Neck: Normal range of motion. Neck supple.  Cardiovascular: Normal rate and regular rhythm.  Pulmonary/Chest: Effort normal.  Abdominal: Soft. She exhibits no distension.  Musculoskeletal:       Right knee: She exhibits decreased range of motion and swelling. She exhibits no erythema, normal alignment, no LCL laxity, normal patellar mobility and no MCL laxity.  Tenderness found.  Right knee with swelling and TTP even to light touch. No erythema or deformity noted, skin intact. Unable to do McMurrays as she is extremely TTP and movement.   Neurological: She is alert and oriented to person, place, and time. No cranial nerve deficit or sensory deficit.   BP 140/80   Pulse 73   Temp 98.2 F (36.8 C) (Oral)   SpO2 98%       Assessment & Plan:  Injury of right knee, initial encounter She does not appear to have any other injuries. RLE is neurovascularly intact. Declines XR and I am ok with this. Advised to use conservative treatment, RICE, and add an anti-inflammatory. Ace wrap applied. She has topical lidocaine at home she may use as well as several pain medications.  Follow up Monday if worse or not improving.

## 2018-02-15 NOTE — Patient Instructions (Addendum)
For pain control - rest your knee, use ice and a topical lidocaine gel that you have at home. You may also want to try an oral anti-inflammatory such as ibuprofen 800 mg three times daily or 2 Aleve twice daily.  If you get worse over the weekend, go to an urgent care or come back here or Dr. Charlestine Night office on Monday.

## 2018-02-18 ENCOUNTER — Ambulatory Visit (INDEPENDENT_AMBULATORY_CARE_PROVIDER_SITE_OTHER): Payer: Medicare Other | Admitting: Family Medicine

## 2018-02-18 ENCOUNTER — Encounter: Payer: Self-pay | Admitting: Family Medicine

## 2018-02-18 VITALS — BP 138/82 | HR 77 | Temp 98.3°F | Wt 173.8 lb

## 2018-02-18 DIAGNOSIS — S8991XA Unspecified injury of right lower leg, initial encounter: Secondary | ICD-10-CM

## 2018-02-18 NOTE — Progress Notes (Signed)
   Subjective:    Patient ID: Gina Espinoza, female    DOB: 1961-04-04, 57 y.o.   MRN: [id number]  HPI She is here for follow-up on recent knee trauma.  She has been using a wrap on that.  She is also taking her regular pain medications for her underlying fibromyalgia.  She does have pain with motion of the right knee.   Review of Systems     Objective:   Physical Exam Exam of the right knee shows slight tenderness palpation over the lower pole of the patella but no effusion.       Assessment & Plan:  Injury of right knee, initial encounter I explained that at this time, her normal pain medications are the only thing that we can offer as NSAIDs by her own account do not work well for her.  Explained that this is just going to have to take time.  She was not happy with this response.

## 2018-02-21 DIAGNOSIS — N958 Other specified menopausal and perimenopausal disorders: Secondary | ICD-10-CM | POA: Diagnosis not present

## 2018-02-21 DIAGNOSIS — M8588 Other specified disorders of bone density and structure, other site: Secondary | ICD-10-CM | POA: Diagnosis not present

## 2018-02-22 DIAGNOSIS — M25521 Pain in right elbow: Secondary | ICD-10-CM | POA: Diagnosis not present

## 2018-03-01 DIAGNOSIS — M25521 Pain in right elbow: Secondary | ICD-10-CM | POA: Diagnosis not present

## 2018-03-01 DIAGNOSIS — M25561 Pain in right knee: Secondary | ICD-10-CM | POA: Diagnosis not present

## 2018-03-26 DIAGNOSIS — M797 Fibromyalgia: Secondary | ICD-10-CM | POA: Diagnosis not present

## 2018-03-29 DIAGNOSIS — M25521 Pain in right elbow: Secondary | ICD-10-CM | POA: Diagnosis not present

## 2018-03-29 DIAGNOSIS — M25552 Pain in left hip: Secondary | ICD-10-CM | POA: Diagnosis not present

## 2018-04-10 NOTE — Progress Notes (Addendum)
Office Visit Note  Patient: Gina Espinoza             Date of Birth: 19-Oct-1960           MRN: [id number]             PCP: Denita Lung, MD Referring: Denita Lung, MD Visit Date: 04/16/2018 Occupation: @GUAROCC @  Subjective:  Right elbow joint pain  History of Present Illness: Gina Espinoza is a 57 y.o. female with history of myofascial pain syndrome and osteoarthritis.  Patient returns for evaluation after not being seen since April 21, 2015.  She states she has been seeing a chiropractor and Dr. Gladstone Lighter in the meantime.  She is having pain in multiple joints.  She continues to have generalized muscle aches and muscle tenderness.  Her muscle aches have been worsening with recent weather changes. She continues to have chronic fatigue. She has chronic neck pain, bilateral shoulder pain, right elbow joint pain, and bilateral knee joint pain. She has had worsening right elbow joint pain for the past 6 months. She reports right elbow joint swelling. She was started on a prednisone taper, Gabapentin, and Pennsaid.  She noticed minimal improvement while on prednisone. She declined a cortisone injection in the past.  She has noticed right upper extremity muscle weakness.  She has difficulty lifting objects and opening jars due to the pain the right elbow joint. she has not tried a brace. She has had a XR and MRI of the neck, right shoulder, right elbow, and right knee joint.  She walks with a cane.     Activities of Daily Living:  Patient reports morning stiffness for 45  minutes.   Patient Reports nocturnal pain.  Difficulty dressing/grooming: Denies Difficulty climbing stairs: Reports Difficulty getting out of chair: Reports Difficulty using hands for taps, buttons, cutlery, and/or writing: Reports  Review of Systems  Constitutional: Positive for fatigue.  HENT: Negative for mouth sores, mouth dryness and nose dryness.   Eyes: Positive for dryness. Negative for pain and visual  disturbance.  Respiratory: Negative for cough, hemoptysis, shortness of breath and difficulty breathing.   Cardiovascular: Negative for chest pain, palpitations, hypertension and swelling in legs/feet.  Gastrointestinal: Positive for constipation (Linzess). Negative for blood in stool and diarrhea.  Endocrine: Negative for increased urination.  Genitourinary: Negative for painful urination.  Musculoskeletal: Positive for arthralgias, joint pain, joint swelling, myalgias, muscle weakness, morning stiffness, muscle tenderness and myalgias.  Skin: Negative for color change, pallor, rash, hair loss, nodules/bumps, skin tightness, ulcers and sensitivity to sunlight.  Allergic/Immunologic: Negative for susceptible to infections.  Neurological: Negative for dizziness, numbness, headaches and weakness.  Hematological: Negative for swollen glands.  Psychiatric/Behavioral: Positive for sleep disturbance. Negative for depressed mood. The patient is nervous/anxious.     PMFS History:  Patient Active Problem List   Diagnosis Date Noted  . Abdominal bloating 08/27/2017  . Nausea 08/27/2017  . 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 Depression, major, recurrent, moderate ([place]) 05/28/2015  . Abdominal pain 02/10/2013  . Constipation 02/10/2013  . Fibromyalgia syndrome 11/22/2011  . Headache 11/20/2008  . Hyperlipidemia LDL goal <100 11/02/2008  . Essential hypertension 11/02/2008  . Chest pain 11/02/2008  . History of cardiovascular disorder 11/02/2008    Past Medical History:  Diagnosis Date  . Anxiety   . Chest pain, unspecified   . Cough   . Depression   . Fibromyalgia   . Headache(784.0)   .  Other and unspecified hyperlipidemia    mixed  . Personal history of unspecified circulatory disease   . Unspecified essential hypertension     Family History  Problem Relation Age of Onset  . Allergies Mother        also children  . Heart disease Father   .  Asthma Daughter   . Breast cancer Maternal Grandmother   . Colon cancer Brother   . Asthma Grandchild    Past Surgical History:  Procedure Laterality Date  . anterior perineoplasty     posterior repair. 05/16/02  . BLADDER SUSPENSION    . CARDIAC CATHETERIZATION  2006   normal coronaries  . ESOPHAGOGASTRODUODENOSCOPY N/A 02/09/2013   Procedure: ESOPHAGOGASTRODUODENOSCOPY (EGD);  Surgeon: Milus Banister, MD;  Location: [facility];  Service: Endoscopy;  Laterality: N/A;  . excision sebaceous cyst  11/2013   posterior scalp  . HEMATOMA EVACUATION     vaginal hematoma 05/23/02  . shoulder surgery     sebacceous cyst removal  . UTERINE SUSPENSION     Social History   Social History Narrative   Single, children, works as a Recruitment consultant.  On disability (fibromyalgia, heart, depression/anxiety)    Objective: Vital Signs: BP 108/69 (BP Location: Left Arm, Patient Position: Sitting, Cuff Size: Normal)   Pulse 66   Resp 13   Ht 5\' 6"  (1.676 m)   Wt 165 lb (74.8 kg)   BMI 26.63 kg/m    Physical Exam  Constitutional: She is oriented to person, place, and time. She appears well-developed and well-nourished.  HENT:  Head: Normocephalic and atraumatic.  Eyes: Conjunctivae and EOM are normal.  Neck: Normal range of motion.  Cardiovascular: Normal rate, regular rhythm, normal heart sounds and intact distal pulses.  Pulmonary/Chest: Effort normal and breath sounds normal.  Abdominal: Soft. Bowel sounds are normal.  Lymphadenopathy:    She has no cervical adenopathy.  Neurological: She is alert and oriented to person, place, and time.  Skin: Skin is warm and dry. Capillary refill takes less than 2 seconds.  Psychiatric: She has a normal mood and affect. Her behavior is normal.  Nursing note and vitals reviewed.    Musculoskeletal Exam: Generalized hyperalgesia. C-spine, thoracic spine, and lumbar spine good ROM.  No midline spinal tenderness.  No SI joint tenderness.  Shoulder joints  elbow joints wrist joint MCPs PIPs DIPs were in good range of motion with no synovitis.  She has some tenderness over right medial epicondyle area.  Hip joints, knee joints, ankles and MTPs were in good range of motion with no synovitis.  She has positive tender points.  CDAI Exam: CDAI Score: Not documented Patient Global Assessment: Not documented; Provider Global Assessment: Not documented Swollen: Not documented; Tender: Not documented Joint Exam   Not documented   There is currently no information documented on the homunculus. Go to the Rheumatology activity and complete the homunculus joint exam.  Investigation: Findings:  We received MRI report of her right elbow joint from February 22, 2018 (from  [facility]) according to report patient has mild insertional triceps tendinosis.  No synovitis was noted.   Imaging: No results found.  Recent Labs: Lab Results  Component Value Date   WBC 4.5 08/28/2017   HGB 12.2 08/28/2017   PLT 309 08/28/2017   NA 144 08/28/2017   K 4.9 08/28/2017   CL 103 08/28/2017   CO2 23 08/28/2017   GLUCOSE 96 08/28/2017   BUN 15 08/28/2017   CREATININE 0.91 08/28/2017  BILITOT 0.4 08/28/2017   ALKPHOS 121 (H) 08/28/2017   AST 19 08/28/2017   ALT 12 08/28/2017   PROT 7.8 08/28/2017   ALBUMIN 4.5 08/28/2017   CALCIUM 9.6 08/28/2017   GFRAA 81 08/28/2017    Speciality Comments: No specialty comments available.  Procedures:  No procedures performed Allergies: Savella [milnacipran hcl]; Bee venom; Codeine; and Ivp dye [iodinated diagnostic agents]   Assessment / Plan:     Visit Diagnoses: Myofascial pain syndrome-she continues to have some generalized pain and discomfort.  Primary osteoarthritis of both hands-she has some osteoarthritic changes without much discomfort.  Primary osteoarthritis of both knees-she has chronic pain in her knee joints.  Primary insomnia-good sleep hygiene discussed.  Pain in right elbow -I did not  appreciate any warmth or swelling.  She had tenderness over the medial aspect could be consistent with medial epicondylitis.  Patient states that she has been having intermittent swelling.  She also had MRI done by Dr. Gladstone Lighter.  We will request copy of the MRI.  I will obtain following labs to look for any underlying inflammatory process.  I have also given her prescription for tennis elbow brace.  I offered physical therapy which she declined.  Plan: Uric acid, Angiotensin converting enzyme, 14-3-3 eta Protein, Rheumatoid factor, Cyclic citrul peptide antibody, IgG, Sedimentation rate, ANA, CK  Other medical problems are listed as follows: History of hypertension  History of migraine  History of vitamin D deficiency  Pedal edema  History of hypercholesterolemia   Orders: Orders Placed This Encounter  Procedures  . Uric acid  . Angiotensin converting enzyme  . 14-3-3 eta Protein  . Rheumatoid factor  . Cyclic citrul peptide antibody, IgG  . Sedimentation rate  . ANA  . CK   No orders of the defined types were placed in this encounter.   Face-to-face time spent with patient was 30 minutes. Greater than 50% of time was spent in counseling and coordination of care.  Follow-Up Instructions: Return in about 4 weeks (around 05/14/2018) for Myofascial pain syndrome, Osteoarthritis.   Bo Merino, MD  Note - This record has been created using Editor, commissioning.  Chart creation errors have been sought, but may not always  have been located. Such creation errors do not reflect on  the standard of medical care.

## 2018-04-16 ENCOUNTER — Telehealth: Payer: Self-pay | Admitting: Rheumatology

## 2018-04-16 ENCOUNTER — Encounter: Payer: Self-pay | Admitting: Rheumatology

## 2018-04-16 ENCOUNTER — Ambulatory Visit (INDEPENDENT_AMBULATORY_CARE_PROVIDER_SITE_OTHER): Payer: Medicare Other | Admitting: Rheumatology

## 2018-04-16 VITALS — BP 108/69 | HR 66 | Resp 13 | Ht 66.0 in | Wt 165.0 lb

## 2018-04-16 DIAGNOSIS — M170 Bilateral primary osteoarthritis of knee: Secondary | ICD-10-CM | POA: Diagnosis not present

## 2018-04-16 DIAGNOSIS — F5101 Primary insomnia: Secondary | ICD-10-CM | POA: Diagnosis not present

## 2018-04-16 DIAGNOSIS — Z8669 Personal history of other diseases of the nervous system and sense organs: Secondary | ICD-10-CM

## 2018-04-16 DIAGNOSIS — M25521 Pain in right elbow: Secondary | ICD-10-CM

## 2018-04-16 DIAGNOSIS — Z8679 Personal history of other diseases of the circulatory system: Secondary | ICD-10-CM

## 2018-04-16 DIAGNOSIS — R600 Localized edema: Secondary | ICD-10-CM

## 2018-04-16 DIAGNOSIS — M19041 Primary osteoarthritis, right hand: Secondary | ICD-10-CM | POA: Diagnosis not present

## 2018-04-16 DIAGNOSIS — Z8639 Personal history of other endocrine, nutritional and metabolic disease: Secondary | ICD-10-CM

## 2018-04-16 DIAGNOSIS — M7918 Myalgia, other site: Secondary | ICD-10-CM

## 2018-04-16 DIAGNOSIS — M19042 Primary osteoarthritis, left hand: Secondary | ICD-10-CM

## 2018-04-16 NOTE — Telephone Encounter (Signed)
Per patient, Dr. Phill Myron office wants to charge patient to have a copy of xrays/mri/notes to be sent here. Per patient if we call, and request records they will send them without a charge. Patient was not told to sign a release form though. Please advise.

## 2018-04-17 NOTE — Telephone Encounter (Signed)
Patient advised she will need to come to our office to sign a release of records form so we may request records.

## 2018-04-20 LAB — ANA: Anti Nuclear Antibody(ANA): NEGATIVE

## 2018-04-20 LAB — CK: Total CK: 134 U/L (ref 29–143)

## 2018-04-20 LAB — URIC ACID: URIC ACID, SERUM: 4.1 mg/dL (ref 2.5–7.0)

## 2018-04-20 LAB — RHEUMATOID FACTOR

## 2018-04-20 LAB — CYCLIC CITRUL PEPTIDE ANTIBODY, IGG: CYCLIC CITRULLIN PEPTIDE AB: 80 U — AB

## 2018-04-20 LAB — 14-3-3 ETA PROTEIN: 14-3-3 eta Protein: <0.2 ng/mL (ref <0.2)

## 2018-04-20 LAB — SEDIMENTATION RATE: Sed Rate: 19 mm/h (ref 0–30)

## 2018-04-20 LAB — ANGIOTENSIN CONVERTING ENZYME: Angiotensin-Converting Enzyme: 32 U/L (ref 9–67)

## 2018-04-22 NOTE — Progress Notes (Signed)
Uric acid WNL. ACE WNL. Sed rate WNL-19. ANA negative. CK WNL. 14-3-3 eta negative. RF negative. CCP is positive.

## 2018-04-22 NOTE — Progress Notes (Signed)
Please notify patient and let her know we will discuss further at follow up visit on 05/06/18.

## 2018-04-23 DIAGNOSIS — M797 Fibromyalgia: Secondary | ICD-10-CM | POA: Diagnosis not present

## 2018-04-24 NOTE — Progress Notes (Signed)
Office Visit Note  Patient: Gina Espinoza             Date of Birth: 16-Apr-1961           MRN: [id number]             PCP: Denita Lung, MD Referring: Denita Lung, MD Visit Date: 05/06/2018 Occupation: @GUAROCC @  Subjective:  Neck and shoulder pain stiffness. Right elbow pain.   History of Present Illness: Gina Espinoza is a 57 y.o. female history of myofascial pain osteoarthritis.  She states she has been having increasing stiffness in her neck and right shoulder.  She has been also having pain in her right elbow.  She has difficulty bending her right elbow and also extending her arm.  She had MRI done by Dr. Gladstone Lighter which we received a copy, which showed mild triceps tendinosis.  No synovitis was noted.  She has noticed decreased grip strength in her right hand.  She is having difficulty holding her cane.  Activities of Daily Living:  Patient reports morning stiffness for 4-5 hours.   Patient Reports nocturnal pain.  Difficulty dressing/grooming: Denies Difficulty climbing stairs: Reports Difficulty getting out of chair: Reports Difficulty using hands for taps, buttons, cutlery, and/or writing: Reports  Review of Systems  Constitutional: Positive for fatigue. Negative for night sweats, weight gain and weight loss.  HENT: Positive for mouth dryness. Negative for mouth sores, trouble swallowing, trouble swallowing and nose dryness.   Eyes: Positive for dryness. Negative for pain, redness and visual disturbance.  Respiratory: Negative for cough, shortness of breath and difficulty breathing.   Cardiovascular: Negative for chest pain, palpitations, hypertension, irregular heartbeat and swelling in legs/feet.  Gastrointestinal: Negative for blood in stool, constipation and diarrhea.  Endocrine: Negative for increased urination.  Genitourinary: Negative for vaginal dryness.  Musculoskeletal: Positive for arthralgias, joint pain, myalgias, morning stiffness and myalgias.  Negative for joint swelling, muscle weakness and muscle tenderness.  Skin: Negative for color change, rash, hair loss, skin tightness, ulcers and sensitivity to sunlight.  Allergic/Immunologic: Negative for susceptible to infections.  Neurological: Negative for dizziness, memory loss, night sweats and weakness.  Hematological: Negative for swollen glands.  Psychiatric/Behavioral: Positive for depressed mood and sleep disturbance. The patient is nervous/anxious.     PMFS History:  Patient Active Problem List   Diagnosis Date Noted  . Abdominal bloating 08/27/2017  . Nausea 08/27/2017  . 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 Depression, major, recurrent, moderate ([place]) 05/28/2015  . Abdominal pain 02/10/2013  . Constipation 02/10/2013  . Fibromyalgia syndrome 11/22/2011  . Headache 11/20/2008  . Hyperlipidemia LDL goal <100 11/02/2008  . Essential hypertension 11/02/2008  . Chest pain 11/02/2008  . History of cardiovascular disorder 11/02/2008    Past Medical History:  Diagnosis Date  . Anxiety   . Chest pain, unspecified   . Cough   . Depression   . Fibromyalgia   . Headache(784.0)   . Other and unspecified hyperlipidemia    mixed  . Personal history of unspecified circulatory disease   . Unspecified essential hypertension     Family History  Problem Relation Age of Onset  . Allergies Mother        also children  . Heart disease Father   . Asthma Daughter   . Breast cancer Maternal Grandmother   . Colon cancer Brother   . Asthma Grandchild    Past Surgical History:  Procedure Laterality Date  .  anterior perineoplasty     posterior repair. 05/16/02  . BLADDER SUSPENSION    . CARDIAC CATHETERIZATION  2006   normal coronaries  . ESOPHAGOGASTRODUODENOSCOPY N/A 02/09/2013   Procedure: ESOPHAGOGASTRODUODENOSCOPY (EGD);  Surgeon: Milus Banister, MD;  Location: [facility];  Service: Endoscopy;  Laterality: N/A;  . excision  sebaceous cyst  11/2013   posterior scalp  . HEMATOMA EVACUATION     vaginal hematoma 05/23/02  . shoulder surgery     sebacceous cyst removal  . UTERINE SUSPENSION     Social History   Social History Narrative   Single, children, works as a Recruitment consultant.  On disability (fibromyalgia, heart, depression/anxiety)    Objective: Vital Signs: BP 124/77 (BP Location: Left Arm, Patient Position: Sitting, Cuff Size: Normal)   Pulse 64   Resp 14   Ht 5\' 6"  (1.676 m)   Wt 164 lb (74.4 kg)   BMI 26.47 kg/m    Physical Exam Vitals signs and nursing note reviewed.  Constitutional:      Appearance: She is well-developed.  HENT:     Head: Normocephalic and atraumatic.  Eyes:     Conjunctiva/sclera: Conjunctivae normal.  Neck:     Musculoskeletal: Normal range of motion.  Cardiovascular:     Rate and Rhythm: Normal rate and regular rhythm.     Heart sounds: Normal heart sounds.  Pulmonary:     Effort: Pulmonary effort is normal.     Breath sounds: Normal breath sounds.  Abdominal:     General: Bowel sounds are normal.     Palpations: Abdomen is soft.  Lymphadenopathy:     Cervical: No cervical adenopathy.  Skin:    General: Skin is warm and dry.     Capillary Refill: Capillary refill takes less than 2 seconds.  Neurological:     Mental Status: She is alert and oriented to person, place, and time.  Psychiatric:        Behavior: Behavior normal.      Musculoskeletal Exam: C-spine thoracic lumbar spine good range of motion.  She had discomfort lifting her right arm but no effusion or warmth was noted.  She has discomfort with range of motion of her right elbow without any warmth swelling or effusion.  She has some tenderness over right brachioradialis muscle.  Wrist joints, MCPs, PIPs DIPs been good range of motion with no synovitis.  Hip joints knee joints ankles MTPs PIPs been good range of motion with no synovitis.  CDAI Exam: CDAI Score: Not documented Patient Global Assessment:  Not documented; Provider Global Assessment: Not documented Swollen: Not documented; Tender: Not documented Joint Exam   Not documented   There is currently no information documented on the homunculus. Go to the Rheumatology activity and complete the homunculus joint exam.  Investigation: No additional findings.  Imaging: No results found.  Recent Labs: Lab Results  Component Value Date   WBC 4.5 08/28/2017   HGB 12.2 08/28/2017   PLT 309 08/28/2017   NA 144 08/28/2017   K 4.9 08/28/2017   CL 103 08/28/2017   CO2 23 08/28/2017   GLUCOSE 96 08/28/2017   BUN 15 08/28/2017   CREATININE 0.91 08/28/2017   BILITOT 0.4 08/28/2017   ALKPHOS 121 (H) 08/28/2017   AST 19 08/28/2017   ALT 12 08/28/2017   PROT 7.8 08/28/2017   ALBUMIN 4.5 08/28/2017   CALCIUM 9.6 08/28/2017   GFRAA 81 08/28/2017    Speciality Comments: No specialty comments available.  Procedures:  No  procedures performed Allergies: Savella [milnacipran hcl]; Bee venom; Codeine; and Ivp dye [iodinated diagnostic agents]   Assessment / Plan:     Visit Diagnoses: Myofascial pain syndrome-she continues to have some generalized pain discomfort and positive tender points.  Neck pain she has been having experiencing neck is stiffness and discomfort.  After detailed discussion she was convinced to go to physical therapy.  I have given her prescription for physical therapy.  Tendinopathy of right shoulder-she has discomfort in her bilateral shoulders but more so in the right shoulder.  She has pain with internal rotation.  Her symptoms are consistent with tendinopathy.  She was referred to physical therapy for right shoulder joint tendinopathy.  She may benefit from iontophoresis.  Pain in right elbow - she has been complaining of right elbow joint discomfort and pain.  She had tenderness on palpation of her right brachioradialis.  MRI done by Dr. Gladstone Lighter revealed mild triceps tendinosis.  Referral for physical therapy  was made.  Positive anti-CCP test-patient has no synovitis on examination.  Her sed rate done in November was normal.  Primary osteoarthritis of both hands-she has mild osteoarthritic changes in her hands.  Primary osteoarthritis of both knees-she had discomfort in her bilateral knee joints.  She had been to physical therapy in the past.  She has been doing some knee exercises at home now.  Other medical problems are listed as follows:  Primary insomnia  History of hypercholesterolemia  History of vitamin D deficiency  History of hypertension  History of migraine  Pedal edema   Orders: Orders Placed This Encounter  Procedures  . Ambulatory referral to Physical Therapy   Meds ordered this encounter  Medications  . diclofenac sodium (VOLTAREN) 1 % GEL    Sig: 3 grams to 3 large joints up to 3 times daily    Dispense:  3 Tube    Refill:  3     Follow-Up Instructions: Return in about 6 months (around 11/05/2018) for MFPS, OA.   Bo Merino, MD  Note - This record has been created using Editor, commissioning.  Chart creation errors have been sought, but may not always  have been located. Such creation errors do not reflect on  the standard of medical care.

## 2018-05-06 ENCOUNTER — Ambulatory Visit (INDEPENDENT_AMBULATORY_CARE_PROVIDER_SITE_OTHER): Payer: Medicare Other | Admitting: Rheumatology

## 2018-05-06 ENCOUNTER — Encounter: Payer: Self-pay | Admitting: Rheumatology

## 2018-05-06 VITALS — BP 124/77 | HR 64 | Resp 14 | Ht 66.0 in | Wt 164.0 lb

## 2018-05-06 DIAGNOSIS — M7918 Myalgia, other site: Secondary | ICD-10-CM

## 2018-05-06 DIAGNOSIS — M67911 Unspecified disorder of synovium and tendon, right shoulder: Secondary | ICD-10-CM | POA: Diagnosis not present

## 2018-05-06 DIAGNOSIS — M25521 Pain in right elbow: Secondary | ICD-10-CM

## 2018-05-06 DIAGNOSIS — R600 Localized edema: Secondary | ICD-10-CM

## 2018-05-06 DIAGNOSIS — R768 Other specified abnormal immunological findings in serum: Secondary | ICD-10-CM

## 2018-05-06 DIAGNOSIS — M19042 Primary osteoarthritis, left hand: Secondary | ICD-10-CM

## 2018-05-06 DIAGNOSIS — M170 Bilateral primary osteoarthritis of knee: Secondary | ICD-10-CM

## 2018-05-06 DIAGNOSIS — M542 Cervicalgia: Secondary | ICD-10-CM

## 2018-05-06 DIAGNOSIS — Z8679 Personal history of other diseases of the circulatory system: Secondary | ICD-10-CM

## 2018-05-06 DIAGNOSIS — M19041 Primary osteoarthritis, right hand: Secondary | ICD-10-CM

## 2018-05-06 DIAGNOSIS — Z8639 Personal history of other endocrine, nutritional and metabolic disease: Secondary | ICD-10-CM

## 2018-05-06 DIAGNOSIS — Z8669 Personal history of other diseases of the nervous system and sense organs: Secondary | ICD-10-CM

## 2018-05-06 DIAGNOSIS — F5101 Primary insomnia: Secondary | ICD-10-CM

## 2018-05-06 MED ORDER — DICLOFENAC SODIUM 1 % TD GEL: TRANSDERMAL | 3 refills | Status: AC

## 2018-05-19 ENCOUNTER — Other Ambulatory Visit: Payer: Self-pay | Admitting: Gastroenterology

## 2018-05-30 DIAGNOSIS — M797 Fibromyalgia: Secondary | ICD-10-CM | POA: Diagnosis not present

## 2018-06-17 ENCOUNTER — Other Ambulatory Visit: Payer: Self-pay | Admitting: Gastroenterology

## 2018-06-24 DIAGNOSIS — M797 Fibromyalgia: Secondary | ICD-10-CM | POA: Diagnosis not present

## 2018-06-24 DIAGNOSIS — G8929 Other chronic pain: Secondary | ICD-10-CM | POA: Diagnosis not present

## 2018-07-11 DIAGNOSIS — M5412 Radiculopathy, cervical region: Secondary | ICD-10-CM | POA: Diagnosis not present

## 2018-07-26 DIAGNOSIS — G8929 Other chronic pain: Secondary | ICD-10-CM | POA: Diagnosis not present

## 2018-07-26 DIAGNOSIS — M797 Fibromyalgia: Secondary | ICD-10-CM | POA: Diagnosis not present

## 2018-08-05 ENCOUNTER — Telehealth: Payer: Self-pay | Admitting: Gastroenterology

## 2018-08-05 ENCOUNTER — Other Ambulatory Visit: Payer: Self-pay | Admitting: Gastroenterology

## 2018-08-05 MED ORDER — LINACLOTIDE 290 MCG PO CAPS: 290.0000 ug | ORAL_CAPSULE | Freq: Every day | ORAL | 3 refills | Status: DC

## 2018-08-05 NOTE — Telephone Encounter (Signed)
Sent in rx to pharmacy patient notified.

## 2018-08-05 NOTE — Telephone Encounter (Signed)
Pt needs rf for Linzess sent to [facility] on [address].

## 2018-08-21 DIAGNOSIS — M797 Fibromyalgia: Secondary | ICD-10-CM | POA: Diagnosis not present

## 2018-08-21 DIAGNOSIS — R110 Nausea: Secondary | ICD-10-CM | POA: Diagnosis not present

## 2018-09-10 ENCOUNTER — Encounter: Payer: Medicare Other | Admitting: Family Medicine

## 2018-09-17 ENCOUNTER — Other Ambulatory Visit: Payer: Self-pay | Admitting: Family Medicine

## 2018-09-17 DIAGNOSIS — R079 Chest pain, unspecified: Secondary | ICD-10-CM

## 2018-09-18 NOTE — Telephone Encounter (Signed)
Is this okay to refill? 

## 2018-09-23 ENCOUNTER — Encounter: Payer: Self-pay | Admitting: Family Medicine

## 2018-09-23 ENCOUNTER — Ambulatory Visit (INDEPENDENT_AMBULATORY_CARE_PROVIDER_SITE_OTHER): Payer: Medicare Other | Admitting: Family Medicine

## 2018-09-23 ENCOUNTER — Other Ambulatory Visit: Payer: Self-pay

## 2018-09-23 ENCOUNTER — Encounter: Payer: Medicare Other | Admitting: Family Medicine

## 2018-09-23 VITALS — BP 112/76 | HR 54 | Temp 98.2°F | Wt 172.6 lb

## 2018-09-23 DIAGNOSIS — E785 Hyperlipidemia, unspecified: Secondary | ICD-10-CM

## 2018-09-23 DIAGNOSIS — I10 Essential (primary) hypertension: Secondary | ICD-10-CM | POA: Diagnosis not present

## 2018-09-23 DIAGNOSIS — M797 Fibromyalgia: Secondary | ICD-10-CM

## 2018-09-23 DIAGNOSIS — K5900 Constipation, unspecified: Secondary | ICD-10-CM

## 2018-09-23 DIAGNOSIS — J302 Other seasonal allergic rhinitis: Secondary | ICD-10-CM

## 2018-09-23 DIAGNOSIS — F331 Major depressive disorder, recurrent, moderate: Secondary | ICD-10-CM | POA: Diagnosis not present

## 2018-09-23 DIAGNOSIS — R079 Chest pain, unspecified: Secondary | ICD-10-CM

## 2018-09-23 DIAGNOSIS — Z8679 Personal history of other diseases of the circulatory system: Secondary | ICD-10-CM

## 2018-09-23 DIAGNOSIS — Z0000 Encounter for general adult medical examination without abnormal findings: Secondary | ICD-10-CM | POA: Diagnosis not present

## 2018-09-23 LAB — POCT URINALYSIS DIP (PROADVANTAGE DEVICE)
Bilirubin, UA: NEGATIVE
Blood, UA: NEGATIVE
Glucose, UA: NEGATIVE mg/dL
Ketones, POC UA: NEGATIVE mg/dL
Nitrite, UA: NEGATIVE
Protein Ur, POC: NEGATIVE mg/dL
Specific Gravity, Urine: 1.03
Urobilinogen, Ur: 3.5
pH, UA: 5.5 (ref 5.0–8.0)

## 2018-09-23 MED ORDER — BUDESONIDE 32 MCG/ACT NA SUSP: 2.0000 | Freq: Every day | NASAL | 3 refills | Status: DC

## 2018-09-23 MED ORDER — ROSUVASTATIN CALCIUM 5 MG PO TABS: ORAL_TABLET | ORAL | 3 refills | Status: DC

## 2018-09-23 MED ORDER — AMLODIPINE BESYLATE 2.5 MG PO TABS: ORAL_TABLET | ORAL | 3 refills | Status: DC

## 2018-09-23 MED ORDER — LEVOCETIRIZINE DIHYDROCHLORIDE 5 MG PO TABS: ORAL_TABLET | ORAL | 3 refills | Status: DC

## 2018-09-23 MED ORDER — NITROGLYCERIN 0.4 MG SL SUBL: SUBLINGUAL_TABLET | SUBLINGUAL | 0 refills | Status: DC

## 2018-09-23 NOTE — Progress Notes (Signed)
Subjective:    Patient ID: Gina Espinoza, female    DOB: August 19, 1960, 58 y.o.   MRN: [id number]  HPI She is here for a complete examination.  She has had about 3 episodes of left-sided chest pain with radiation into her back.  She states that she did take nitroglycerin and it did help with that.  Review of the record indicates she had seen Dr. Verl Blalock in the past with the presumed diagnosis of coronary spasm.  She was last seen by cardiology in 2014.  She is followed by her rheumatologist for her underlying fibromyalgia which does cause lots of aches and pains with her.  Her allergies seem to be under fairly decent control.  She does complain of right great toe pain but at the present time is not having much difficulty with that.  She continues have intermittent difficulty with constipation and is followed by gastroenterology for that.  She is taking Linzess.  She does complain of intermittent low back pain but difficult to figure out whether this is separate from her fibromyalgia or another issue.  She has a history of depression but presently is not on any medications for that.   Review of Systems  All other systems reviewed and are negative.      Objective:   Physical Exam BP 112/76 (BP Location: Left Arm, Patient Position: Sitting)   Pulse (!) 54   Temp 98.2 F (36.8 C)   Wt 172 lb 9.6 oz (78.3 kg)   SpO2 98%   BMI 27.86 kg/m   General Appearance:    Alert, cooperative, no distress, appears stated age  Head:    Normocephalic, without obvious abnormality, atraumatic  Eyes:    PERRL, conjunctiva/corneas clear, EOM's intact, fundi    benign  Ears:    Normal TM's and external ear canals  Nose:   Nares normal, mucosa normal, no drainage or sinus   tenderness  Throat:   Lips, mucosa, and tongue normal; teeth and gums normal  Neck:   Supple, no lymphadenopathy;  thyroid:  no   enlargement/tenderness/nodules; no carotid   bruit or JVD     Lungs:     Clear to auscultation bilaterally  without wheezes, rales or     ronchi; respirations unlabored  Chest Wall:    No tenderness or deformity   Heart:    Regular rate and rhythm, S1 and S2 normal, no murmur, rub   or gallop     Abdomen:     Soft, non-tender, nondistended, normoactive bowel sounds,    no masses, no hepatosplenomegaly        Extremities:   No clubbing, cyanosis or edema  Pulses:   2+ and symmetric all extremities  Skin:   Skin color, texture, turgor normal, no rashes or lesions  Lymph nodes:   Cervical, supraclavicular, and axillary nodes normal  Neurologic:   CNII-XII intact, normal strength, sensation and gait; reflexes 2+ and symmetric throughout          Psych:   Normal mood, affect, hygiene and grooming.    EKG is negative    Assessment & Plan:  Routine general medical examination at a health care facility - Plan: CBC with Differential/Platelet, Comprehensive metabolic panel, Lipid panel  Chest pain, unspecified type - Plan: EKG 12-Lead, Ambulatory referral to Cardiology, nitroGLYCERIN (NITROSTAT) 0.4 MG SL tablet  Constipation, unspecified constipation type  Depression, major, recurrent, moderate (HCC)  Essential hypertension - Plan: CBC with Differential/Platelet, Comprehensive metabolic panel, amLODipine (NORVASC) 2.5  MG tablet  History of cardiovascular disorder  Hyperlipidemia LDL goal <100 - Plan: Lipid panel, rosuvastatin (CRESTOR) 5 MG tablet  Fibromyalgia syndrome  Chronic seasonal allergic rhinitis - Plan: levocetirizine (XYZAL) 5 MG tablet  She will continue on her present medication regimen.  I will add Rhinocort to her regimen to see if this will give her a little extra help with her allergies.  Since she has had some difficulty with chest pain, I think cardiology evaluation would be prudent.  Overall she seems to be holding her own.

## 2018-09-23 NOTE — Patient Instructions (Signed)
Stay on all your present medications.  Let see what cardiology has to say.

## 2018-09-23 NOTE — Addendum Note (Signed)
Addended by: Elyse Jarvis on: 09/23/2018 01:24 PM   Modules accepted: Orders

## 2018-09-24 LAB — LIPID PANEL
Chol/HDL Ratio: 2.9 ratio (ref 0.0–4.4)
Cholesterol, Total: 159 mg/dL (ref 100–199)
HDL: 55 mg/dL (ref >39)
LDL Calculated: 92 mg/dL (ref 0–99)
Triglycerides: 58 mg/dL (ref 0–149)
VLDL Cholesterol Cal: 12 mg/dL (ref 5–40)

## 2018-09-24 LAB — COMPREHENSIVE METABOLIC PANEL
ALT: 13 IU/L (ref 0–32)
AST: 24 IU/L (ref 0–40)
Albumin/Globulin Ratio: 1.5 (ref 1.2–2.2)
Albumin: 4.4 g/dL (ref 3.8–4.9)
Alkaline Phosphatase: 123 IU/L — ABNORMAL HIGH (ref 39–117)
BUN/Creatinine Ratio: 18 (ref 9–23)
BUN: 17 mg/dL (ref 6–24)
Bilirubin Total: 0.3 mg/dL (ref 0.0–1.2)
CO2: 26 mmol/L (ref 20–29)
Calcium: 9.4 mg/dL (ref 8.7–10.2)
Chloride: 104 mmol/L (ref 96–106)
Creatinine, Ser: 0.92 mg/dL (ref 0.57–1.00)
GFR calc Af Amer: 79 mL/min/{1.73_m2} (ref >59)
GFR calc non Af Amer: 69 mL/min/{1.73_m2} (ref >59)
Globulin, Total: 2.9 g/dL (ref 1.5–4.5)
Glucose: 98 mg/dL (ref 65–99)
Potassium: 4.7 mmol/L (ref 3.5–5.2)
Sodium: 142 mmol/L (ref 134–144)
Total Protein: 7.3 g/dL (ref 6.0–8.5)

## 2018-09-24 LAB — CBC WITH DIFFERENTIAL/PLATELET
Basophils Absolute: 0 10*3/uL (ref 0.0–0.2)
Basos: 1 %
EOS (ABSOLUTE): 0.1 10*3/uL (ref 0.0–0.4)
Eos: 1 %
Hematocrit: 38.5 % (ref 34.0–46.6)
Hemoglobin: 12.1 g/dL (ref 11.1–15.9)
Immature Grans (Abs): 0 10*3/uL (ref 0.0–0.1)
Immature Granulocytes: 0 %
Lymphocytes Absolute: 2.2 10*3/uL (ref 0.7–3.1)
Lymphs: 44 %
MCH: 24.7 pg — ABNORMAL LOW (ref 26.6–33.0)
MCHC: 31.4 g/dL — ABNORMAL LOW (ref 31.5–35.7)
MCV: 79 fL (ref 79–97)
Monocytes Absolute: 0.4 10*3/uL (ref 0.1–0.9)
Monocytes: 8 %
Neutrophils Absolute: 2.3 10*3/uL (ref 1.4–7.0)
Neutrophils: 46 %
Platelets: 325 10*3/uL (ref 150–450)
RBC: 4.9 x10E6/uL (ref 3.77–5.28)
RDW: 14.5 % (ref 11.7–15.4)
WBC: 5 10*3/uL (ref 3.4–10.8)

## 2018-10-02 ENCOUNTER — Other Ambulatory Visit: Payer: Self-pay | Admitting: Family Medicine

## 2018-10-02 DIAGNOSIS — J302 Other seasonal allergic rhinitis: Secondary | ICD-10-CM

## 2018-10-02 DIAGNOSIS — J309 Allergic rhinitis, unspecified: Secondary | ICD-10-CM

## 2018-10-04 DIAGNOSIS — M797 Fibromyalgia: Secondary | ICD-10-CM | POA: Diagnosis not present

## 2018-10-04 DIAGNOSIS — G8929 Other chronic pain: Secondary | ICD-10-CM | POA: Diagnosis not present

## 2018-10-15 ENCOUNTER — Other Ambulatory Visit: Payer: Self-pay | Admitting: Family Medicine

## 2018-10-15 DIAGNOSIS — R079 Chest pain, unspecified: Secondary | ICD-10-CM

## 2018-10-22 ENCOUNTER — Other Ambulatory Visit: Payer: Self-pay

## 2018-10-22 ENCOUNTER — Ambulatory Visit: Payer: Medicare Other | Admitting: Family Medicine

## 2018-10-22 NOTE — Progress Notes (Deleted)
Office Visit Note  Patient: Gina Espinoza             Date of Birth: Aug 19, 1960           MRN: [id number]             PCP: Denita Lung, MD Referring: Denita Lung, MD Visit Date: 11/05/2018 Occupation: '@GUAROCC'$ @  Subjective:  No chief complaint on file.   History of Present Illness: Gina Espinoza is a 58 y.o. female ***   Activities of Daily Living:  Patient reports morning stiffness for *** {minute/hour:19697}.   Patient {ACTIONS;DENIES/REPORTS:21021675::"Denies"} nocturnal pain.  Difficulty dressing/grooming: {ACTIONS;DENIES/REPORTS:21021675::"Denies"} Difficulty climbing stairs: {ACTIONS;DENIES/REPORTS:21021675::"Denies"} Difficulty getting out of chair: {ACTIONS;DENIES/REPORTS:21021675::"Denies"} Difficulty using hands for taps, buttons, cutlery, and/or writing: {ACTIONS;DENIES/REPORTS:21021675::"Denies"}  No Rheumatology ROS completed.   PMFS History:  Patient Active Problem List   Diagnosis Date Noted  . Abdominal bloating 08/27/2017  . Nausea 08/27/2017  . 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 Depression, major, recurrent, moderate ([place]) 05/28/2015  . Abdominal pain 02/10/2013  . Constipation 02/10/2013  . Fibromyalgia syndrome 11/22/2011  . Headache 11/20/2008  . Hyperlipidemia LDL goal <100 11/02/2008  . Essential hypertension 11/02/2008  . Chest pain 11/02/2008  . History of cardiovascular disorder 11/02/2008    Past Medical History:  Diagnosis Date  . Anxiety   . Chest pain, unspecified   . Cough   . Depression   . Fibromyalgia   . Headache(784.0)   . Other and unspecified hyperlipidemia    mixed  . Personal history of unspecified circulatory disease   . Unspecified essential hypertension     Family History  Problem Relation Age of Onset  . Allergies Mother        also children  . Heart disease Father   . Asthma Daughter   . Breast cancer Maternal Grandmother   . Colon cancer Brother   . Asthma  Grandchild    Past Surgical History:  Procedure Laterality Date  . anterior perineoplasty     posterior repair. 05/16/02  . BLADDER SUSPENSION    . CARDIAC CATHETERIZATION  2006   normal coronaries  . ESOPHAGOGASTRODUODENOSCOPY N/A 02/09/2013   Procedure: ESOPHAGOGASTRODUODENOSCOPY (EGD);  Surgeon: Milus Banister, MD;  Location: [facility];  Service: Endoscopy;  Laterality: N/A;  . excision sebaceous cyst  11/2013   posterior scalp  . HEMATOMA EVACUATION     vaginal hematoma 05/23/02  . shoulder surgery     sebacceous cyst removal  . UTERINE SUSPENSION     Social History   Social History Narrative   Single, children, works as a Recruitment consultant.  On disability (fibromyalgia, heart, depression/anxiety)   Immunization History  Administered Date(s) Administered  . Hepatitis B 03/10/2008, 04/10/2008, 09/08/2008  . Influenza,inj,Quad PF,6+ Mos 02/11/2013, 06/18/2014, 06/08/2015, 02/24/2016  . MMR 03/10/2008, 04/10/2008  . Td 09/23/2018  . Tdap 03/10/2008  . Varicella 02/28/2008, 04/10/2008  . Zoster Recombinat (Shingrix) 11/17/2016     Objective: Vital Signs: There were no vitals taken for this visit.   Physical Exam   Musculoskeletal Exam: ***  CDAI Exam: CDAI Score: Not documented Patient Global Assessment: Not documented; Provider Global Assessment: Not documented Swollen: Not documented; Tender: Not documented Joint Exam   Not documented   There is currently no information documented on the homunculus. Go to the Rheumatology activity and complete the homunculus joint exam.  Investigation: No additional findings.  Imaging: No results found.  Recent Labs: Lab Results  Component Value Date   WBC 5.0 09/23/2018   HGB 12.1 09/23/2018   PLT 325 09/23/2018   NA 142 09/23/2018   K 4.7 09/23/2018   CL 104 09/23/2018   CO2 26 09/23/2018   GLUCOSE 98 09/23/2018   BUN 17 09/23/2018   CREATININE 0.92 09/23/2018   BILITOT 0.3 09/23/2018   ALKPHOS 123 (H) 09/23/2018    AST 24 09/23/2018   ALT 13 09/23/2018   PROT 7.3 09/23/2018   ALBUMIN 4.4 09/23/2018   CALCIUM 9.4 09/23/2018   GFRAA 79 09/23/2018    Speciality Comments: No specialty comments available.  Procedures:  No procedures performed Allergies: Savella [milnacipran hcl]; Bee venom; Codeine; and Ivp dye [iodinated diagnostic agents]   Assessment / Plan:     Visit Diagnoses: No diagnosis found.   Orders: No orders of the defined types were placed in this encounter.  No orders of the defined types were placed in this encounter.   Face-to-face time spent with patient was *** minutes. Greater than 50% of time was spent in counseling and coordination of care.  Follow-Up Instructions: No follow-ups on file.   Earnestine Mealing, CMA  Note - This record has been created using Editor, commissioning.  Chart creation errors have been sought, but may not always  have been located. Such creation errors do not reflect on  the standard of medical care.

## 2018-11-05 ENCOUNTER — Ambulatory Visit: Payer: Self-pay | Admitting: Rheumatology

## 2018-11-12 DIAGNOSIS — M797 Fibromyalgia: Secondary | ICD-10-CM | POA: Diagnosis not present

## 2018-12-12 DIAGNOSIS — M797 Fibromyalgia: Secondary | ICD-10-CM | POA: Diagnosis not present

## 2018-12-12 DIAGNOSIS — N390 Urinary tract infection, site not specified: Secondary | ICD-10-CM | POA: Diagnosis not present

## 2018-12-23 ENCOUNTER — Other Ambulatory Visit: Payer: Self-pay | Admitting: Family Medicine

## 2018-12-23 DIAGNOSIS — K219 Gastro-esophageal reflux disease without esophagitis: Secondary | ICD-10-CM

## 2018-12-30 ENCOUNTER — Other Ambulatory Visit: Payer: Self-pay | Admitting: Family Medicine

## 2018-12-30 DIAGNOSIS — J309 Allergic rhinitis, unspecified: Secondary | ICD-10-CM

## 2018-12-30 DIAGNOSIS — J302 Other seasonal allergic rhinitis: Secondary | ICD-10-CM

## 2019-01-02 DIAGNOSIS — E559 Vitamin D deficiency, unspecified: Secondary | ICD-10-CM | POA: Diagnosis not present

## 2019-01-02 DIAGNOSIS — M797 Fibromyalgia: Secondary | ICD-10-CM | POA: Diagnosis not present

## 2019-01-08 ENCOUNTER — Ambulatory Visit: Payer: Medicare Other | Admitting: Cardiology

## 2019-01-12 ENCOUNTER — Other Ambulatory Visit: Payer: Self-pay | Admitting: Family Medicine

## 2019-01-12 DIAGNOSIS — R079 Chest pain, unspecified: Secondary | ICD-10-CM

## 2019-02-04 DIAGNOSIS — Z1231 Encounter for screening mammogram for malignant neoplasm of breast: Secondary | ICD-10-CM | POA: Diagnosis not present

## 2019-02-04 DIAGNOSIS — Z779 Other contact with and (suspected) exposures hazardous to health: Secondary | ICD-10-CM | POA: Diagnosis not present

## 2019-03-03 DIAGNOSIS — L0292 Furuncle, unspecified: Secondary | ICD-10-CM | POA: Diagnosis not present

## 2019-03-03 DIAGNOSIS — M797 Fibromyalgia: Secondary | ICD-10-CM | POA: Diagnosis not present

## 2019-03-03 DIAGNOSIS — G8929 Other chronic pain: Secondary | ICD-10-CM | POA: Diagnosis not present

## 2019-03-08 ENCOUNTER — Other Ambulatory Visit: Payer: Self-pay | Admitting: Gastroenterology

## 2019-04-07 DIAGNOSIS — L732 Hidradenitis suppurativa: Secondary | ICD-10-CM | POA: Diagnosis not present

## 2019-04-07 DIAGNOSIS — Z760 Encounter for issue of repeat prescription: Secondary | ICD-10-CM | POA: Diagnosis not present

## 2019-04-07 DIAGNOSIS — G8929 Other chronic pain: Secondary | ICD-10-CM | POA: Diagnosis not present

## 2019-05-06 DIAGNOSIS — Z760 Encounter for issue of repeat prescription: Secondary | ICD-10-CM | POA: Diagnosis not present

## 2019-05-06 DIAGNOSIS — M797 Fibromyalgia: Secondary | ICD-10-CM | POA: Diagnosis not present

## 2019-05-06 DIAGNOSIS — H16142 Punctate keratitis, left eye: Secondary | ICD-10-CM | POA: Diagnosis not present

## 2019-06-03 DIAGNOSIS — G8929 Other chronic pain: Secondary | ICD-10-CM | POA: Diagnosis not present

## 2019-06-03 DIAGNOSIS — M797 Fibromyalgia: Secondary | ICD-10-CM | POA: Diagnosis not present

## 2019-07-09 DIAGNOSIS — Z760 Encounter for issue of repeat prescription: Secondary | ICD-10-CM | POA: Diagnosis not present

## 2019-07-09 DIAGNOSIS — N819 Female genital prolapse, unspecified: Secondary | ICD-10-CM | POA: Diagnosis not present

## 2019-07-09 DIAGNOSIS — G8929 Other chronic pain: Secondary | ICD-10-CM | POA: Diagnosis not present

## 2019-07-09 DIAGNOSIS — M797 Fibromyalgia: Secondary | ICD-10-CM | POA: Diagnosis not present

## 2019-07-15 ENCOUNTER — Other Ambulatory Visit: Payer: Self-pay | Admitting: Gastroenterology

## 2019-08-13 ENCOUNTER — Encounter: Payer: Self-pay | Admitting: Nurse Practitioner

## 2019-08-13 ENCOUNTER — Ambulatory Visit (INDEPENDENT_AMBULATORY_CARE_PROVIDER_SITE_OTHER): Payer: Medicare Other | Admitting: Nurse Practitioner

## 2019-08-13 VITALS — BP 118/72 | HR 71 | Temp 98.1°F | Ht 66.0 in | Wt 174.0 lb

## 2019-08-13 DIAGNOSIS — K219 Gastro-esophageal reflux disease without esophagitis: Secondary | ICD-10-CM | POA: Diagnosis not present

## 2019-08-13 DIAGNOSIS — K5909 Other constipation: Secondary | ICD-10-CM

## 2019-08-13 MED ORDER — NA SULFATE-K SULFATE-MG SULF 17.5-3.13-1.6 GM/177ML PO SOLN: ORAL | 0 refills | Status: DC

## 2019-08-13 MED ORDER — LUBIPROSTONE 24 MCG PO CAPS: 24.0000 ug | ORAL_CAPSULE | Freq: Two times a day (BID) | ORAL | 3 refills | Status: DC

## 2019-08-13 NOTE — Progress Notes (Signed)
IMPRESSION and PLAN:    59 year old female with PMH significant for GERD, chronic constipation, gastritis, colon polyps in 2014, Mia, hypertension, depression, fibromyalgia   # Chronic constipation.  --Patient on multiple medications which have potential to cause constipation.  She has been on Linzess for couple of years, initially worked much better.  She is also taking MiraLAX 3 times daily each time with 16 ounces of water.  No bowel movement in days --Purge bowels with a bowel prep --Stop Linzess.  We will try Amitiza 24 mcg twice daily.  After leaving the clinic patient called her insurance company and they will cover it --If Amitiza works well then may not need to take MiraLAX 3 times daily  #GERD --Asymptomatic on daily PPI  HPI:    Primary GI: Dr. Ardis Hughs      Chief complaint : Constipation  Shakisha is here with ongoing constipation.  She is taking Linzess every day, sometimes 2 a day.  She is taking MiraLAX 1 capful 3 times daily.  She is drinking close to 64 ounces of water a day.  She has not had a bowel movement in several days.  Linaclotide worked much better for her in the beginning, seems to lost efficacy.  She is on multiple medications, many of them with potential to constipate.  Last colonoscopy was February 2019, she was put on a 10-year recall list  Review of systems:     No chest pain, no SOB, no fevers, no urinary sx   Past Medical History:  Diagnosis Date  . Anxiety   . Chest pain, unspecified   . Cough   . Depression   . Fibromyalgia   . Headache(784.0)   . Other and unspecified hyperlipidemia    mixed  . Personal history of unspecified circulatory disease   . Unspecified essential hypertension     Patient's surgical history, family medical history, social history, medications and allergies were all reviewed in Epic   Creatinine clearance cannot be calculated (Patient's most recent lab result is older than the maximum 21 days  allowed.)  Current Outpatient Medications  Medication Sig Dispense Refill  . ALPRAZolam (XANAX) 1 MG tablet TAKE 1 TABLET EVERY 8 HOURS AS NEEDED FOR ANXIETY  3  . alprazolam (XANAX) 2 MG tablet Take 2 mg by mouth every 4 (four) hours as needed for sleep.    Marland Kitchen amLODipine (NORVASC) 2.5 MG tablet TAKE 1 TABLET (2.5 MG TOTAL) BY MOUTH DAILY. 90 tablet 3  . azelastine (ASTELIN) 0.1 % nasal spray PLACE 2 SPRAYS INTO BOTH NOSTRILS 2 (TWO) TIMES DAILY. USE IN EACH NOSTRIL AS DIRECTED 18 mL 11  . budesonide (RHINOCORT AQUA) 32 MCG/ACT nasal spray Place 2 sprays into both nostrils daily. 3 Bottle 3  . cetirizine (ZYRTEC) 10 MG tablet Take 1 tablet (10 mg total) by mouth daily. 30 tablet 11  . diazepam (VALIUM) 5 MG tablet Take 5 mg by mouth every 6 (six) hours as needed for anxiety (1-2 tabs at a time).    Marland Kitchen diclofenac sodium (VOLTAREN) 1 % GEL 3 grams to 3 large joints up to 3 times daily 3 Tube 3  . EPINEPHrine 0.3 mg/0.3 mL IJ SOAJ injection Inject 0.3 mLs (0.3 mg total) into the muscle once. 1 Device 0  . erythromycin ophthalmic ointment 1 application as needed.     . fentaNYL (DURAGESIC - DOSED MCG/HR) 50 MCG/HR Place 50 mcg onto the skin as needed.     Marland Kitchen  fluconazole (DIFLUCAN) 150 MG tablet Take 150 mg by mouth as needed.     . gabapentin (NEURONTIN) 300 MG capsule TAKE 1 CAPSULE(S) IN THE MORNING , THEN 2 CAPSULES AT NIGHT  3  . HYDROcodone-acetaminophen (NORCO) 10-325 MG tablet Take 1 tablet by mouth every 6 (six) hours as needed.    Marland Kitchen HYDROmorphone (DILAUDID) 2 MG tablet Take 1 tablet (2 mg total) by mouth every 4 (four) hours as needed for pain. 10 tablet 0  . levocetirizine (XYZAL) 5 MG tablet TAKE 1 TABLET (5 MG TOTAL) BY MOUTH EVERY EVENING. 90 tablet 3  . lidocaine (XYLOCAINE) 2 % solution Use a small amount in the mouth prior to eating. 100 mL 0  . lidocaine (XYLOCAINE) 5 % ointment Apply 1 application topically as needed.    Marland Kitchen LINZESS 290 MCG CAPS capsule TAKE 1 CAPSULE BY MOUTH DAILY.  30 capsule 3  . metaxalone (SKELAXIN) 800 MG tablet Take 800 mg by mouth 2 (two) times daily as needed (muscle spasms).     . methocarbamol (ROBAXIN) 500 MG tablet Take 500 mg by mouth every 8 (eight) hours as needed for muscle spasms.    . mirabegron ER (MYRBETRIQ) 25 MG TB24 tablet Take 25 mg by mouth as needed.     . montelukast (SINGULAIR) 10 MG tablet TAKE 1 TABLET BY MOUTH EVERYDAY AT BEDTIME 90 tablet 0  . nitroGLYCERIN (NITROSTAT) 0.4 MG SL tablet DISSOLVE 1 TABLET UNDER TONGUE AS NEEDED FOR CHEST PAIN 75 tablet 0  . olopatadine (PATANOL) 0.1 % ophthalmic solution Place 1 drop into both eyes 2 (two) times daily as needed for allergies.     . Oxycodone HCl 10 MG TABS Take 10 mg by mouth as needed.    . pantoprazole (PROTONIX) 40 MG tablet TAKE 1 TABLET BY MOUTH EVERY DAY AT 6PM 90 tablet 3  . polyethylene glycol (MIRALAX / GLYCOLAX) packet Take 17 g by mouth 2 (two) times daily. (Patient taking differently: Take 17 g by mouth daily. ) 14 each 0  . predniSONE (DELTASONE) 10 MG tablet TAKE 1 TAB THREE TIMES A DAY FOR 2 DAYS, 1 TAB TWICE A DAY FOR 5 DAYS, 1 TAB DAILY TILL FINISHED  0  . promethazine (PHENERGAN) 25 MG tablet Take 25 mg by mouth every 6 (six) hours as needed for nausea or vomiting.    . rosuvastatin (CRESTOR) 5 MG tablet TAKE 1 TABLET BY MOUTH AT BEDTIME 90 tablet 3  . tamsulosin (FLOMAX) 0.4 MG CAPS capsule Take 0.4 mg by mouth daily.  3  . tiZANidine (ZANAFLEX) 2 MG tablet TAKE (1) TABLET BY MOUTH EVERY 6 TO 8 HOURS AS NEEDED**MAXIMUM OF 3 DOSES IN 24 HOURS**  0  . triamterene-hydrochlorothiazide (MAXZIDE) 75-50 MG tablet TAKE 1 TABLET BY MOUTH EVERY DAY AS NEEDED FOR FLUID RETENTION  12  . valACYclovir (VALTREX) 500 MG tablet Take 500 mg by mouth daily as needed (infection).     Marland Kitchen zolpidem (AMBIEN) 10 MG tablet Take 1 tablet (10 mg total) by mouth every other day. Alternates with temazepam 10 tablet 0  . omeprazole (PRILOSEC) 20 MG capsule Take 1 capsule (20 mg total) by mouth  2 (two) times daily before a meal. 180 capsule 3   No current facility-administered medications for this visit.    Physical Exam:     BP 118/72   Pulse 71   Temp 98.1 F (36.7 C)   Ht 5\' 6"  (1.676 m)   Wt 174 lb (78.9 kg)  BMI 28.08 kg/m   GENERAL:  Pleasant female in NAD PSYCH: : Cooperative, normal affect CARDIAC:  RRR, murmur heard, no peripheral edema PULM: Normal respiratory effort, lungs CTA bilaterally, no wheezing ABDOMEN:  Nondistended, soft, nontender. No obvious masses, no hepatomegaly,  normal bowel sounds SKIN:  turgor, no lesions seen Musculoskeletal:  Normal muscle tone, normal strength NEURO: Alert and oriented x 3, no focal neurologic deficits   Tye Savoy , NP 08/13/2019, 4:05 PM

## 2019-08-13 NOTE — Patient Instructions (Addendum)
If you are age 60 or older, your body mass index should be between 23-30. Your Body mass index is 28.08 kg/m. If this is out of the aforementioned range listed, please consider follow up with your Primary Care Provider.  If you are age 19 or younger, your body mass index should be between 19-25. Your Body mass index is 28.08 kg/m. If this is out of the aformentioned range listed, please consider follow up with your Primary Care Provider.   Please check with your insurance to see if MOTEGRITY or AMITIZA is covered.  Stop Linzess, start Amitiza after bowel purge.  Drink the bowel prep to purge the bowels. Follow directions on box.  Thank you for choosing me and [facility].  Tye Savoy, NP

## 2019-08-14 NOTE — Progress Notes (Signed)
I agree with the above note, plan 

## 2019-08-25 ENCOUNTER — Telehealth: Payer: Self-pay | Admitting: Nurse Practitioner

## 2019-08-25 DIAGNOSIS — E559 Vitamin D deficiency, unspecified: Secondary | ICD-10-CM | POA: Diagnosis not present

## 2019-08-25 DIAGNOSIS — R87619 Unspecified abnormal cytological findings in specimens from cervix uteri: Secondary | ICD-10-CM | POA: Diagnosis not present

## 2019-08-25 NOTE — Telephone Encounter (Signed)
Patient with chronic constipation. Saw Paula on 08/13/19. She was started on Amitiza. Previously on Linzess which worked well for years before losing efficacy. She took the [place] on Friday. She reports it causes "terrible stomach cramps" and "made me feel sick to my stomach." The 2nd dose of the day she became very nauseated, sweaty and hot when she was having her bowel movement. She stood up and lost consciousness , fell, hit her head and scathed her face on her night stand. She has not taken the Amitiza since Friday when this all happened. She has not had a bowel movement since. She does not want to take the Amitiza again. Can she try Motegrity samples?

## 2019-08-25 NOTE — Telephone Encounter (Signed)
Routing to Paula

## 2019-08-26 MED ORDER — MOTEGRITY 2 MG PO TABS: 1.0000 | ORAL_TABLET | Freq: Every day | ORAL | 0 refills | Status: DC

## 2019-08-26 NOTE — Telephone Encounter (Signed)
Yes, samples of motegrity are ok to offer her.  One pill once daily, she should call to let us know if it works well in 1-2 weeks and we can call in a prescription then.

## 2019-08-26 NOTE — Telephone Encounter (Signed)
Patient advised. She agrees to this plan. She will come to pick up her samples from the front desk.

## 2019-09-01 ENCOUNTER — Telehealth: Payer: Self-pay | Admitting: Nurse Practitioner

## 2019-09-01 NOTE — Telephone Encounter (Signed)
Dr Ardis Hughs please review and advise.  Patient with chronic constipation. She has taken [place] for 7 days. She reports she is not passing stool and only a little gas. She is bloated and uncomfortable. She is also on Miralax 3 times a day. She is asking to go back to a high dose of Linzess.

## 2019-09-01 NOTE — Telephone Encounter (Signed)
Okay, I agree with her going back on high-dose Linzess.  Recommend MiraLAX Gatorade purge over the next 1 to 2 days.  She needs my first available office visit, I have not seen her in person in quite some time.

## 2019-09-01 NOTE — Telephone Encounter (Signed)
Pt states that Motegrity is not working for her, she has been taking it for 7 days and has not had a bm since, she also reported that it makes her sick to her stomach. She would like to go back to Linzess on a high dose.

## 2019-09-01 NOTE — Telephone Encounter (Signed)
Spoke with the patient. Directions for the purge given. Confirmed through teach back method.  She would like to take Linzess more than 290 mcg daily. She feels this would work for her.  Agrees to discuss her concerns with Dr Ardis Hughs. Appointment scheduled. She will need a new Rx for Linzess if this is prescribed.

## 2019-09-03 ENCOUNTER — Encounter: Payer: Self-pay | Admitting: Gastroenterology

## 2019-09-03 ENCOUNTER — Ambulatory Visit (INDEPENDENT_AMBULATORY_CARE_PROVIDER_SITE_OTHER): Payer: Medicare Other | Admitting: Gastroenterology

## 2019-09-03 VITALS — BP 112/70 | HR 63 | Temp 97.3°F | Ht 66.0 in | Wt 168.0 lb

## 2019-09-03 DIAGNOSIS — K5909 Other constipation: Secondary | ICD-10-CM | POA: Diagnosis not present

## 2019-09-03 MED ORDER — LINACLOTIDE 290 MCG PO CAPS: 290.0000 ug | ORAL_CAPSULE | Freq: Every day | ORAL | 11 refills | Status: DC

## 2019-09-03 MED ORDER — LINACLOTIDE 145 MCG PO CAPS: 145.0000 ug | ORAL_CAPSULE | Freq: Every day | ORAL | 11 refills | Status: DC

## 2019-09-03 NOTE — Patient Instructions (Addendum)
If you are age 60 or older, your body mass index should be between 23-30. Your Body mass index is 27.12 kg/m. If this is out of the aforementioned range listed, please consider follow up with your Primary Care Provider.  If you are age 17 or younger, your body mass index should be between 19-25. Your Body mass index is 27.12 kg/m. If this is out of the aformentioned range listed, please consider follow up with your Primary Care Provider.   RESTART: Linzess 262mcg 1 capsule daily RESTART: Linzess 145 mcg 1 capsule daily  Call our office in 4 weeks to let us know how you are feeling.  Thank you for entrusting me with your care and choosing [facility].  Dr Ardis Hughs

## 2019-09-03 NOTE — Progress Notes (Signed)
Review of pertinent gastrointestinal problems:  1. Post prandial abd pain, weight loss (hospitalized) Ardis Hughs EGD: 9/2014There was a moderate amount of retained solid and liquid gastric contents without anatomic obstruction. There was mild to moderate distal gastritis which was biopsied to check for H. pylori. The examination was otherwise normal. Path showed no h. Pylori. GES 02/2013 was normal.  EGD February 2019 for dyspepsia Dr. Ardis Hughs found mild nonspecific gastritis.  Biopsies positive for H. pylori, she completed 2-week course of Pylera 2. EGD, colonoscopy Dr. Collene Mares. Colonoscopy Dr. Collene Mares, 02/2007; for guiac Pos stools; "limited by poor prep...large amount of stool in the colon." Findings normal examination to the terminal ileum. Recommended repeat colonoscopy in 10 years. EGD Dr. Collene Mares, 02/2007; for dysphagia, findings, normal examination. I recommend colonoscopy sooner (2014) given the "poor prep" described by Dr. Collene Mares.  3. Constipation: improved with linzess, multiple chronic narcotics likely contribute to her constipation 4. Adenomatous polyp: Colonoscopy Dr. Ardis Hughs 04/2013 (one sub CM adenoma removed, recommended recall at 5 years.).  Colonoscopy February 2019 Dr. Ardis Hughs for hematochezia history of polyps showed left-sided diverticulosis and medium sized internal hemorrhoids.  She was recommended to have repeat colonoscopy at 10-year interval   HPI: This is a pleasant 59 year old woman who was last seen here in the office 3 to 4 weeks ago.  She has significant chronic constipation.  Daily chronic narcotic medicines play a role however she has not taken any in at least 2 or 3 months.  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  I am glad that she has not been on any recently however I think she is still left with significant chronic constipation because of it.  Amitiza caused her to have syncope.  Motegrity caused serious severe nausea.  Linzess has  always been pretty helpful except she thinks she needs higher dose than the usual to 90 mcg once daily.  She thinks that plus a 145 once daily may benefit her and work.   Chief complaint is chronic constipation  ROS: complete GI ROS as described in HPI, all other review negative.  Constitutional:  No unintentional weight loss   Past Medical History:  Diagnosis Date  . Anxiety   . Chest pain, unspecified   . Cough   . Depression   . Fibromyalgia   . Headache(784.0)   . Other and unspecified hyperlipidemia    mixed  . Personal history of unspecified circulatory disease   . Unspecified essential hypertension     Past Surgical History:  Procedure Laterality Date  . anterior perineoplasty     posterior repair. 05/16/02  . BLADDER SUSPENSION    . CARDIAC CATHETERIZATION  2006   normal coronaries  . ESOPHAGOGASTRODUODENOSCOPY N/A 02/09/2013   Procedure: ESOPHAGOGASTRODUODENOSCOPY (EGD);  Surgeon: Milus Banister, MD;  Location: [facility];  Service: Endoscopy;  Laterality: N/A;  . excision sebaceous cyst  11/2013   posterior scalp  . HEMATOMA EVACUATION     vaginal hematoma 05/23/02  . shoulder surgery     sebacceous cyst removal  . UTERINE SUSPENSION      Current Outpatient Medications  Medication Sig Dispense Refill  . ALPRAZolam (XANAX) 1 MG tablet TAKE 1 TABLET EVERY 8 HOURS AS NEEDED FOR ANXIETY  3  . alprazolam (XANAX) 2 MG tablet Take 2 mg by mouth every 4 (four) hours as needed for sleep.    Marland Kitchen amLODipine (NORVASC) 2.5 MG tablet TAKE 1 TABLET (2.5 MG TOTAL) BY MOUTH DAILY.  90 tablet 3  . azelastine (ASTELIN) 0.1 % nasal spray PLACE 2 SPRAYS INTO BOTH NOSTRILS 2 (TWO) TIMES DAILY. USE IN EACH NOSTRIL AS DIRECTED 18 mL 11  . bethanechol (URECHOLINE) 10 MG tablet Take 10 mg by mouth 3 (three) times daily as needed (bladder spasms).    . budesonide (RHINOCORT AQUA) 32 MCG/ACT nasal spray Place 2 sprays into both nostrils daily. 3 Bottle 3  . cetirizine (ZYRTEC) 10 MG  tablet Take 1 tablet (10 mg total) by mouth daily. 30 tablet 11  . diazepam (VALIUM) 5 MG tablet Take 5 mg by mouth every 6 (six) hours as needed for anxiety (1-2 tabs at a time).    Marland Kitchen diclofenac sodium (VOLTAREN) 1 % GEL 3 grams to 3 large joints up to 3 times daily 3 Tube 3  . EPINEPHrine 0.3 mg/0.3 mL IJ SOAJ injection Inject 0.3 mLs (0.3 mg total) into the muscle once. 1 Device 0  . erythromycin ophthalmic ointment 1 application as needed.     Marland Kitchen escitalopram (LEXAPRO) 20 MG tablet Take 20 mg by mouth daily.    . fentaNYL (DURAGESIC - DOSED MCG/HR) 50 MCG/HR Place 50 mcg onto the skin as needed.     . fluconazole (DIFLUCAN) 150 MG tablet Take 150 mg by mouth as needed.     . gabapentin (NEURONTIN) 300 MG capsule TAKE 1 CAPSULE(S) IN THE MORNING , THEN 2 CAPSULES AT NIGHT  3  . HYDROcodone-acetaminophen (NORCO) 10-325 MG tablet Take 1 tablet by mouth every 6 (six) hours as needed.    Marland Kitchen HYDROmorphone (DILAUDID) 2 MG tablet Take 1 tablet (2 mg total) by mouth every 4 (four) hours as needed for pain. 10 tablet 0  . levocetirizine (XYZAL) 5 MG tablet TAKE 1 TABLET (5 MG TOTAL) BY MOUTH EVERY EVENING. 90 tablet 3  . lidocaine (XYLOCAINE) 2 % solution Use a small amount in the mouth prior to eating. 100 mL 0  . lidocaine (XYLOCAINE) 5 % ointment Apply 1 application topically as needed.    . metaxalone (SKELAXIN) 800 MG tablet Take 800 mg by mouth 2 (two) times daily as needed (muscle spasms).     . methocarbamol (ROBAXIN) 500 MG tablet Take 500 mg by mouth every 8 (eight) hours as needed for muscle spasms.    . mirabegron ER (MYRBETRIQ) 25 MG TB24 tablet Take 25 mg by mouth as needed.     . montelukast (SINGULAIR) 10 MG tablet TAKE 1 TABLET BY MOUTH EVERYDAY AT BEDTIME 90 tablet 0  . morphine (MS CONTIN) 15 MG 12 hr tablet Take 1 tablet (15 mg total) by mouth 2 (two) times daily as needed for pain. 10 tablet 0  . Na Sulfate-K Sulfate-Mg Sulf 17.5-3.13-1.6 GM/177ML SOLN Suprep-Use as directed 354 mL 0   . nitroGLYCERIN (NITROSTAT) 0.4 MG SL tablet DISSOLVE 1 TABLET UNDER TONGUE AS NEEDED FOR CHEST PAIN 75 tablet 0  . olopatadine (PATANOL) 0.1 % ophthalmic solution Place 1 drop into both eyes 2 (two) times daily as needed for allergies.     . Oxycodone HCl 10 MG TABS Take 10 mg by mouth as needed.    . pantoprazole (PROTONIX) 40 MG tablet TAKE 1 TABLET BY MOUTH EVERY DAY AT 6PM 90 tablet 3  . polyethylene glycol (MIRALAX / GLYCOLAX) packet Take 17 g by mouth 2 (two) times daily. (Patient taking differently: Take 17 g by mouth daily. ) 14 each 0  . predniSONE (DELTASONE) 10 MG tablet TAKE 1 TAB THREE TIMES A  DAY FOR 2 DAYS, 1 TAB TWICE A DAY FOR 5 DAYS, 1 TAB DAILY TILL FINISHED  0  . promethazine (PHENERGAN) 25 MG tablet Take 25 mg by mouth every 6 (six) hours as needed for nausea or vomiting.    . Prucalopride Succinate (MOTEGRITY) 2 MG TABS Take 1 tablet (2 mg total) by mouth daily. 21 tablet 0  . rosuvastatin (CRESTOR) 5 MG tablet TAKE 1 TABLET BY MOUTH AT BEDTIME 90 tablet 3  . tamsulosin (FLOMAX) 0.4 MG CAPS capsule Take 0.4 mg by mouth daily.  3  . tiZANidine (ZANAFLEX) 2 MG tablet TAKE (1) TABLET BY MOUTH EVERY 6 TO 8 HOURS AS NEEDED**MAXIMUM OF 3 DOSES IN 24 HOURS**  0  . triamterene-hydrochlorothiazide (MAXZIDE) 75-50 MG tablet TAKE 1 TABLET BY MOUTH EVERY DAY AS NEEDED FOR FLUID RETENTION  12  . valACYclovir (VALTREX) 500 MG tablet Take 500 mg by mouth daily as needed (infection).     Marland Kitchen zolpidem (AMBIEN) 10 MG tablet Take 1 tablet (10 mg total) by mouth every other day. Alternates with temazepam 10 tablet 0  . omeprazole (PRILOSEC) 20 MG capsule Take 1 capsule (20 mg total) by mouth 2 (two) times daily before a meal. 180 capsule 3   No current facility-administered medications for this visit.    Allergies as of 09/03/2019 - Review Complete 09/03/2019  Allergen Reaction Noted  . Savella [milnacipran hcl] Anaphylaxis 10/14/2011  . Amitiza [lubiprostone]  09/03/2019  . Bee venom  Swelling   . Codeine Hives and Itching 11/05/2008  . Ivp dye [iodinated diagnostic agents] Hives and Itching 10/10/2011    Family History  Problem Relation Age of Onset  . Allergies Mother        also children  . Heart disease Father   . Asthma Daughter   . Breast cancer Maternal Grandmother   . Colon cancer Brother   . Asthma Grandchild     Social History   Socioeconomic History  . Marital status: Single    Spouse name: Not on file  . Number of children: 3  . Years of education: Not on file  . Highest education level: Not on file  Occupational History  . Occupation: disabled  Tobacco Use  . Smoking status: Never Smoker  . Smokeless tobacco: Never Used  Substance and Sexual Activity  . Alcohol use: No  . Drug use: No  . Sexual activity: Not Currently  Other Topics Concern  . Not on file  Social History Narrative   Single, children, works as a Recruitment consultant.  On disability (fibromyalgia, heart, depression/anxiety)   Social Determinants of Health   Financial Resource Strain:   . Difficulty of Paying Living Expenses:   Food Insecurity:   . Worried About Charity fundraiser in the Last Year:   . Arboriculturist in the Last Year:   Transportation Needs:   . Film/video editor (Medical):   Marland Kitchen Lack of Transportation (Non-Medical):   Physical Activity:   . Days of Exercise per Week:   . Minutes of Exercise per Session:   Stress:   . Feeling of Stress :   Social Connections:   . Frequency of Communication with Friends and Family:   . Frequency of Social Gatherings with Friends and Family:   . Attends Religious Services:   . Active Member of Clubs or Organizations:   . Attends Archivist Meetings:   Marland Kitchen Marital Status:   Intimate Partner Violence:   . Fear of  Current or Ex-Partner:   . Emotionally Abused:   Marland Kitchen Physically Abused:   . Sexually Abused:      Physical Exam: BP 112/70   Pulse 63   Temp (!) 97.3 F (36.3 C)   Ht 5\' 6"  (1.676 m)   Wt 168  lb (76.2 kg)   HC 97.3" (247.1 cm)   BMI 27.12 kg/m  Constitutional: generally well-appearing Psychiatric: alert and oriented x3 Abdomen: soft, nontender, nondistended, no obvious ascites, no peritoneal signs, normal bowel sounds No peripheral edema noted in lower extremities  Assessment and plan: 59 y.o. 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    Fortunately she has not required narcotic medicines in 2 or 3 months but I think she is still has a damaged colon from chronic usage in the past and I suspect she will always have significant constipation because of it.  Linzess has always helped but she began to be a bit tolerant of 290 mcg once daily.  I am going to call her in refills on to 90s and she will also take a 145 mcg Linzess every day as well.  She will call to report on her response in 4 or 5 weeks.  Please see the "Patient Instructions" section for addition details about the plan.  Owens Loffler, MD [facility] 09/03/2019, 1:33 PM   Total time on date of encounter was 30 minutes (this included time spent preparing to see the patient reviewing records; obtaining and/or reviewing separately obtained history; performing a medically appropriate exam and/or evaluation; counseling and educating the patient and family if present; ordering medications, tests or procedures if applicable; and documenting clinical information in the health record).

## 2019-09-11 ENCOUNTER — Other Ambulatory Visit: Payer: Self-pay | Admitting: Family Medicine

## 2019-09-11 DIAGNOSIS — E785 Hyperlipidemia, unspecified: Secondary | ICD-10-CM

## 2019-09-11 DIAGNOSIS — J302 Other seasonal allergic rhinitis: Secondary | ICD-10-CM

## 2019-09-18 IMAGING — CT CT ABD-PELV W/O CM
2 of 4 series · 16 of 46 positions shown, 18 images · non-contrast
Comparison: 02/07/2013

CLINICAL DATA: Abdominal swelling after eating for several months

EXAM:
CT ABDOMEN AND PELVIS WITHOUT CONTRAST
TECHNIQUE: Multidetector CT imaging of the abdomen and pelvis was performed
following the standard protocol without IV contrast.

[Series 2: abd/ pelvis · axial · 0.80mm/px · z∈[-593,-198]mm · 13 of 87 slices shown, 15 images]
[im 4/87  soft-tissue]
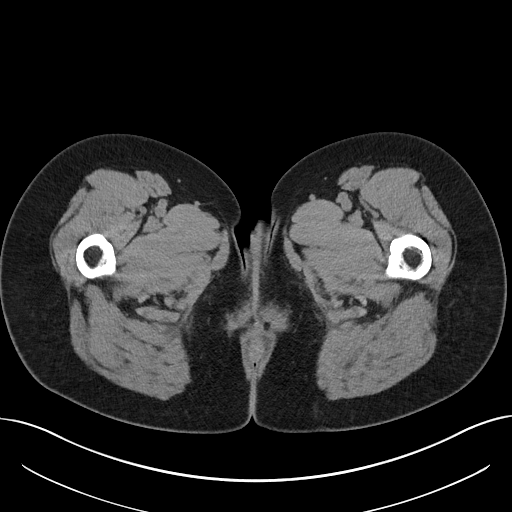
[im 4/87  bone]
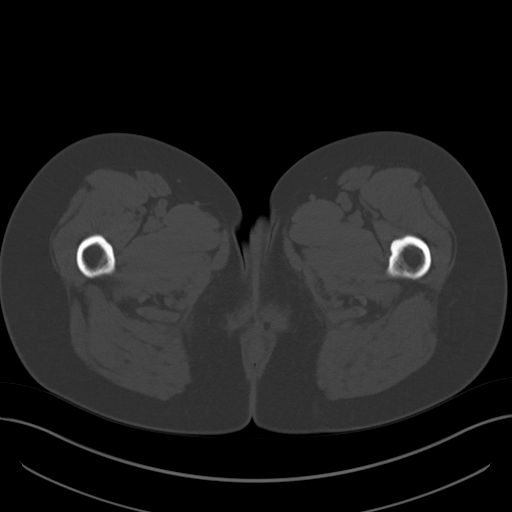
[im 11/87  soft-tissue]
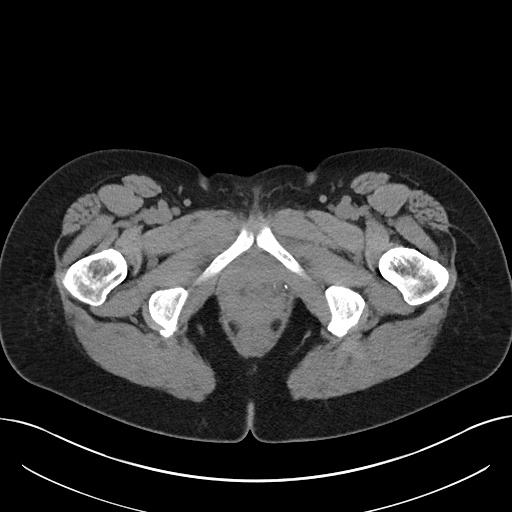
[im 18/87  soft-tissue]
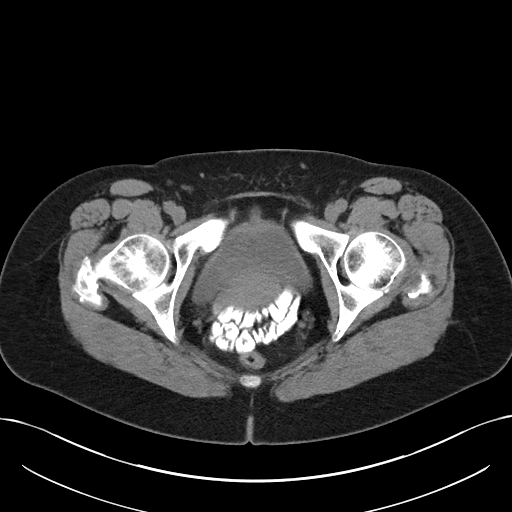
[im 26/87  soft-tissue]
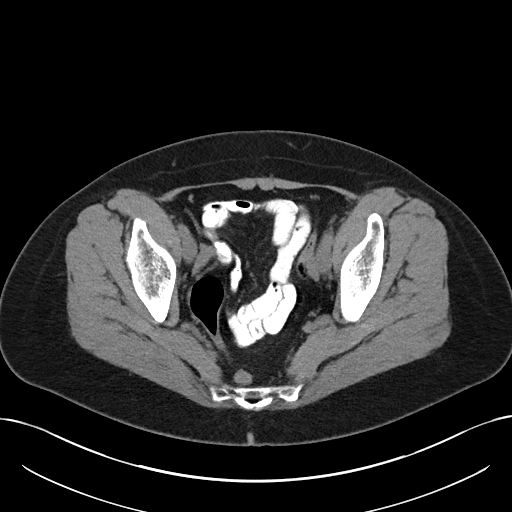
[im 29/87  soft-tissue]
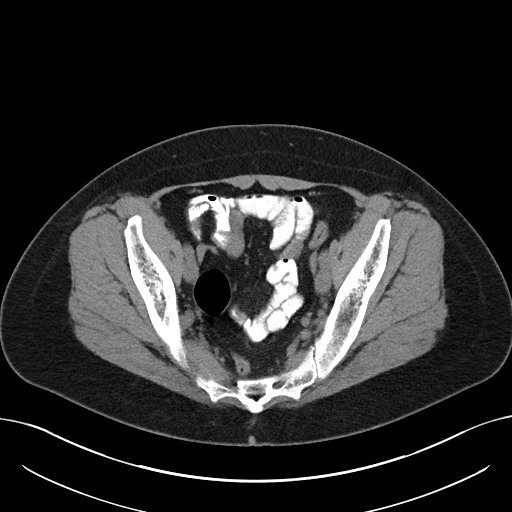
[im 36/87  soft-tissue]
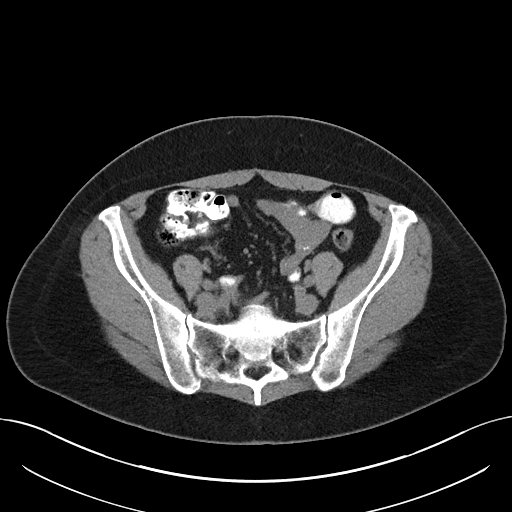
[im 44/87  soft-tissue]
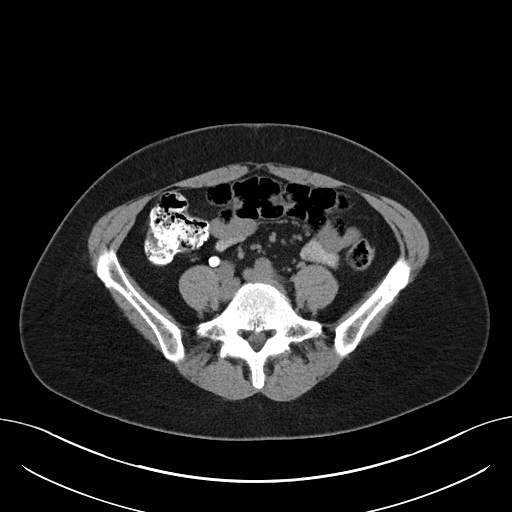
[im 51/87  soft-tissue]
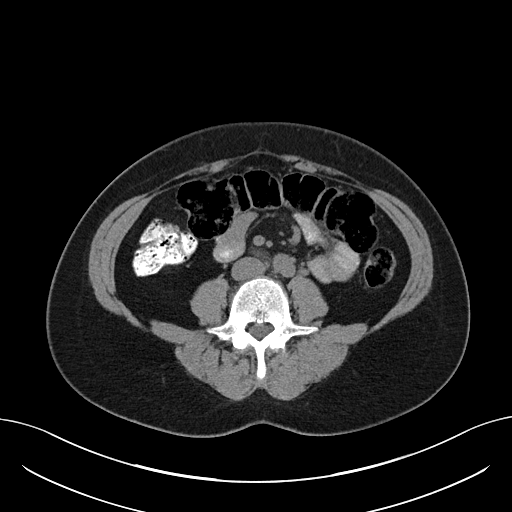
[im 58/87  soft-tissue]
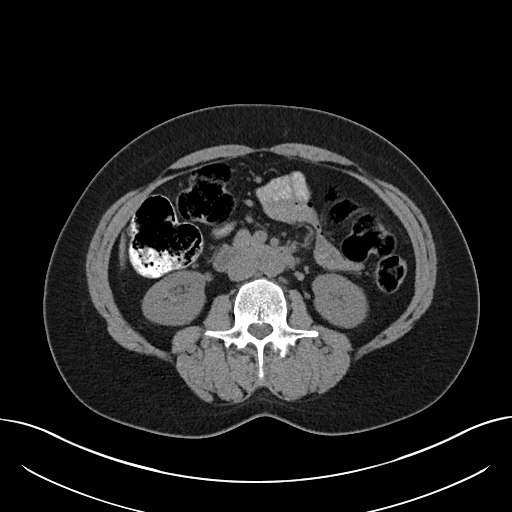
[im 58/87  bone]
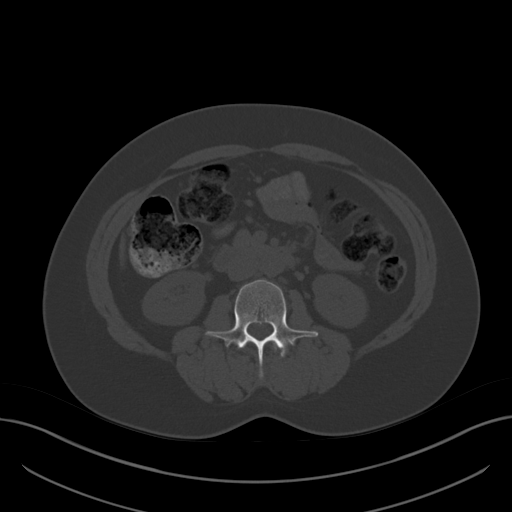
[im 61/87  soft-tissue]
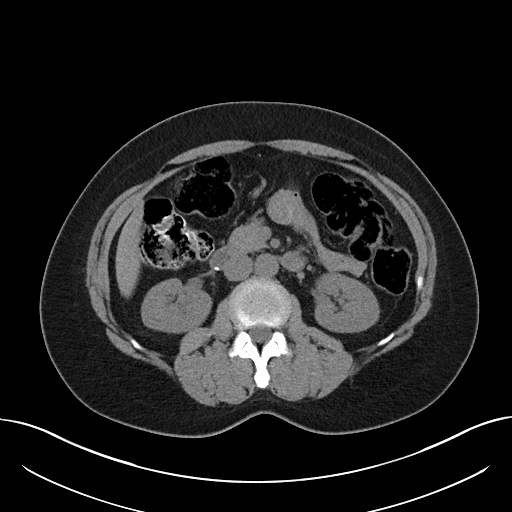
[im 69/87  soft-tissue]
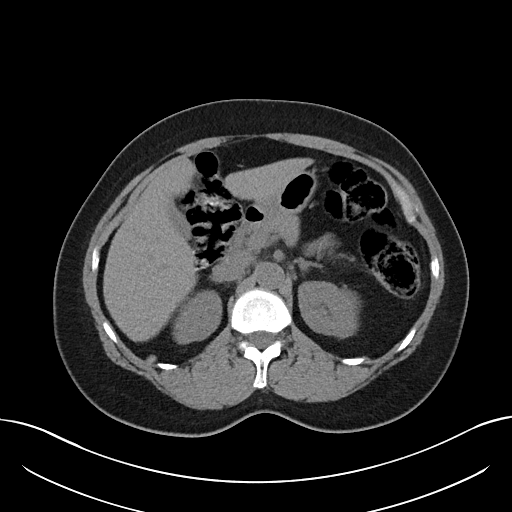
[im 76/87  soft-tissue]
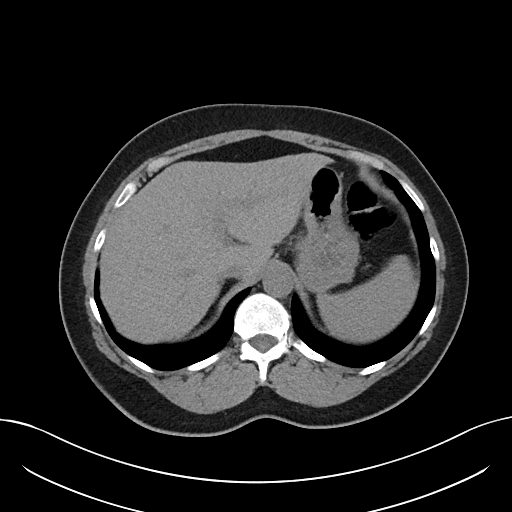
[im 83/87  soft-tissue]
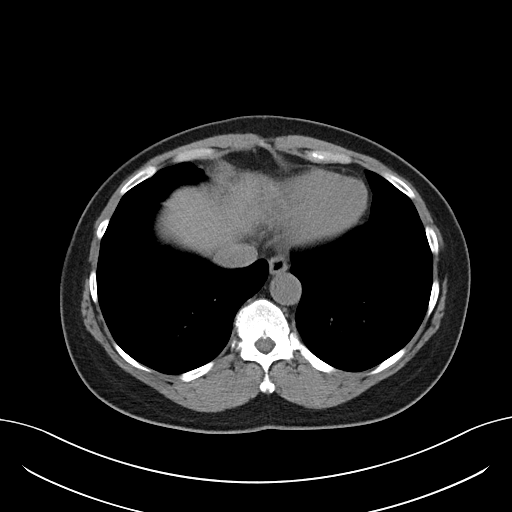

[Series 5: cor st · coronal · 0.85mm/px · 3 of 101 slices shown]
[im 34/101  soft-tissue]
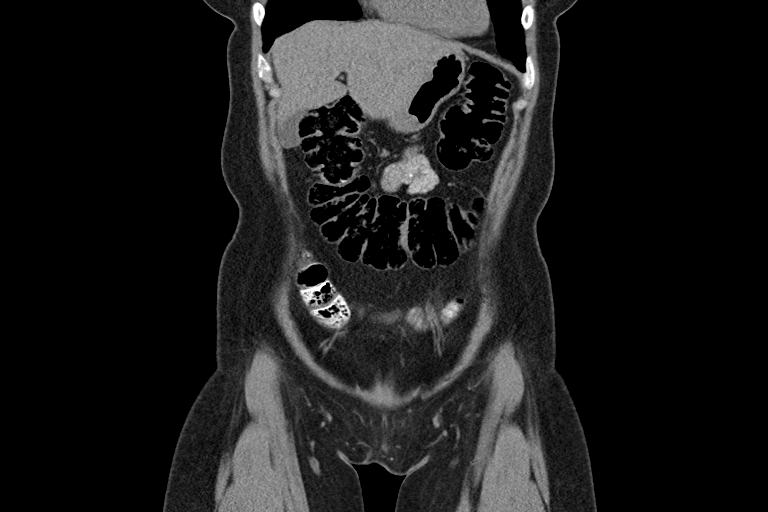
[im 45/101  soft-tissue]
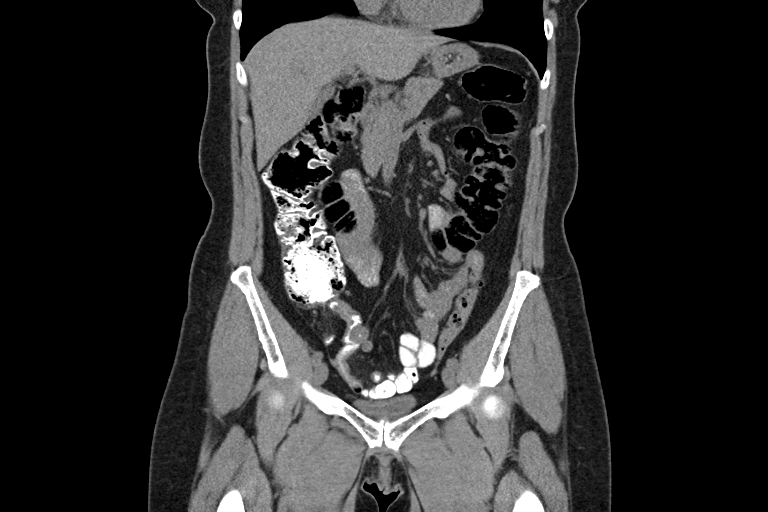
[im 56/101  soft-tissue]
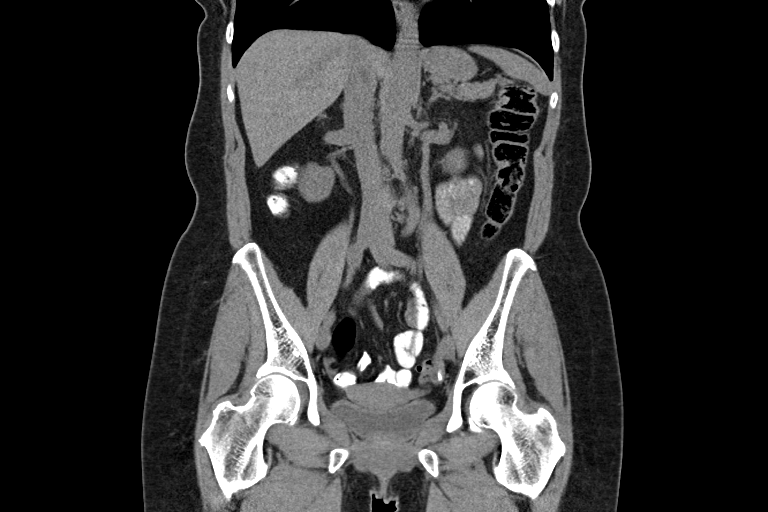

[16 of 46 positions shown; findings below may reference images not displayed]

FINDINGS: Lower chest: No acute abnormality.

Hepatobiliary: No focal liver abnormality is seen. No gallstones,
gallbladder wall thickening, or biliary dilatation.

Pancreas: Unremarkable. No pancreatic ductal dilatation or
surrounding inflammatory changes.

Spleen: Normal in size without focal abnormality.

Adrenals/Urinary Tract: The adrenal glands appear normal.
Unremarkable appearance of the kidneys. No kidney stone, mass or
hydronephrosis identified. Urinary bladder appears normal.

Stomach/Bowel: The stomach is normal. The small bowel loops have a
normal course and caliber. The appendix is visualized and appears
normal. Unremarkable appearance of the colon.

Vascular/Lymphatic: No significant vascular findings are present. No
enlarged abdominal or pelvic lymph nodes.

Reproductive: Uterus and bilateral adnexa are unremarkable.

Other: No abdominal wall hernia or abnormality. No abdominopelvic
ascites.

Musculoskeletal: No acute or significant osseous findings.
IMPRESSION: 1. No acute findings within the abdomen or pelvis. No mass or
adenopathy identified.

## 2019-09-24 ENCOUNTER — Ambulatory Visit (INDEPENDENT_AMBULATORY_CARE_PROVIDER_SITE_OTHER): Payer: Medicare Other | Admitting: Family Medicine

## 2019-09-24 ENCOUNTER — Encounter: Payer: Self-pay | Admitting: Family Medicine

## 2019-09-24 ENCOUNTER — Other Ambulatory Visit: Payer: Self-pay

## 2019-09-24 VITALS — BP 124/80 | HR 60 | Temp 97.8°F | Ht 65.75 in | Wt 164.0 lb

## 2019-09-24 DIAGNOSIS — J309 Allergic rhinitis, unspecified: Secondary | ICD-10-CM | POA: Diagnosis not present

## 2019-09-24 DIAGNOSIS — J302 Other seasonal allergic rhinitis: Secondary | ICD-10-CM

## 2019-09-24 DIAGNOSIS — K5900 Constipation, unspecified: Secondary | ICD-10-CM

## 2019-09-24 DIAGNOSIS — Z560 Unemployment, unspecified: Secondary | ICD-10-CM

## 2019-09-24 DIAGNOSIS — M797 Fibromyalgia: Secondary | ICD-10-CM

## 2019-09-24 DIAGNOSIS — E785 Hyperlipidemia, unspecified: Secondary | ICD-10-CM

## 2019-09-24 DIAGNOSIS — Z0000 Encounter for general adult medical examination without abnormal findings: Secondary | ICD-10-CM

## 2019-09-24 DIAGNOSIS — I10 Essential (primary) hypertension: Secondary | ICD-10-CM | POA: Diagnosis not present

## 2019-09-24 DIAGNOSIS — F331 Major depressive disorder, recurrent, moderate: Secondary | ICD-10-CM

## 2019-09-24 MED ORDER — ROSUVASTATIN CALCIUM 5 MG PO TABS: ORAL_TABLET | ORAL | 3 refills | Status: DC

## 2019-09-24 MED ORDER — AMLODIPINE BESYLATE 2.5 MG PO TABS: ORAL_TABLET | ORAL | 3 refills | Status: DC

## 2019-09-24 MED ORDER — AZELASTINE HCL 0.1 % NA SOLN: NASAL | 11 refills | Status: DC

## 2019-09-24 MED ORDER — LEVOCETIRIZINE DIHYDROCHLORIDE 5 MG PO TABS: ORAL_TABLET | ORAL | 3 refills | Status: DC

## 2019-09-24 NOTE — Progress Notes (Signed)
   Subjective:    Patient ID: Gina Espinoza, female    DOB: 29-Nov-1960, 59 y.o.   MRN: [id number]  HPI She is here for complete examination.  She is disabled apparently from fibromyalgia.  She is on multiple pain medications as well as muscle relaxer medications for this and is being taken care of by her gynecologist Dr. Helane Rima She also has difficulty with constipation and is using Linzess.  She has seen Dr. Ardis Hughs for this.  Her allergies seem to be under control on her present medication regimen.  She is also taking Crestor without difficulty.  She has had some bladder related issues but presently is not taking Myrbetriq or Urecholine.  She does not smoke or drink.  Why you in here presently she is not on any antidepressants 8 family and social history as well as health maintenance and immunizations was reviewed.  Review of Systems  All other systems reviewed and are negative.      Objective:   Physical Exam Alert and in no distress. Tympanic membranes and canals are normal. Pharyngeal area is normal. Neck is supple without adenopathy or thyromegaly. Cardiac exam shows a regular sinus rhythm without murmurs or gallops. Lungs are clear to auscultation.  Abdominal exam shows no masses or tenderness with normal bowel sounds.       Assessment & Plan:  Routine general medical examination at a health care facility - Plan: CBC with Differential/Platelet, Comprehensive metabolic panel, Lipid panel  Chronic seasonal allergic rhinitis - Plan: azelastine (ASTELIN) 0.1 % nasal spray, levocetirizine (XYZAL) 5 MG tablet  Allergic rhinitis, unspecified seasonality, unspecified trigger - Plan: azelastine (ASTELIN) 0.1 % nasal spray  Essential hypertension - Plan: amLODipine (NORVASC) 2.5 MG tablet, CBC with Differential/Platelet, Comprehensive metabolic panel  Hyperlipidemia LDL goal <100 - Plan: rosuvastatin (CRESTOR) 5 MG tablet, Lipid panel  Fibromyalgia syndrome  Depression, major, recurrent,  moderate (HCC)  Constipation, unspecified constipation type  Not currently working due to disabled status She will continue to get her pain meds etc. from her gynecologist.  I will continue with her blood pressure medications as well as allergy meds and statin.

## 2019-09-25 LAB — CBC WITH DIFFERENTIAL/PLATELET
Basophils Absolute: 0 10*3/uL (ref 0.0–0.2)
Basos: 1 %
EOS (ABSOLUTE): 0 10*3/uL (ref 0.0–0.4)
Eos: 0 %
Hematocrit: 40.4 % (ref 34.0–46.6)
Hemoglobin: 13.2 g/dL (ref 11.1–15.9)
Immature Grans (Abs): 0 10*3/uL (ref 0.0–0.1)
Immature Granulocytes: 0 %
Lymphocytes Absolute: 1.7 10*3/uL (ref 0.7–3.1)
Lymphs: 32 %
MCH: 25.8 pg — ABNORMAL LOW (ref 26.6–33.0)
MCHC: 32.7 g/dL (ref 31.5–35.7)
MCV: 79 fL (ref 79–97)
Monocytes Absolute: 0.4 10*3/uL (ref 0.1–0.9)
Monocytes: 7 %
Neutrophils Absolute: 3.2 10*3/uL (ref 1.4–7.0)
Neutrophils: 60 %
Platelets: 345 10*3/uL (ref 150–450)
RBC: 5.12 x10E6/uL (ref 3.77–5.28)
RDW: 13.4 % (ref 11.7–15.4)
WBC: 5.3 10*3/uL (ref 3.4–10.8)

## 2019-09-25 LAB — LIPID PANEL
Chol/HDL Ratio: 3.1 ratio (ref 0.0–4.4)
Cholesterol, Total: 147 mg/dL (ref 100–199)
HDL: 47 mg/dL (ref >39)
LDL Chol Calc (NIH): 87 mg/dL (ref 0–99)
Triglycerides: 61 mg/dL (ref 0–149)
VLDL Cholesterol Cal: 13 mg/dL (ref 5–40)

## 2019-09-25 LAB — COMPREHENSIVE METABOLIC PANEL
ALT: 8 IU/L (ref 0–32)
AST: 19 IU/L (ref 0–40)
Albumin/Globulin Ratio: 1.4 (ref 1.2–2.2)
Albumin: 4.5 g/dL (ref 3.8–4.9)
Alkaline Phosphatase: 135 IU/L — ABNORMAL HIGH (ref 39–117)
BUN/Creatinine Ratio: 10 (ref 9–23)
BUN: 10 mg/dL (ref 6–24)
Bilirubin Total: 0.6 mg/dL (ref 0.0–1.2)
CO2: 25 mmol/L (ref 20–29)
Calcium: 9.8 mg/dL (ref 8.7–10.2)
Chloride: 102 mmol/L (ref 96–106)
Creatinine, Ser: 1.01 mg/dL — ABNORMAL HIGH (ref 0.57–1.00)
GFR calc Af Amer: 70 mL/min/{1.73_m2} (ref >59)
GFR calc non Af Amer: 61 mL/min/{1.73_m2} (ref >59)
Globulin, Total: 3.3 g/dL (ref 1.5–4.5)
Glucose: 98 mg/dL (ref 65–99)
Potassium: 4.7 mmol/L (ref 3.5–5.2)
Sodium: 140 mmol/L (ref 134–144)
Total Protein: 7.8 g/dL (ref 6.0–8.5)

## 2019-10-14 DIAGNOSIS — M797 Fibromyalgia: Secondary | ICD-10-CM | POA: Diagnosis not present

## 2019-10-14 DIAGNOSIS — G8929 Other chronic pain: Secondary | ICD-10-CM | POA: Diagnosis not present

## 2019-10-14 DIAGNOSIS — Z760 Encounter for issue of repeat prescription: Secondary | ICD-10-CM | POA: Diagnosis not present

## 2019-10-14 DIAGNOSIS — L293 Anogenital pruritus, unspecified: Secondary | ICD-10-CM | POA: Diagnosis not present

## 2019-11-03 DIAGNOSIS — M797 Fibromyalgia: Secondary | ICD-10-CM | POA: Diagnosis not present

## 2019-11-03 DIAGNOSIS — L299 Pruritus, unspecified: Secondary | ICD-10-CM | POA: Diagnosis not present

## 2019-11-03 DIAGNOSIS — E559 Vitamin D deficiency, unspecified: Secondary | ICD-10-CM | POA: Diagnosis not present

## 2019-11-03 DIAGNOSIS — Z760 Encounter for issue of repeat prescription: Secondary | ICD-10-CM | POA: Diagnosis not present

## 2019-11-03 DIAGNOSIS — L732 Hidradenitis suppurativa: Secondary | ICD-10-CM | POA: Diagnosis not present

## 2019-11-27 DIAGNOSIS — M797 Fibromyalgia: Secondary | ICD-10-CM | POA: Diagnosis not present

## 2019-11-27 DIAGNOSIS — L732 Hidradenitis suppurativa: Secondary | ICD-10-CM | POA: Diagnosis not present

## 2019-11-27 DIAGNOSIS — Z760 Encounter for issue of repeat prescription: Secondary | ICD-10-CM | POA: Diagnosis not present

## 2019-12-07 ENCOUNTER — Other Ambulatory Visit: Payer: Self-pay | Admitting: Family Medicine

## 2019-12-07 DIAGNOSIS — K219 Gastro-esophageal reflux disease without esophagitis: Secondary | ICD-10-CM

## 2019-12-23 ENCOUNTER — Other Ambulatory Visit: Payer: Self-pay | Admitting: Family Medicine

## 2019-12-23 DIAGNOSIS — R079 Chest pain, unspecified: Secondary | ICD-10-CM

## 2020-01-14 DIAGNOSIS — G8929 Other chronic pain: Secondary | ICD-10-CM | POA: Diagnosis not present

## 2020-01-14 DIAGNOSIS — Z760 Encounter for issue of repeat prescription: Secondary | ICD-10-CM | POA: Diagnosis not present

## 2020-01-14 DIAGNOSIS — L732 Hidradenitis suppurativa: Secondary | ICD-10-CM | POA: Diagnosis not present

## 2020-02-09 DIAGNOSIS — Z1329 Encounter for screening for other suspected endocrine disorder: Secondary | ICD-10-CM | POA: Diagnosis not present

## 2020-02-09 DIAGNOSIS — Z13220 Encounter for screening for lipoid disorders: Secondary | ICD-10-CM | POA: Diagnosis not present

## 2020-02-09 DIAGNOSIS — Z6824 Body mass index (BMI) 24.0-24.9, adult: Secondary | ICD-10-CM | POA: Diagnosis not present

## 2020-02-09 DIAGNOSIS — Z1231 Encounter for screening mammogram for malignant neoplasm of breast: Secondary | ICD-10-CM | POA: Diagnosis not present

## 2020-02-09 DIAGNOSIS — R7309 Other abnormal glucose: Secondary | ICD-10-CM | POA: Diagnosis not present

## 2020-02-09 LAB — HM MAMMOGRAPHY

## 2020-02-24 DIAGNOSIS — L732 Hidradenitis suppurativa: Secondary | ICD-10-CM | POA: Diagnosis not present

## 2020-02-24 DIAGNOSIS — L281 Prurigo nodularis: Secondary | ICD-10-CM | POA: Diagnosis not present

## 2020-02-26 DIAGNOSIS — M8588 Other specified disorders of bone density and structure, other site: Secondary | ICD-10-CM | POA: Diagnosis not present

## 2020-03-01 ENCOUNTER — Telehealth: Payer: Self-pay | Admitting: Gastroenterology

## 2020-03-02 MED ORDER — LINACLOTIDE 290 MCG PO CAPS: 290.0000 ug | ORAL_CAPSULE | Freq: Every day | ORAL | 6 refills | Status: DC

## 2020-03-02 MED ORDER — LINACLOTIDE 145 MCG PO CAPS: 145.0000 ug | ORAL_CAPSULE | Freq: Every day | ORAL | 6 refills | Status: DC

## 2020-03-02 NOTE — Telephone Encounter (Signed)
Refills for Linzess 145 and 235mcg sent to pharmacy as requested.

## 2020-03-04 DIAGNOSIS — N3281 Overactive bladder: Secondary | ICD-10-CM | POA: Diagnosis not present

## 2020-03-04 DIAGNOSIS — Z760 Encounter for issue of repeat prescription: Secondary | ICD-10-CM | POA: Diagnosis not present

## 2020-03-04 DIAGNOSIS — M797 Fibromyalgia: Secondary | ICD-10-CM | POA: Diagnosis not present

## 2020-03-24 DIAGNOSIS — M792 Neuralgia and neuritis, unspecified: Secondary | ICD-10-CM | POA: Diagnosis not present

## 2020-03-24 DIAGNOSIS — M797 Fibromyalgia: Secondary | ICD-10-CM | POA: Diagnosis not present

## 2020-03-24 DIAGNOSIS — Z760 Encounter for issue of repeat prescription: Secondary | ICD-10-CM | POA: Diagnosis not present

## 2020-04-15 ENCOUNTER — Emergency Department (HOSPITAL_COMMUNITY)
Admission: EM | Admit: 2020-04-15 | Discharge: 2020-04-15 | Disposition: A | Discharge status: Home / Self Care | Payer: Medicare Other | Attending: Emergency Medicine | Admitting: Emergency Medicine

## 2020-04-15 ENCOUNTER — Other Ambulatory Visit: Payer: Self-pay

## 2020-04-15 DIAGNOSIS — Z79899 Other long term (current) drug therapy: Secondary | ICD-10-CM | POA: Diagnosis not present

## 2020-04-15 DIAGNOSIS — W274XXA Contact with kitchen utensil, initial encounter: Secondary | ICD-10-CM | POA: Diagnosis not present

## 2020-04-15 DIAGNOSIS — I10 Essential (primary) hypertension: Secondary | ICD-10-CM | POA: Diagnosis not present

## 2020-04-15 DIAGNOSIS — S61111A Laceration without foreign body of right thumb with damage to nail, initial encounter: Secondary | ICD-10-CM

## 2020-04-15 NOTE — ED Triage Notes (Signed)
Patient reports she was using her Mandolin and cut her right thumb. Patient reports the bleeding has been steady x4 hours

## 2020-04-15 NOTE — Discharge Instructions (Addendum)
No need for laceration repair in the ER. Our records show you had your tetanus updated May 2020  Keep wound clean  Rinse under water and soap (as tolerated) at least twice daily. Apply over the counter antibacterial ointment (bacitracin) twice daily. Keep covered with dressing until scab forms.  Ibuprofen or acetaminophen for pain as needed every 6-8 hours.    Return for signs of infection like increased pain, redness, warmth, pus, fevers

## 2020-04-15 NOTE — ED Provider Notes (Signed)
[facility] DEPT Provider Note   CSN: [id number] Arrival date & time: 04/15/20  1652     History Chief Complaint  Patient presents with  . Laceration    Gina Espinoza is a 59 y.o. female presents to ER for evaluation of right thumb laceration that occurred approximately 4 hours ago.  She was using a potato slicer and accidentally cut her thumb. She is RHD. Reports associated throbbing pain. Washed it with cold water and placed dressing on top, did not stop oozing blood so came to ER for evaluation. Tetanus May 2020.   HPI     Past Medical History:  Diagnosis Date  . Anxiety   . Chest pain, unspecified   . Cough   . Depression   . Fibromyalgia   . Headache(784.0)   . Other and unspecified hyperlipidemia    mixed  . Personal history of unspecified circulatory disease   . Unspecified essential hypertension     Patient Active Problem List   Diagnosis Date Noted  . Not currently working due to disabled status 09/24/2019  . Allergic rhinitis 09/24/2019  . Abdominal bloating 08/27/2017  . Nausea 08/27/2017  . 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 Depression, major, recurrent, moderate ([place]) 05/28/2015  . Abdominal pain 02/10/2013  . Constipation 02/10/2013  . Fibromyalgia syndrome 11/22/2011  . Headache 11/20/2008  . Hyperlipidemia LDL goal <100 11/02/2008  . Essential hypertension 11/02/2008  . Chest pain 11/02/2008  . History of cardiovascular disorder 11/02/2008    Past Surgical History:  Procedure Laterality Date  . anterior perineoplasty     posterior repair. 05/16/02  . BLADDER SUSPENSION    . CARDIAC CATHETERIZATION  2006   normal coronaries  . ESOPHAGOGASTRODUODENOSCOPY N/A 02/09/2013   Procedure: ESOPHAGOGASTRODUODENOSCOPY (EGD);  Surgeon: Milus Banister, MD;  Location: [facility];  Service: Endoscopy;  Laterality: N/A;  . excision sebaceous cyst  11/2013   posterior scalp  .  HEMATOMA EVACUATION     vaginal hematoma 05/23/02  . shoulder surgery     sebacceous cyst removal  . UTERINE SUSPENSION       OB History   No obstetric history on file.     Family History  Problem Relation Age of Onset  . Allergies Mother        also children  . Heart disease Father   . Asthma Daughter   . Breast cancer Maternal Grandmother   . Colon cancer Brother   . Asthma Grandchild     Social History   Tobacco Use  . Smoking status: Never Smoker  . Smokeless tobacco: Never Used  Vaping Use  . Vaping Use: Never used  Substance Use Topics  . Alcohol use: No  . Drug use: No    Home Medications Prior to Admission medications   Medication Sig Start Date End Date Taking? Authorizing Provider  ALPRAZolam (XANAX) 1 MG tablet TAKE 1 TABLET EVERY 8 HOURS AS NEEDED FOR ANXIETY 05/18/15   [provider]  alprazolam Duanne Moron) 2 MG tablet Take 2 mg by mouth every 4 (four) hours as needed for sleep.    [provider]  amLODipine (NORVASC) 2.5 MG tablet TAKE 1 TABLET (2.5 MG TOTAL) BY MOUTH DAILY. 09/24/19   Denita Lung, MD  azelastine (ASTELIN) 0.1 % nasal spray PLACE 2 SPRAYS INTO BOTH NOSTRILS 2 (TWO) TIMES DAILY. USE IN EACH NOSTRIL AS DIRECTED 09/24/19   Denita Lung, MD  bethanechol (  URECHOLINE) 10 MG tablet Take 10 mg by mouth 3 (three) times daily as needed (bladder spasms).    [provider]  budesonide (RHINOCORT AQUA) 32 MCG/ACT nasal spray Place 2 sprays into both nostrils daily. 09/23/18   Denita Lung, MD  diazepam (VALIUM) 5 MG tablet Take 5 mg by mouth every 6 (six) hours as needed for anxiety (1-2 tabs at a time).    [provider]  diclofenac sodium (VOLTAREN) 1 % GEL 3 grams to 3 large joints up to 3 times daily 05/06/18   Bo Merino, MD  EPINEPHrine 0.3 mg/0.3 mL IJ SOAJ injection Inject 0.3 mLs (0.3 mg total) into the muscle once. 12/07/14   Rita Ohara, MD  erythromycin ophthalmic ointment 1 application as needed.      [provider]  escitalopram (LEXAPRO) 20 MG tablet Take 20 mg by mouth daily.    [provider]  fentaNYL (DURAGESIC - DOSED MCG/HR) 50 MCG/HR Place 50 mcg onto the skin as needed.     [provider]  fluconazole (DIFLUCAN) 150 MG tablet Take 150 mg by mouth as needed.     [provider]  gabapentin (NEURONTIN) 300 MG capsule TAKE 1 CAPSULE(S) IN THE MORNING , THEN 2 CAPSULES AT NIGHT 03/29/18   [provider]  HYDROcodone-acetaminophen (NORCO) 10-325 MG tablet Take 1 tablet by mouth every 6 (six) hours as needed.    [provider]  HYDROmorphone (DILAUDID) 2 MG tablet Take 1 tablet (2 mg total) by mouth every 4 (four) hours as needed for pain. 02/12/13   Kinnie Feil, MD  levocetirizine (XYZAL) 5 MG tablet TAKE 1 TABLET BY MOUTH EVERY DAY IN THE EVENING 09/24/19   Denita Lung, MD  lidocaine (XYLOCAINE) 2 % solution Use a small amount in the mouth prior to eating. 12/13/15   Denita Lung, MD  lidocaine (XYLOCAINE) 5 % ointment Apply 1 application topically as needed.    [provider]  linaclotide Rolan Lipa) 145 MCG CAPS capsule Take 1 capsule (145 mcg total) by mouth daily before breakfast. 03/02/20   Milus Banister, MD  linaclotide [facility]) 290 MCG CAPS capsule Take 1 capsule (290 mcg total) by mouth daily before breakfast. 03/02/20   Milus Banister, MD  metaxalone (SKELAXIN) 800 MG tablet Take 800 mg by mouth 2 (two) times daily as needed (muscle spasms).     [provider]  methocarbamol (ROBAXIN) 500 MG tablet Take 500 mg by mouth every 8 (eight) hours as needed for muscle spasms.    [provider]  mirabegron ER (MYRBETRIQ) 25 MG TB24 tablet Take 25 mg by mouth as needed.     [provider]  montelukast (SINGULAIR) 10 MG tablet TAKE 1 TABLET BY MOUTH EVERYDAY AT BEDTIME 12/30/18   Denita Lung, MD  morphine (MS CONTIN) 15 MG 12 hr tablet Take 1 tablet (15 mg total) by mouth 2  (two) times daily as needed for pain. Patient not taking: Reported on 09/24/2019 02/12/13   Kinnie Feil, MD  Na Sulfate-K Sulfate-Mg Sulf 17.5-3.13-1.6 GM/177ML SOLN Suprep-Use as directed 08/13/19   Willia Craze, NP  nitroGLYCERIN (NITROSTAT) 0.4 MG SL tablet DISSOLVE 1 TABLET UNDER TONGUE AS NEEDED FOR CHEST PAIN 12/23/19   Denita Lung, MD  olopatadine (PATANOL) 0.1 % ophthalmic solution Place 1 drop into both eyes 2 (two) times daily as needed for allergies.     [provider]  omeprazole (PRILOSEC) 20 MG capsule  Take 1 capsule (20 mg total) by mouth 2 (two) times daily before a meal. 07/17/17 05/06/18  Milus Banister, MD  Oxycodone HCl 10 MG TABS Take 10 mg by mouth as needed.    [provider]  pantoprazole (PROTONIX) 40 MG tablet TAKE 1 TABLET BY MOUTH EVERY DAY AT 6PM 12/08/19   Denita Lung, MD  polyethylene glycol [facility] / GLYCOLAX) packet Take 17 g by mouth 2 (two) times daily. Patient taking differently: Take 17 g by mouth daily.  02/12/13   Kinnie Feil, MD  predniSONE (DELTASONE) 10 MG tablet TAKE 1 TAB THREE TIMES A DAY FOR 2 DAYS, 1 TAB TWICE A DAY FOR 5 DAYS, 1 TAB DAILY TILL FINISHED 03/29/18   [provider]  promethazine (PHENERGAN) 25 MG tablet Take 25 mg by mouth every 6 (six) hours as needed for nausea or vomiting.    [provider]  Prucalopride Succinate (MOTEGRITY) 2 MG TABS Take 1 tablet (2 mg total) by mouth daily. 08/26/19   Milus Banister, MD  rosuvastatin (CRESTOR) 5 MG tablet TAKE 1 TABLET BY MOUTH EVERYDAY AT BEDTIME 09/24/19   Denita Lung, MD  tamsulosin (FLOMAX) 0.4 MG CAPS capsule Take 0.4 mg by mouth daily. 04/28/15   [provider]  tiZANidine (ZANAFLEX) 2 MG tablet TAKE (1) TABLET BY MOUTH EVERY 6 TO 8 HOURS AS NEEDED**MAXIMUM OF 3 DOSES IN 24 HOURS** 05/12/15   [provider]  triamterene-hydrochlorothiazide (MAXZIDE) 75-50 MG tablet TAKE 1 TABLET BY MOUTH EVERY DAY AS NEEDED FOR FLUID  RETENTION 05/05/15   [provider]  valACYclovir (VALTREX) 500 MG tablet Take 500 mg by mouth daily as needed (infection).     [provider]  zolpidem (AMBIEN) 10 MG tablet Take 1 tablet (10 mg total) by mouth every other day. Alternates with temazepam 02/12/13   Kinnie Feil, MD    Allergies    Savella [milnacipran hcl], Amitiza [lubiprostone], Bee venom, Codeine, and Ivp dye [iodinated diagnostic agents]  Review of Systems   Review of Systems  Skin: Positive for wound.  All other systems reviewed and are negative.   Physical Exam Updated Vital Signs BP (!) 159/98 (BP Location: Left Arm)   Pulse 66   Temp 99.1 F (37.3 C)   Resp 17   LMP  (LMP Unknown)   SpO2 99%   Physical Exam Constitutional:      Appearance: She is well-developed.  HENT:     Head: Normocephalic.     Nose: Nose normal.  Eyes:     General: Lids are normal.  Cardiovascular:     Rate and Rhythm: Normal rate.  Pulmonary:     Effort: Pulmonary effort is normal. No respiratory distress.  Musculoskeletal:        General: Normal range of motion.     Cervical back: Normal range of motion.  Skin:    Comments: Superficial abrasion right thumb tip < 1 cm, hemostatic with visualized dark clot providing hemostasis. Tender. Minimal involvement of very tip of nail. Full ROM of thumb without pain. Sensation thumb pad to light touch intact.   Neurological:     Mental Status: She is alert.  Psychiatric:        Behavior: Behavior normal.     ED Results / Procedures / Treatments   Labs (all labs ordered are listed, but only abnormal results are displayed) Labs Reviewed - No data to display  EKG None  Radiology No results found.  Procedures Procedures (including critical care time)  Medications Ordered in ED Medications - No data to display  ED Course  I have reviewed the triage vital signs and the nursing notes.  Pertinent labs & imaging results that were available during my  care of the patient were reviewed by me and considered in my medical decision making (see chart for details).    MDM Rules/Calculators/A&P                          Wound care provided here in the ER. I applied xeroform and dressing. Wound care instructions at home discussed with patient. No need for wound repair/sutures. EMR reviewed and shows tetanus May 2020. Return precautions given.   Final Clinical Impression(s) / ED Diagnoses Final diagnoses:  Laceration of right thumb without foreign body with damage to nail, initial encounter    Rx / DC Orders ED Discharge Orders    None       Arlean Hopping 04/15/20 1731    Lacretia Leigh, MD 04/16/20 1700

## 2020-04-27 DIAGNOSIS — H35033 Hypertensive retinopathy, bilateral: Secondary | ICD-10-CM | POA: Diagnosis not present

## 2020-04-27 DIAGNOSIS — H524 Presbyopia: Secondary | ICD-10-CM | POA: Diagnosis not present

## 2020-04-27 DIAGNOSIS — H2513 Age-related nuclear cataract, bilateral: Secondary | ICD-10-CM | POA: Diagnosis not present

## 2020-05-21 ENCOUNTER — Other Ambulatory Visit: Payer: Self-pay | Admitting: Family Medicine

## 2020-05-21 DIAGNOSIS — R079 Chest pain, unspecified: Secondary | ICD-10-CM

## 2020-05-24 NOTE — Telephone Encounter (Signed)
Is this okay to refill? 

## 2020-06-02 DIAGNOSIS — M797 Fibromyalgia: Secondary | ICD-10-CM | POA: Diagnosis not present

## 2020-06-02 DIAGNOSIS — Z760 Encounter for issue of repeat prescription: Secondary | ICD-10-CM | POA: Diagnosis not present

## 2020-06-02 DIAGNOSIS — R609 Edema, unspecified: Secondary | ICD-10-CM | POA: Diagnosis not present

## 2020-07-01 DIAGNOSIS — Z760 Encounter for issue of repeat prescription: Secondary | ICD-10-CM | POA: Diagnosis not present

## 2020-07-01 DIAGNOSIS — R895 Abnormal microbiological findings in specimens from other organs, systems and tissues: Secondary | ICD-10-CM | POA: Diagnosis not present

## 2020-07-01 DIAGNOSIS — M797 Fibromyalgia: Secondary | ICD-10-CM | POA: Diagnosis not present

## 2020-07-12 ENCOUNTER — Other Ambulatory Visit: Payer: Self-pay | Admitting: Family Medicine

## 2020-07-12 DIAGNOSIS — Z91030 Bee allergy status: Secondary | ICD-10-CM

## 2020-07-12 MED ORDER — EPINEPHRINE 0.3 MG/0.3ML IJ SOAJ: 0.3000 mg | Freq: Once | INTRAMUSCULAR | 0 refills | Status: DC

## 2020-08-10 DIAGNOSIS — R895 Abnormal microbiological findings in specimens from other organs, systems and tissues: Secondary | ICD-10-CM | POA: Diagnosis not present

## 2020-08-10 DIAGNOSIS — Z760 Encounter for issue of repeat prescription: Secondary | ICD-10-CM | POA: Diagnosis not present

## 2020-08-10 DIAGNOSIS — M797 Fibromyalgia: Secondary | ICD-10-CM | POA: Diagnosis not present

## 2020-08-19 ENCOUNTER — Other Ambulatory Visit: Payer: Self-pay | Admitting: Family Medicine

## 2020-08-19 DIAGNOSIS — R079 Chest pain, unspecified: Secondary | ICD-10-CM

## 2020-09-02 DIAGNOSIS — M797 Fibromyalgia: Secondary | ICD-10-CM | POA: Diagnosis not present

## 2020-09-02 DIAGNOSIS — Z760 Encounter for issue of repeat prescription: Secondary | ICD-10-CM | POA: Diagnosis not present

## 2020-09-08 ENCOUNTER — Other Ambulatory Visit: Payer: Self-pay | Admitting: Family Medicine

## 2020-09-08 DIAGNOSIS — J302 Other seasonal allergic rhinitis: Secondary | ICD-10-CM

## 2020-09-08 DIAGNOSIS — J309 Allergic rhinitis, unspecified: Secondary | ICD-10-CM

## 2020-09-08 NOTE — Telephone Encounter (Signed)
Has an appt in 2 weeks. Was given a year supply to last until 09/23/20

## 2020-09-22 ENCOUNTER — Ambulatory Visit (INDEPENDENT_AMBULATORY_CARE_PROVIDER_SITE_OTHER): Payer: Medicare Other | Admitting: Gastroenterology

## 2020-09-22 ENCOUNTER — Encounter: Payer: Self-pay | Admitting: Gastroenterology

## 2020-09-22 ENCOUNTER — Other Ambulatory Visit: Payer: BC Managed Care – PPO

## 2020-09-22 DIAGNOSIS — R140 Abdominal distension (gaseous): Secondary | ICD-10-CM | POA: Diagnosis not present

## 2020-09-22 DIAGNOSIS — R109 Unspecified abdominal pain: Secondary | ICD-10-CM | POA: Diagnosis not present

## 2020-09-22 DIAGNOSIS — R142 Eructation: Secondary | ICD-10-CM

## 2020-09-22 MED ORDER — LINACLOTIDE 145 MCG PO CAPS: 145.0000 ug | ORAL_CAPSULE | Freq: Every day | ORAL | 6 refills | Status: DC

## 2020-09-22 MED ORDER — LINACLOTIDE 290 MCG PO CAPS: 290.0000 ug | ORAL_CAPSULE | Freq: Every day | ORAL | 6 refills | Status: DC

## 2020-09-22 NOTE — Patient Instructions (Signed)
If you are age 61 or younger, your body mass index should be between 19-25. Your Body mass index is 26.8 kg/m. If this is out of the aformentioned range listed, please consider follow up with your Primary Care Provider.   Your provider has requested that you go to the basement level for lab work before leaving today. Press "B" on the elevator. The lab is located at the first door on the left as you exit the elevator.  Due to recent changes in healthcare laws, you may see the results of your imaging and laboratory studies on MyChart before your provider has had a chance to review them.  We understand that in some cases there may be results that are confusing or concerning to you. Not all laboratory results come back in the same time frame and the provider may be waiting for multiple results in order to interpret others.  Please give [place] 48 hours in order for your provider to thoroughly review all the results before contacting the office for clarification of your results.   Thank you for entrusting me with your care and choosing [facility].  Dr Ardis Hughs

## 2020-09-22 NOTE — Progress Notes (Signed)
Review of pertinent gastrointestinal problems:  1. Post prandial abd pain,weight loss (hospitalized) Ardis Hughs EGD: 9/2014There was a moderate amount of retained solid and liquid gastric contents without anatomic obstruction. There was mild to moderate distal gastritis which was biopsied to check for H. pylori. The examination was otherwise normal. Path showed no h. Pylori. GES 02/2013 was normal.  EGD February 2019 for dyspepsia Dr. Ardis Hughs found mild nonspecific gastritis.  Biopsies positive for H. pylori, she completed 2-week course of Pylera 2. EGD, colonoscopy Dr. Collene Mares. Colonoscopy Dr. Collene Mares, 02/2007; for guiac Pos stools; "limited by poor prep...large amount of stool in the colon." Findings normal examination to the terminal ileum. Recommended repeat colonoscopy in 10 years. EGD Dr. Collene Mares, 02/2007; for dysphagia, findings, normal examination. I recommend colonoscopy sooner (2014) given the "poor prep" described by Dr. Collene Mares.  3. Constipation:improved with linzess, multiple chronic narcotics likely contribute to her constipation 4. Adenomatous polyp:Colonoscopy Dr. Ardis Hughs 04/2013 (one sub CM adenoma removed, recommended recall at 5 years.).  Colonoscopy February 2019 Dr. Ardis Hughs for hematochezia history of polyps showed left-sided diverticulosis and medium sized internal hemorrhoids.  She was recommended to have repeat colonoscopy at 10-year interval   HPI: This is a very pleasant 60 year old woman  I last saw her here in the office about 1 year ago.  We discussed her chronic constipation.  She understands that daily chronic narcotic medicines certainly can play a role.  Fortunately at that point she was able to remain off of narcotic pain medicines for 2 to 3 months leading into that visit.  We made some dose changes to her Linzess and she was going to take a 290 mcg pills and also a 145 mcg pill of Linzess each once daily.  435 mcg of Linzess daily.  She call for refills of these 2 medicines about 6  months later.  Her weight is down 4 pounds since her last office visit here 1 year ago, same scale.  She tells me overall she is quite happy with her current bowel regimen of Linzess and MiraLAX daily.  She will sometimes take a 145 pill, she will sometimes take a 290 pill, sometimes she will take both.  She will always take MiraLAX.  On this regimen her bowels "work fine"  Lately she has been bothered by a lot of bloating belching postprandial abdominal discomforts.  She feels a lot of the symptoms were happening before she was diagnosed with H. pylori 3 years ago.  She rarely takes narcotic pain medicines however she has multiple ones on her med list.  She never takes NSAIDs.  Her weight is overall stable.  She has not been on proton pump inhibitor for least a week.   ROS: complete GI ROS as described in HPI, all other review negative.  Constitutional:  No unintentional weight loss   Past Medical History:  Diagnosis Date  . Anxiety   . Chest pain, unspecified   . Cough   . Depression   . Fibromyalgia   . Headache(784.0)   . Other and unspecified hyperlipidemia    mixed  . Personal history of unspecified circulatory disease   . Unspecified essential hypertension     Past Surgical History:  Procedure Laterality Date  . anterior perineoplasty     posterior repair. 05/16/02  . BLADDER SUSPENSION    . CARDIAC CATHETERIZATION  2006   normal coronaries  . ESOPHAGOGASTRODUODENOSCOPY N/A 02/09/2013   Procedure: ESOPHAGOGASTRODUODENOSCOPY (EGD);  Surgeon: Milus Banister, MD;  Location: [facility];  Service: Endoscopy;  Laterality: N/A;  . excision sebaceous cyst  11/2013   posterior scalp  . HEMATOMA EVACUATION     vaginal hematoma 05/23/02  . shoulder surgery     sebacceous cyst removal  . UTERINE SUSPENSION      Current Outpatient Medications  Medication Sig Dispense Refill  . ALPRAZolam (XANAX) 1 MG tablet Take 1 mg by mouth every 4 (four) hours as needed.  3  .  alprazolam (XANAX) 2 MG tablet Take 2 mg by mouth every 4 (four) hours as needed for sleep.    Marland Kitchen amLODipine (NORVASC) 2.5 MG tablet TAKE 1 TABLET (2.5 MG TOTAL) BY MOUTH DAILY. 90 tablet 3  . azelastine (ASTELIN) 0.1 % nasal spray PLACE 2 SPRAYS INTO BOTH NOSTRILS 2 (TWO) TIMES DAILY. USE IN EACH NOSTRIL AS DIRECTED (Patient taking differently: Place 2 sprays into both nostrils as needed.) 18 mL 11  . bethanechol (URECHOLINE) 10 MG tablet Take 10 mg by mouth 3 (three) times daily as needed (bladder spasms).    . budesonide (RHINOCORT AQUA) 32 MCG/ACT nasal spray Place 2 sprays into both nostrils daily. (Patient taking differently: Place 2 sprays into both nostrils as needed.) 3 Bottle 3  . diazepam (VALIUM) 5 MG tablet Take 5 mg by mouth every 6 (six) hours as needed for anxiety (1-2 tabs at a time).    Marland Kitchen diclofenac sodium (VOLTAREN) 1 % GEL 3 grams to 3 large joints up to 3 times daily 3 Tube 3  . erythromycin ophthalmic ointment 1 application as needed.     Marland Kitchen escitalopram (LEXAPRO) 20 MG tablet Take 20 mg by mouth as needed.    . fentaNYL (DURAGESIC - DOSED MCG/HR) 50 MCG/HR Place 50 mcg onto the skin as needed.     . gabapentin (NEURONTIN) 300 MG capsule Take 300 mg by mouth See admin instructions. Take 1 tablet in the morning and 1 tablet at night  3  . HYDROcodone-acetaminophen (NORCO) 10-325 MG tablet Take 1 tablet by mouth every 6 (six) hours as needed.    Marland Kitchen HYDROmorphone (DILAUDID) 2 MG tablet Take 1 tablet (2 mg total) by mouth every 4 (four) hours as needed for pain. 10 tablet 0  . levocetirizine (XYZAL) 5 MG tablet TAKE 1 TABLET BY MOUTH EVERY DAY IN THE EVENING 90 tablet 3  . lidocaine (XYLOCAINE) 5 % ointment Apply 1 application topically as needed.    . linaclotide (LINZESS) 145 MCG CAPS capsule Take 1 capsule (145 mcg total) by mouth daily before breakfast. 30 capsule 6  . linaclotide (LINZESS) 290 MCG CAPS capsule Take 1 capsule (290 mcg total) by mouth daily before breakfast. 30  capsule 6  . metaxalone (SKELAXIN) 800 MG tablet Take 800 mg by mouth 2 (two) times daily as needed (muscle spasms).     . methocarbamol (ROBAXIN) 500 MG tablet Take 500 mg by mouth every 8 (eight) hours as needed for muscle spasms.    . mirabegron ER (MYRBETRIQ) 25 MG TB24 tablet Take 25 mg by mouth as needed.     . montelukast (SINGULAIR) 10 MG tablet TAKE 1 TABLET BY MOUTH EVERYDAY AT BEDTIME 90 tablet 0  . morphine (MS CONTIN) 15 MG 12 hr tablet Take 1 tablet (15 mg total) by mouth 2 (two) times daily as needed for pain. 10 tablet 0  . nitroGLYCERIN (NITROSTAT) 0.4 MG SL tablet DISSOLVE 1 TABLET UNDER TONGUE AS NEEDED FOR CHEST PAIN 75 tablet 0  . olopatadine (PATANOL) 0.1 % ophthalmic solution Place  1 drop into both eyes 2 (two) times daily as needed for allergies.     . Oxycodone HCl 10 MG TABS Take 10 mg by mouth as needed.    . pantoprazole (PROTONIX) 40 MG tablet TAKE 1 TABLET BY MOUTH EVERY DAY AT 6PM 90 tablet 3  . polyethylene glycol (MIRALAX / GLYCOLAX) packet Take 17 g by mouth 2 (two) times daily. (Patient taking differently: Take 17 g by mouth daily.) 14 each 0  . promethazine (PHENERGAN) 25 MG tablet Take 25 mg by mouth every 6 (six) hours as needed for nausea or vomiting.    . rosuvastatin (CRESTOR) 5 MG tablet TAKE 1 TABLET BY MOUTH EVERYDAY AT BEDTIME 90 tablet 3  . tamsulosin (FLOMAX) 0.4 MG CAPS capsule Take 0.4 mg by mouth daily.  3  . tiZANidine (ZANAFLEX) 2 MG tablet TAKE (1) TABLET BY MOUTH EVERY 6 TO 8 HOURS AS NEEDED**MAXIMUM OF 3 DOSES IN 24 HOURS**  0  . triamterene-hydrochlorothiazide (MAXZIDE) 75-50 MG tablet TAKE 1 TABLET BY MOUTH EVERY DAY AS NEEDED FOR FLUID RETENTION  12  . valACYclovir (VALTREX) 500 MG tablet Take 500 mg by mouth daily as needed (infection).     Marland Kitchen zolpidem (AMBIEN) 10 MG tablet Take 1 tablet (10 mg total) by mouth every other day. Alternates with temazepam 10 tablet 0   No current facility-administered medications for this visit.     Allergies as of 09/22/2020 - Review Complete 09/22/2020  Allergen Reaction Noted  . Savella [milnacipran hcl] Anaphylaxis 10/14/2011  . Amitiza [lubiprostone]  09/03/2019  . Bee venom Swelling   . Codeine Hives and Itching 11/05/2008  . Ivp dye [iodinated diagnostic agents] Hives and Itching 10/10/2011    Family History  Problem Relation Age of Onset  . Allergies Mother        also children  . Heart disease Father   . Asthma Daughter   . Breast cancer Maternal Grandmother   . Colon cancer Brother   . Asthma Grandchild     Social History   Socioeconomic History  . Marital status: Single    Spouse name: Not on file  . Number of children: 3  . Years of education: Not on file  . Highest education level: Not on file  Occupational History  . Occupation: disabled  Tobacco Use  . Smoking status: Never Smoker  . Smokeless tobacco: Never Used  Vaping Use  . Vaping Use: Never used  Substance and Sexual Activity  . Alcohol use: No  . Drug use: No  . Sexual activity: Not Currently  Other Topics Concern  . Not on file  Social History Narrative   Single, children, works as a Recruitment consultant.  On disability (fibromyalgia, heart, depression/anxiety)   Social Determinants of Health   Financial Resource Strain: Not on file  Food Insecurity: Not on file  Transportation Needs: Not on file  Physical Activity: Not on file  Stress: Not on file  Social Connections: Not on file  Intimate Partner Violence: Not on file     Physical Exam: Ht 5' 5.75" (1.67 m)   Wt 164 lb 12.8 oz (74.8 kg)   LMP  (LMP Unknown)   BMI 26.80 kg/m  Constitutional: generally well-appearing Psychiatric: alert and oriented x3 Abdomen: soft, nontender, nondistended, no obvious ascites, no peritoneal signs, normal bowel sounds No peripheral edema noted in lower extremities  Assessment and plan: 60 y.o. female with bloating, belching, abdominal discomfort after eating  She thinks many of the symptoms  are similar to when she was diagnosed with H. pylori.  Since she has not taken proton pump inhibitor in about a week I think that H. pylori stool antigen should be quite reliable here.  If positive then we will retreat.  She also mentions some intermittent chest discomforts and sweating sensation.  She believes she has been needing more nitroglycerin lately.  She plans to bring this up with her primary care physician later this week.  Please see the "Patient Instructions" section for addition details about the plan.  Owens Loffler, MD [facility] 09/22/2020, 2:22 PM   Total time on date of encounter was 30 minutes (this included time spent preparing to see the patient reviewing records; obtaining and/or reviewing separately obtained history; performing a medically appropriate exam and/or evaluation; counseling and educating the patient and family if present; ordering medications, tests or procedures if applicable; and documenting clinical information in the health record).

## 2020-09-24 ENCOUNTER — Encounter: Payer: Medicare Other | Admitting: Family Medicine

## 2020-09-28 ENCOUNTER — Other Ambulatory Visit: Payer: Self-pay

## 2020-09-28 ENCOUNTER — Encounter: Payer: Self-pay | Admitting: Family Medicine

## 2020-09-28 ENCOUNTER — Ambulatory Visit (INDEPENDENT_AMBULATORY_CARE_PROVIDER_SITE_OTHER): Payer: Medicare Other | Admitting: Family Medicine

## 2020-09-28 VITALS — BP 110/68 | HR 56 | Temp 96.0°F | Ht 66.0 in | Wt 167.6 lb

## 2020-09-28 DIAGNOSIS — Z560 Unemployment, unspecified: Secondary | ICD-10-CM | POA: Diagnosis not present

## 2020-09-28 DIAGNOSIS — E785 Hyperlipidemia, unspecified: Secondary | ICD-10-CM

## 2020-09-28 DIAGNOSIS — I10 Essential (primary) hypertension: Secondary | ICD-10-CM

## 2020-09-28 DIAGNOSIS — N3281 Overactive bladder: Secondary | ICD-10-CM | POA: Insufficient documentation

## 2020-09-28 DIAGNOSIS — R1013 Epigastric pain: Secondary | ICD-10-CM | POA: Diagnosis not present

## 2020-09-28 DIAGNOSIS — R079 Chest pain, unspecified: Secondary | ICD-10-CM | POA: Diagnosis not present

## 2020-09-28 DIAGNOSIS — Z8679 Personal history of other diseases of the circulatory system: Secondary | ICD-10-CM

## 2020-09-28 DIAGNOSIS — J309 Allergic rhinitis, unspecified: Secondary | ICD-10-CM | POA: Diagnosis not present

## 2020-09-28 DIAGNOSIS — F331 Major depressive disorder, recurrent, moderate: Secondary | ICD-10-CM

## 2020-09-28 DIAGNOSIS — K5900 Constipation, unspecified: Secondary | ICD-10-CM

## 2020-09-28 DIAGNOSIS — J302 Other seasonal allergic rhinitis: Secondary | ICD-10-CM

## 2020-09-28 DIAGNOSIS — M8580 Other specified disorders of bone density and structure, unspecified site: Secondary | ICD-10-CM | POA: Diagnosis not present

## 2020-09-28 DIAGNOSIS — R001 Bradycardia, unspecified: Secondary | ICD-10-CM

## 2020-09-28 DIAGNOSIS — M797 Fibromyalgia: Secondary | ICD-10-CM

## 2020-09-28 DIAGNOSIS — E559 Vitamin D deficiency, unspecified: Secondary | ICD-10-CM | POA: Diagnosis not present

## 2020-09-28 DIAGNOSIS — Z0000 Encounter for general adult medical examination without abnormal findings: Secondary | ICD-10-CM

## 2020-09-28 DIAGNOSIS — Z8742 Personal history of other diseases of the female genital tract: Secondary | ICD-10-CM

## 2020-09-28 LAB — POCT URINALYSIS DIP (PROADVANTAGE DEVICE)
Bilirubin, UA: NEGATIVE
Blood, UA: NEGATIVE
Glucose, UA: NEGATIVE mg/dL
Ketones, POC UA: NEGATIVE mg/dL
Leukocytes, UA: NEGATIVE
Nitrite, UA: NEGATIVE
Protein Ur, POC: NEGATIVE mg/dL
Specific Gravity, Urine: 1.03
Urobilinogen, Ur: 0.2
pH, UA: 5.5 (ref 5.0–8.0)

## 2020-09-28 MED ORDER — AMLODIPINE BESYLATE 2.5 MG PO TABS: ORAL_TABLET | ORAL | 3 refills | Status: DC

## 2020-09-28 MED ORDER — AZELASTINE HCL 0.1 % NA SOLN
NASAL | 11 refills | Status: DC
Start: 2020-09-28 — End: 2021-08-31

## 2020-09-28 MED ORDER — NITROGLYCERIN 0.4 MG SL SUBL: SUBLINGUAL_TABLET | SUBLINGUAL | 0 refills | Status: DC

## 2020-09-28 MED ORDER — ROSUVASTATIN CALCIUM 5 MG PO TABS: ORAL_TABLET | ORAL | 3 refills | Status: DC

## 2020-09-28 MED ORDER — MONTELUKAST SODIUM 10 MG PO TABS: ORAL_TABLET | ORAL | 3 refills | Status: DC

## 2020-09-28 NOTE — Progress Notes (Signed)
Gina Espinoza is a 60 y.o. female who presents for annual wellness visit,CPE and follow-up on her various conditions.  She does have underlying fibromyalgia and depression with multiple symptoms relating to doing that.  She is being followed by her gynecologist for that.  She also sees Dr. Ardis Hughs for various GI related problems.  He presently is in the process of evaluating her for H. pylori.  She has a previous history of abnormal Paps and gets regular Paps.  Also her gynecologist is treating her for osteopenia with vitamin D supplementation.  She does have underlying OAB and does use her Myrbetriq and Flomax on an as-needed basis.  Her allergies seem to be under good control.  She does complain of some chest pain with dyspnea on exertion.  She has a previous history of difficulty with chest pain and remote history of a catheterization which apparently did show some coronary artery disease.  She states that she uses nitroglycerin usually once per month.  She does have a history of hypertension and hyperlipidemia.  She is taking amlodipine and Crestor.  Immunizations and Health Maintenance Immunization History  Administered Date(s) Administered  . Hepatitis B 03/10/2008, 04/10/2008, 09/08/2008  . Influenza,inj,Quad PF,6+ Mos 02/11/2013, 06/18/2014, 06/08/2015, 02/24/2016, 02/20/2018  . Influenza-Unspecified 02/19/2018  . MMR 03/10/2008, 04/10/2008  . Td 09/23/2018  . Tdap 03/10/2008, 09/23/2018  . Varicella 02/28/2008, 04/10/2008  . Zoster Recombinat (Shingrix) 11/17/2016   Health Maintenance Due  Topic Date Due  . MAMMOGRAM  01/20/2019  . PAP SMEAR-Modifier  01/20/2020    Last Pap smear:8/31/118 Last mammogram: 01/19/17 Last colonoscopy:06/27/17 Last DEXA: 10/21 Dentist:Q six months Ophtho: Q year Exercise: walking 25 min at least two days a week  Other doctors caring for patient include: Dr. Helane Rima ob; Dr. Ardis Hughs GI  Advanced directives: Does Patient Have a Medical Advance Directive?:  No Would patient like information on creating a medical advance directive?: Yes (ED - Information included in AVS)  Depression screen:  See questionnaire below.  Depression screen [facility] 2/9 09/28/2020 09/28/2020 09/24/2019 09/23/2018  Decreased Interest 0 0 0 0  Down, Depressed, Hopeless 0 0 0 0  PHQ - 2 Score 0 0 0 0  Altered sleeping 3 3 - -  Tired, decreased energy 2 2 - -  Change in appetite 2 2 - -  Feeling bad or failure about yourself  0 0 - -  Trouble concentrating 0 0 - -  Moving slowly or fidgety/restless 0 0 - -  Suicidal thoughts 0 0 - -  PHQ-9 Score 7 7 - -  Difficult doing work/chores Not difficult at all Not difficult at all - -    Fall Risk Screen: see questionnaire below. Fall Risk  09/28/2020 09/28/2020 09/24/2019  Falls in the past year? $RemoveBe'1 1 1  'FFikztNdH$ Number falls in past yr: $Remove'1 1 1  'DOmeHsn$ Injury with Fall? $RemoveBe'1 1 1  'YWDAmAwvk$ Comment - - bruised and scratched knee  Risk for fall due to : Other (Comment) Other (Comment) -  Follow up Falls evaluation completed Falls evaluation completed -    ADL screen:  See questionnaire below Functional Status Survey: Is the patient deaf or have difficulty hearing?: No Does the patient have difficulty seeing, even when wearing glasses/contacts?: Yes Does the patient have difficulty concentrating, remembering, or making decisions?: No Does the patient have difficulty walking or climbing stairs?: Yes Does the patient have difficulty dressing or bathing?: Yes Does the patient have difficulty doing errands alone such as visiting a doctor's office  or shopping?: Yes   Review of Systems Constitutional: -, -unexpected weight change, -anorexia, -fatigue Allergy: -sneezing, -itching, -congestion Dermatology: denies changing moles, rash, lumps ENT: -runny nose, -ear pain, -sore throat,  Cardiology:  -chest pain, -palpitations, -orthopnea, Respiratory: -cough, -shortness of breath, -dyspnea on exertion, -wheezing,  Gastroenterology: -abdominal pain, -nausea,  -vomiting, -diarrhea, -constipation, -dysphagia Hematology: -bleeding or bruising problems Musculoskeletal: -arthralgias, -myalgias, -joint swelling, -back pain, - Ophthalmology: -vision changes,  Urology: -dysuria, -difficulty urinating,  -urinary frequency, -urgency, incontinence Neurology: -, -numbness, , -memory loss, -falls, -dizziness    PHYSICAL EXAM:  BP 110/68   Pulse (!) 56   Temp (!) 96 F (35.6 C)   Ht 5\' 6"  (1.676 m)   Wt 167 lb 9.6 oz (76 kg)   LMP  (LMP Unknown)   SpO2 98%   BMI 27.05 kg/m   General Appearance: Alert, cooperative, no distress, appears stated age Head: Normocephalic, without obvious abnormality, atraumatic Eyes: PERRL, conjunctiva/corneas clear, EOM's intact,  Ears: Normal TM's and external ear canals Nose: Nares normal, mucosa normal, no drainage or sinus tenderness Throat: Lips, mucosa, and tongue normal; teeth and gums normal Neck: Supple, no lymphadenopathy;  thyroid:  no enlargement/tenderness/nodules; no carotid bruit or JVD Lungs: Clear to auscultation bilaterally without wheezes, rales or ronchi; respirations unlabored Heart: Regular rate and rhythm, S1 and S2 normal, no murmur, rubor gallop Abdomen: Soft, non-tender, nondistended, normoactive bowel sounds,  no masses, no hepatosplenomegaly Extremities: No clubbing, cyanosis or edema Pulses: 2+ and symmetric all extremities Skin:  Skin color, texture, turgor normal, no rashes or lesions Lymph nodes: Cervical, supraclavicular, and axillary nodes normal Neurologic:  CNII-XII intact, normal strength, sensation and gait; reflexes 2+ and symmetric throughout Psych: Normal mood, affect, hygiene and grooming. EKG read by me shows a bradycardia with nonspecific ST-T changes similar to previous reading. ASSESSMENT/PLAN: Routine general medical examination at a health care facility - Plan: CBC with Differential/Platelet, POCT Urinalysis DIP (Proadvantage Device)  Fibromyalgia syndrome - Plan:  CBC with Differential/Platelet, Comprehensive metabolic panel  Depression, major, recurrent, moderate (HCC) - Plan: CBC with Differential/Platelet  Not currently working due to disabled status - Plan: CBC with Differential/Platelet  Allergic rhinitis, unspecified seasonality, unspecified trigger - Plan: CBC with Differential/Platelet, azelastine (ASTELIN) 0.1 % nasal spray, montelukast (SINGULAIR) 10 MG tablet  Hyperlipidemia LDL goal <100 - Plan: CBC with Differential/Platelet, Lipid panel, rosuvastatin (CRESTOR) 5 MG tablet  Essential hypertension - Plan: CBC with Differential/Platelet, amLODipine (NORVASC) 2.5 MG tablet  History of cardiovascular disorder - Plan: CBC with Differential/Platelet, Ambulatory referral to Cardiology  Chest pain, unspecified type - Plan: CBC with Differential/Platelet, EKG 12-Lead, nitroGLYCERIN (NITROSTAT) 0.4 MG SL tablet, Ambulatory referral to Cardiology  Epigastric pain - Plan: CBC with Differential/Platelet  Constipation, unspecified constipation type - Plan: CBC with Differential/Platelet  Osteopenia, unspecified location - Plan: CBC with Differential/Platelet, VITAMIN D 25 Hydroxy (Vit-D Deficiency, Fractures)  OAB (overactive bladder) - Plan: CBC with Differential/Platelet, POCT Urinalysis DIP (Proadvantage Device)  History of abnormal cervical Pap smear - Plan: CBC with Differential/Platelet  Chronic seasonal allergic rhinitis - Plan: azelastine (ASTELIN) 0.1 % nasal spray, montelukast (SINGULAIR) 10 MG tablet  Bradycardia Since she is having difficulty with chest pain I think cardiac evaluation is reasonable.  Continue on her present medications.  Continue to follow-up with Dr. Ammie Ferrier concerning her fibromyalgia.  Continue with gastroenterology for the various GI complaints.    Discussed monthly self breast exams and yearly mammograms;  healthy diet, including goals of calcium and vitamin D intake  Immunization recommendations discussed.   She needs to get the second Shingrix.  Discussed COVID-vaccine however at this point she is not interested.  Colonoscopy recommendations reviewed   Medicare Attestation I have personally reviewed: The patient's medical and social history Their use of alcohol, tobacco or illicit drugs Their current medications and supplements The patient's functional ability including ADLs,fall risks, home safety risks, cognitive, and hearing and visual impairment Diet and physical activities Evidence for depression or mood disorders  The patient's weight, height, and BMI have been recorded in the chart.  I have made referrals, counseling, and provided education to the patient based on review of the above and I have provided the patient with a written personalized care plan for preventive services.     Jill Alexanders, MD   09/28/2020

## 2020-09-29 ENCOUNTER — Encounter: Payer: Self-pay | Admitting: Family Medicine

## 2020-09-29 ENCOUNTER — Other Ambulatory Visit: Payer: Medicare Other

## 2020-09-29 DIAGNOSIS — R142 Eructation: Secondary | ICD-10-CM | POA: Diagnosis not present

## 2020-09-29 DIAGNOSIS — R109 Unspecified abdominal pain: Secondary | ICD-10-CM | POA: Diagnosis not present

## 2020-09-29 DIAGNOSIS — R140 Abdominal distension (gaseous): Secondary | ICD-10-CM | POA: Diagnosis not present

## 2020-09-29 LAB — CBC WITH DIFFERENTIAL/PLATELET
Basophils Absolute: 0 10*3/uL (ref 0.0–0.2)
Basos: 1 %
EOS (ABSOLUTE): 0.1 10*3/uL (ref 0.0–0.4)
Eos: 1 %
Hematocrit: 38.2 % (ref 34.0–46.6)
Hemoglobin: 12.2 g/dL (ref 11.1–15.9)
Immature Grans (Abs): 0 10*3/uL (ref 0.0–0.1)
Immature Granulocytes: 0 %
Lymphocytes Absolute: 1.8 10*3/uL (ref 0.7–3.1)
Lymphs: 40 %
MCH: 25.5 pg — ABNORMAL LOW (ref 26.6–33.0)
MCHC: 31.9 g/dL (ref 31.5–35.7)
MCV: 80 fL (ref 79–97)
Monocytes Absolute: 0.3 10*3/uL (ref 0.1–0.9)
Monocytes: 8 %
Neutrophils Absolute: 2.3 10*3/uL (ref 1.4–7.0)
Neutrophils: 50 %
Platelets: 306 10*3/uL (ref 150–450)
RBC: 4.79 x10E6/uL (ref 3.77–5.28)
RDW: 13.6 % (ref 11.7–15.4)
WBC: 4.6 10*3/uL (ref 3.4–10.8)

## 2020-09-29 LAB — COMPREHENSIVE METABOLIC PANEL
ALT: 12 IU/L (ref 0–32)
AST: 20 IU/L (ref 0–40)
Albumin/Globulin Ratio: 1.6 (ref 1.2–2.2)
Albumin: 4.5 g/dL (ref 3.8–4.9)
Alkaline Phosphatase: 126 IU/L — ABNORMAL HIGH (ref 44–121)
BUN/Creatinine Ratio: 15 (ref 12–28)
BUN: 12 mg/dL (ref 8–27)
Bilirubin Total: 0.4 mg/dL (ref 0.0–1.2)
CO2: 25 mmol/L (ref 20–29)
Calcium: 9.5 mg/dL (ref 8.7–10.3)
Chloride: 101 mmol/L (ref 96–106)
Creatinine, Ser: 0.8 mg/dL (ref 0.57–1.00)
Globulin, Total: 2.9 g/dL (ref 1.5–4.5)
Glucose: 115 mg/dL — ABNORMAL HIGH (ref 65–99)
Potassium: 4.6 mmol/L (ref 3.5–5.2)
Sodium: 140 mmol/L (ref 134–144)
Total Protein: 7.4 g/dL (ref 6.0–8.5)
eGFR: 84 mL/min/{1.73_m2} (ref >59)

## 2020-09-29 LAB — LIPID PANEL
Chol/HDL Ratio: 2.7 ratio (ref 0.0–4.4)
Cholesterol, Total: 165 mg/dL (ref 100–199)
HDL: 61 mg/dL (ref >39)
LDL Chol Calc (NIH): 92 mg/dL (ref 0–99)
Triglycerides: 62 mg/dL (ref 0–149)
VLDL Cholesterol Cal: 12 mg/dL (ref 5–40)

## 2020-09-29 LAB — VITAMIN D 25 HYDROXY (VIT D DEFICIENCY, FRACTURES): Vit D, 25-Hydroxy: 111 ng/mL — ABNORMAL HIGH (ref 30.0–100.0)

## 2020-10-01 DIAGNOSIS — Z760 Encounter for issue of repeat prescription: Secondary | ICD-10-CM | POA: Diagnosis not present

## 2020-10-01 DIAGNOSIS — M797 Fibromyalgia: Secondary | ICD-10-CM | POA: Diagnosis not present

## 2020-10-01 LAB — H. PYLORI ANTIGEN, STOOL: H pylori Ag, Stl: NEGATIVE

## 2020-10-12 DIAGNOSIS — M797 Fibromyalgia: Secondary | ICD-10-CM | POA: Diagnosis not present

## 2020-10-12 DIAGNOSIS — R7309 Other abnormal glucose: Secondary | ICD-10-CM | POA: Diagnosis not present

## 2020-10-12 DIAGNOSIS — Z13228 Encounter for screening for other metabolic disorders: Secondary | ICD-10-CM | POA: Diagnosis not present

## 2020-10-12 DIAGNOSIS — Z131 Encounter for screening for diabetes mellitus: Secondary | ICD-10-CM | POA: Diagnosis not present

## 2020-10-25 DIAGNOSIS — M792 Neuralgia and neuritis, unspecified: Secondary | ICD-10-CM | POA: Diagnosis not present

## 2020-10-25 DIAGNOSIS — Z760 Encounter for issue of repeat prescription: Secondary | ICD-10-CM | POA: Diagnosis not present

## 2020-10-25 DIAGNOSIS — M797 Fibromyalgia: Secondary | ICD-10-CM | POA: Diagnosis not present

## 2020-10-25 NOTE — Progress Notes (Signed)
Cardiology Office Note   Date:  11/08/2020   ID:  Espinoza, Gina 18-Apr-1961, MRN [id number]  PCP:  Denita Lung, MD  Cardiologist:   Yaelis Scharfenberg [place], MD   Chief Complaint  Patient presents with   Chest Pain      History of Present Illness: Gina Espinoza is a 60 y.o. female who is seen at the request of Dr Redmond School for evaluation of chest pain. She has a history of HLD and HTN. She has a long history of coronary vasospasm. Was seen by Dr Verl Blalock in the past. In 2006 had an abnormal stress test with inferolateral ST depression. This led to a cardiac cath showing normal coronary arteries. She was treated for coronary spasm with amlodipine and sl Ntg. Last seen in 2014 and symptoms were stable. Since last fall she notes more symptoms of chest pain. It is nitrate responsive. Notes pain in the left peristernal area and left breast radiating to the left scapula. Occurs at rest and not with exertion. Relieved with 1-2 Ntg. Last year was occurring daily. Since then has been less frequent only 1-2 x/week and in the last 3 weeks she has none. No recent change in health status or any new stressors. She does note some intermittent belching and cough unrelated to chest pain.    Past Medical History:  Diagnosis Date   Anxiety    Chest pain, unspecified    Cough    Depression    Fibromyalgia    Headache(784.0)    Other and unspecified hyperlipidemia    mixed   Personal history of unspecified circulatory disease    Unspecified essential hypertension     Past Surgical History:  Procedure Laterality Date   anterior perineoplasty     posterior repair. 05/16/02   BLADDER SUSPENSION     CARDIAC CATHETERIZATION  2006   normal coronaries   ESOPHAGOGASTRODUODENOSCOPY N/A 02/09/2013   Procedure: ESOPHAGOGASTRODUODENOSCOPY (EGD);  Surgeon: Milus Banister, MD;  Location: [place];  Service: Endoscopy;  Laterality: N/A;   excision sebaceous cyst  11/2013   posterior scalp   HEMATOMA  EVACUATION     vaginal hematoma 05/23/02   shoulder surgery     sebacceous cyst removal   UTERINE SUSPENSION       Current Outpatient Medications  Medication Sig Dispense Refill   ALPRAZolam (XANAX) 1 MG tablet Take 1 mg by mouth every 4 (four) hours as needed.  3   alprazolam (XANAX) 2 MG tablet Take 2 mg by mouth every 4 (four) hours as needed for sleep.     amLODipine (NORVASC) 2.5 MG tablet TAKE 1 TABLET (2.5 MG TOTAL) BY MOUTH DAILY. 90 tablet 3   azelastine (ASTELIN) 0.1 % nasal spray PLACE 2 SPRAYS INTO BOTH NOSTRILS 2 (TWO) TIMES DAILY. USE IN EACH NOSTRIL AS DIRECTED 18 mL 11   bethanechol (URECHOLINE) 10 MG tablet Take 10 mg by mouth 3 (three) times daily as needed (bladder spasms).     budesonide (RHINOCORT AQUA) 32 MCG/ACT nasal spray Place 2 sprays into both nostrils daily. (Patient taking differently: Place 2 sprays into both nostrils as needed.) 3 Bottle 3   clindamycin (CLINDAGEL) 1 % gel Apply 1 application topically as needed.     diazepam (VALIUM) 5 MG tablet Take 5 mg by mouth every 6 (six) hours as needed for anxiety (1-2 tabs at a time).     diclofenac sodium (VOLTAREN) 1 % GEL 3 grams to 3 large joints up to  3 times daily 3 Tube 3   erythromycin ophthalmic ointment 1 application as needed.     escitalopram (LEXAPRO) 20 MG tablet Take 20 mg by mouth as needed.     fentaNYL (DURAGESIC - DOSED MCG/HR) 50 MCG/HR Place 50 mcg onto the skin as needed.      gabapentin (NEURONTIN) 300 MG capsule Take 300 mg by mouth See admin instructions. Take 1 tablet in the morning and 1 tablet at night  3   HYDROcodone-acetaminophen (NORCO) 10-325 MG tablet Take 1 tablet by mouth every 6 (six) hours as needed.     HYDROmorphone (DILAUDID) 2 MG tablet Take 1 tablet (2 mg total) by mouth every 4 (four) hours as needed for pain. 10 tablet 0   levocetirizine (XYZAL) 5 MG tablet TAKE 1 TABLET BY MOUTH EVERY DAY IN THE EVENING 90 tablet 3   lidocaine (XYLOCAINE) 5 % ointment Apply 1 application  topically as needed.     linaclotide (LINZESS) 145 MCG CAPS capsule Take 1 capsule (145 mcg total) by mouth daily before breakfast. 30 capsule 6   linaclotide (LINZESS) 290 MCG CAPS capsule Take 1 capsule (290 mcg total) by mouth daily before breakfast. 30 capsule 6   metaxalone (SKELAXIN) 800 MG tablet Take 800 mg by mouth 2 (two) times daily as needed (muscle spasms).      methocarbamol (ROBAXIN) 500 MG tablet Take 500 mg by mouth every 8 (eight) hours as needed for muscle spasms.     mirabegron ER (MYRBETRIQ) 25 MG TB24 tablet Take 25 mg by mouth as needed.      montelukast (SINGULAIR) 10 MG tablet TAKE 1 TABLET BY MOUTH EVERYDAY AT BEDTIME 90 tablet 3   morphine (MS CONTIN) 15 MG 12 hr tablet Take 1 tablet (15 mg total) by mouth 2 (two) times daily as needed for pain. 10 tablet 0   nitroGLYCERIN (NITROSTAT) 0.4 MG SL tablet 1 as needed for chest pain 75 tablet 0   olopatadine (PATANOL) 0.1 % ophthalmic solution Place 1 drop into both eyes 2 (two) times daily as needed for allergies.      Oxycodone HCl 10 MG TABS Take 10 mg by mouth as needed.     pantoprazole (PROTONIX) 40 MG tablet TAKE 1 TABLET BY MOUTH EVERY DAY AT 6PM 90 tablet 3   polyethylene glycol (MIRALAX / GLYCOLAX) packet Take 17 g by mouth 2 (two) times daily. (Patient taking differently: Take 17 g by mouth daily.) 14 each 0   promethazine (PHENERGAN) 25 MG tablet Take 25 mg by mouth every 6 (six) hours as needed for nausea or vomiting.     rosuvastatin (CRESTOR) 5 MG tablet TAKE 1 TABLET BY MOUTH EVERYDAY AT BEDTIME 90 tablet 3   tamsulosin (FLOMAX) 0.4 MG CAPS capsule Take 0.4 mg by mouth daily.  3   tiZANidine (ZANAFLEX) 2 MG tablet Take 2 mg by mouth as needed.  0   traMADol (ULTRAM) 50 MG tablet Take 50 mg by mouth every 6 (six) hours.     triamterene-hydrochlorothiazide (MAXZIDE) 75-50 MG tablet Take 1 tablet by mouth as needed.  12   valACYclovir (VALTREX) 500 MG tablet Take 500 mg by mouth daily as needed (infection).      Vitamin D, Ergocalciferol, (DRISDOL) 1.25 MG (50000 UNIT) CAPS capsule Take 1 capsule by mouth once a week.     zolpidem (AMBIEN) 10 MG tablet Take 1 tablet (10 mg total) by mouth every other day. Alternates with temazepam 10 tablet 0  metoprolol tartrate (LOPRESSOR) 25 MG tablet Take 25 mg by mouth 2 (two) times daily. (Patient not taking: Reported on 11/08/2020)     No current facility-administered medications for this visit.    Allergies:   Savella [milnacipran hcl], Amitiza [lubiprostone], Bee venom, Codeine, and Ivp dye [iodinated diagnostic agents]    Social History:  The patient  reports that she has never smoked. She has never used smokeless tobacco. She reports that she does not drink alcohol and does not use drugs.   Family History:  The patient's family history includes Allergies in her mother; Asthma in her daughter and grandchild; Breast cancer in her maternal grandmother; Colon cancer in her brother; Heart disease in her father.    ROS:  Please see the history of present illness.   Otherwise, review of systems are positive for none.   All other systems are reviewed and negative.    PHYSICAL EXAM: VS:  BP 126/84 (BP Location: Right Arm, Patient Position: Sitting, Cuff Size: Normal)   Pulse (!) 52   Ht 5\' 6"  (1.676 m)   Wt 167 lb 9.6 oz (76 kg)   LMP  (LMP Unknown)   BMI 27.05 kg/m  , BMI Body mass index is 27.05 kg/m. GEN: Well nourished, well developed, in no acute distress  HEENT: normal  Neck: no JVD, carotid bruits, or masses Cardiac: RRR; no murmurs, rubs, or gallops,no edema  Respiratory:  clear to auscultation bilaterally, normal work of breathing GI: soft, nontender, nondistended, + BS MS: no deformity or atrophy  Skin: warm and dry, no rash Neuro:  Strength and sensation are intact Psych: euthymic mood, full affect   EKG:  EKG is ordered today. The ekg ordered today demonstrates Sinus brady with rate 52. Normal. I have personally reviewed and interpreted  this study.    Recent Labs: 09/28/2020: ALT 12; BUN 12; Creatinine, Ser 0.80; Hemoglobin 12.2; Platelets 306; Potassium 4.6; Sodium 140    Lipid Panel    Component Value Date/Time   CHOL 165 09/28/2020 0938   TRIG 62 09/28/2020 0938   HDL 61 09/28/2020 0938   CHOLHDL 2.7 09/28/2020 0938   CHOLHDL 3.0 08/25/2016 0932   VLDL 18 08/25/2016 0932   LDLCALC 92 09/28/2020 0938   LDLDIRECT 161.1 12/17/2008 0901      Wt Readings from Last 3 Encounters:  11/08/20 167 lb 9.6 oz (76 kg)  09/28/20 167 lb 9.6 oz (76 kg)  09/22/20 164 lb 12.8 oz (74.8 kg)      Other studies Reviewed: Additional studies/ records that were reviewed today include: remote cardiac cath report 2006.Marland Kitchen Review of the above records demonstrates: normal coronary arteries   ASSESSMENT AND PLAN:  1.  Chest pain. Based on history she likely has coronary vasospasm similar to symptoms in 2006. It is nitrate responsive. She is on maintenance amlodipine. Has not tolerated higher doses in the past. Given history of HLD and HTN I would like to again make sure she does not have obstructive CAD. Will arrange for a stress Myoview. If normal perfusion will continue to treat for spasm. We discussed the option of coronary CTA but given her contrast allergy she would like to avoid dye.  2. HLD. On crestor.  3. HTN controlled.   Current medicines are reviewed at length with the patient today.  The patient does not have concerns regarding medicines.  The following changes have been made:  no change  Labs/ tests ordered today include: stress Myoview     Disposition:  FU with me after above.  Signed, Jillisa Harris [place], MD  11/08/2020 9:48 AM    [facility] [address], [place], [place], [address] Phone [phone], Fax [phone]

## 2020-11-01 ENCOUNTER — Encounter: Payer: Medicare Other | Admitting: Family Medicine

## 2020-11-08 ENCOUNTER — Ambulatory Visit (INDEPENDENT_AMBULATORY_CARE_PROVIDER_SITE_OTHER): Payer: Medicare Other | Admitting: Cardiology

## 2020-11-08 ENCOUNTER — Encounter: Payer: Self-pay | Admitting: Cardiology

## 2020-11-08 ENCOUNTER — Other Ambulatory Visit: Payer: Self-pay

## 2020-11-08 VITALS — BP 126/84 | HR 52 | Ht 66.0 in | Wt 167.6 lb

## 2020-11-08 DIAGNOSIS — I208 Other forms of angina pectoris: Secondary | ICD-10-CM | POA: Diagnosis not present

## 2020-11-08 DIAGNOSIS — I201 Angina pectoris with documented spasm: Secondary | ICD-10-CM

## 2020-11-08 DIAGNOSIS — E7800 Pure hypercholesterolemia, unspecified: Secondary | ICD-10-CM

## 2020-11-08 DIAGNOSIS — R072 Precordial pain: Secondary | ICD-10-CM

## 2020-11-08 DIAGNOSIS — I10 Essential (primary) hypertension: Secondary | ICD-10-CM | POA: Diagnosis not present

## 2020-11-08 NOTE — Patient Instructions (Signed)
Medication Instructions:   Continue same medications   Lab Work:  None ordered   Testing/Procedures:  Schedule Stress Myoview   Follow-Up: At [facility], you and your health needs are our priority.  As part of our continuing mission to provide you with exceptional heart care, we have created designated Provider Care Teams.  These Care Teams include your primary Cardiologist (physician) and Advanced Practice Providers (APPs -  Physician Assistants and Nurse Practitioners) who all work together to provide you with the care you need, when you need it.  We recommend signing up for the patient portal called "MyChart".  Sign up information is provided on this After Visit Summary.  MyChart is used to connect with patients for Virtual Visits (Telemedicine).  Patients are able to view lab/test results, encounter notes, upcoming appointments, etc.  Non-urgent messages can be sent to your provider as well.   To learn more about what you can do with MyChart, go to [email or web address].    Your next appointment:  To be determined after stress test   The format for your next appointment: Office   Provider:  Dr.Jordan

## 2020-11-08 NOTE — Addendum Note (Signed)
Addended by: Kathyrn Lass on: 11/08/2020 09:54 AM   Modules accepted: Orders

## 2020-11-17 ENCOUNTER — Telehealth (HOSPITAL_COMMUNITY): Payer: Self-pay | Admitting: *Deleted

## 2020-11-17 NOTE — Telephone Encounter (Signed)
Close encounter 

## 2020-11-18 ENCOUNTER — Other Ambulatory Visit: Payer: Self-pay

## 2020-11-18 ENCOUNTER — Ambulatory Visit (HOSPITAL_COMMUNITY)
Admission: RE | Admit: 2020-11-18 | Discharge: 2020-11-18 | Disposition: A | Discharge status: Home / Self Care | Payer: Medicare Other | Source: Ambulatory Visit | Attending: Cardiovascular Disease | Admitting: Cardiovascular Disease

## 2020-11-18 DIAGNOSIS — R072 Precordial pain: Secondary | ICD-10-CM | POA: Diagnosis not present

## 2020-11-18 LAB — MYOCARDIAL PERFUSION IMAGING
LV dias vol: 97 mL (ref 46–106)
LV sys vol: 52 mL
Peak HR: 96 {beats}/min
Rest HR: 50 {beats}/min
SDS: 3
SRS: 0
SSS: 3
TID: 1.11

## 2020-11-18 MED ORDER — AMINOPHYLLINE 25 MG/ML IV SOLN
75.0000 mg | Freq: Once | INTRAVENOUS | Status: AC
  Administered 2020-11-18: 75 mg via INTRAVENOUS

## 2020-11-18 MED ORDER — REGADENOSON 0.4 MG/5ML IV SOLN
0.4000 mg | Freq: Once | INTRAVENOUS | Status: AC
  Administered 2020-11-18: 0.4 mg via INTRAVENOUS

## 2020-11-18 MED ORDER — TECHNETIUM TC 99M TETROFOSMIN IV KIT
32.2000 | PACK | Freq: Once | INTRAVENOUS | Status: AC | PRN
  Administered 2020-11-18: 32.2 via INTRAVENOUS
  Filled 2020-11-18: qty 33

## 2020-11-18 MED ORDER — TECHNETIUM TC 99M TETROFOSMIN IV KIT
9.2000 | PACK | Freq: Once | INTRAVENOUS | Status: AC | PRN
  Administered 2020-11-18: 9.2 via INTRAVENOUS
  Filled 2020-11-18: qty 10

## 2020-11-23 ENCOUNTER — Other Ambulatory Visit: Payer: Self-pay | Admitting: Cardiology

## 2020-11-23 ENCOUNTER — Telehealth: Payer: Self-pay | Admitting: Cardiology

## 2020-11-23 ENCOUNTER — Other Ambulatory Visit: Payer: Self-pay

## 2020-11-23 DIAGNOSIS — R0602 Shortness of breath: Secondary | ICD-10-CM

## 2020-11-23 MED ORDER — ISOSORBIDE MONONITRATE ER 30 MG PO TB24: 30.0000 mg | ORAL_TABLET | Freq: Every day | ORAL | 3 refills | Status: DC

## 2020-11-23 NOTE — Telephone Encounter (Signed)
I called patient with her Myoview results. Will add Imdur 30 mg daily for coronary spasm and I sent in RX. Recommend Echo to better assess LV function. She is agreeable. Should have follow up sometime after Echo done.  Gina Espinoza Martinique MD, [facility]

## 2020-11-23 NOTE — Telephone Encounter (Signed)
Pt is calling in regards to her most recent tests on 11/18/20. Pt does not want to be contacted by a nurse, she would rather be contacted by Dr. Martinique because that's who she is paying per pt.

## 2020-11-23 NOTE — Telephone Encounter (Signed)
ECHO ordered.  Scheduling please call to get patient scheduled.  Thanks!

## 2020-11-24 ENCOUNTER — Telehealth: Payer: Self-pay | Admitting: Cardiology

## 2020-11-24 DIAGNOSIS — Z760 Encounter for issue of repeat prescription: Secondary | ICD-10-CM | POA: Diagnosis not present

## 2020-11-24 DIAGNOSIS — G8929 Other chronic pain: Secondary | ICD-10-CM | POA: Diagnosis not present

## 2020-11-24 DIAGNOSIS — M797 Fibromyalgia: Secondary | ICD-10-CM | POA: Diagnosis not present

## 2020-11-24 NOTE — Telephone Encounter (Signed)
Spoke to patient, she was unaware echo is ultrasound of her heart.   Tried to transfer to get scheduled but unable to reach.  Advised will have scheduler call to arrange.   Patient states she does not have VM and we will be unable to leave a message.

## 2020-11-24 NOTE — Telephone Encounter (Signed)
Pt returned call and wanted to see if Dr. Martinique put in an order for a Ultrasound, I see an order for a Echocardiagram in the system, pt states that's not what Dr. Martinique said he wanted her to have. Please advise pt further

## 2020-11-25 ENCOUNTER — Other Ambulatory Visit: Payer: Self-pay

## 2020-11-25 DIAGNOSIS — R079 Chest pain, unspecified: Secondary | ICD-10-CM

## 2020-12-01 ENCOUNTER — Other Ambulatory Visit: Payer: Self-pay | Admitting: Family Medicine

## 2020-12-01 DIAGNOSIS — K219 Gastro-esophageal reflux disease without esophagitis: Secondary | ICD-10-CM

## 2020-12-01 DIAGNOSIS — J302 Other seasonal allergic rhinitis: Secondary | ICD-10-CM

## 2020-12-10 ENCOUNTER — Other Ambulatory Visit: Payer: Self-pay | Admitting: Family Medicine

## 2020-12-10 DIAGNOSIS — Z91030 Bee allergy status: Secondary | ICD-10-CM

## 2020-12-10 DIAGNOSIS — R079 Chest pain, unspecified: Secondary | ICD-10-CM

## 2020-12-15 ENCOUNTER — Other Ambulatory Visit: Payer: Self-pay

## 2020-12-15 ENCOUNTER — Ambulatory Visit (HOSPITAL_COMMUNITY): Payer: Medicare Other | Attending: Internal Medicine

## 2020-12-15 DIAGNOSIS — R0602 Shortness of breath: Secondary | ICD-10-CM | POA: Diagnosis not present

## 2020-12-15 LAB — ECHOCARDIOGRAM COMPLETE
Area-P 1/2: 3.37 cm2
S' Lateral: 2.9 cm

## 2020-12-29 ENCOUNTER — Telehealth: Payer: Self-pay

## 2020-12-29 MED ORDER — ISOSORBIDE MONONITRATE ER 30 MG PO TB24: ORAL_TABLET | ORAL | 11 refills | Status: DC

## 2020-12-29 NOTE — Telephone Encounter (Signed)
Received a call from patient.Dr.Jordan advised to start Imdur 15 mg daily.Follow up appointment scheduled with Dr.Jordan 03/01/21 at 3:30 pm.

## 2020-12-29 NOTE — Telephone Encounter (Signed)
Spoke to patient last week echo results given.Stated she is still having chest pain.Advised I will let Dr.Jordan know and call her back. Spoke to Wagner he advised to start Imdur 15 mg daily and schedule a follow up appointment. Tried calling patient back several times.No answer.Voice mail full.Unable to leave a message. Spoke to patient's mother this morning.She will have her call me back.

## 2021-01-07 DIAGNOSIS — M797 Fibromyalgia: Secondary | ICD-10-CM | POA: Diagnosis not present

## 2021-02-09 DIAGNOSIS — Z1231 Encounter for screening mammogram for malignant neoplasm of breast: Secondary | ICD-10-CM | POA: Diagnosis not present

## 2021-02-09 DIAGNOSIS — Z779 Other contact with and (suspected) exposures hazardous to health: Secondary | ICD-10-CM | POA: Diagnosis not present

## 2021-02-27 NOTE — Progress Notes (Deleted)
Cardiology Office Note   Date:  02/27/2021   ID:  Percy, Comp 02/06/1961, MRN [id number]  PCP:  Denita Lung, MD  Cardiologist:   Daianna Vasques Martinique, MD   No chief complaint on file.     History of Present Illness: Gina Espinoza is a 60 y.o. female who is seen at the request of Dr Redmond School for evaluation of chest pain. She has a history of HLD and HTN. She has a long history of coronary vasospasm. Was seen by Dr Verl Blalock in the past. In 2006 had an abnormal stress test with inferolateral ST depression. This led to a cardiac cath showing normal coronary arteries. She was treated for coronary spasm with amlodipine and sl Ntg. Last seen in 2014 and symptoms were stable. Since last fall she notes more symptoms of chest pain. It is nitrate responsive. Notes pain in the left peristernal area and left breast radiating to the left scapula. Occurs at rest and not with exertion. Relieved with 1-2 Ntg. Last year was occurring daily. Since then has been less frequent only 1-2 x/week and in the last 3 weeks she has none. No recent change in health status or any new stressors. She does note some intermittent belching and cough unrelated to chest pain.  On her last visit we performed a Myoview study that showed no ischemia. Echo showed normal LV function. She was started on Imdur.     Past Medical History:  Diagnosis Date   Anxiety    Chest pain, unspecified    Cough    Depression    Fibromyalgia    Headache(784.0)    Other and unspecified hyperlipidemia    mixed   Personal history of unspecified circulatory disease    Unspecified essential hypertension     Past Surgical History:  Procedure Laterality Date   anterior perineoplasty     posterior repair. 05/16/02   BLADDER SUSPENSION     CARDIAC CATHETERIZATION  2006   normal coronaries   ESOPHAGOGASTRODUODENOSCOPY N/A 02/09/2013   Procedure: ESOPHAGOGASTRODUODENOSCOPY (EGD);  Surgeon: Milus Banister, MD;  Location: [facility];   Service: Endoscopy;  Laterality: N/A;   excision sebaceous cyst  11/2013   posterior scalp   HEMATOMA EVACUATION     vaginal hematoma 05/23/02   shoulder surgery     sebacceous cyst removal   UTERINE SUSPENSION       Current Outpatient Medications  Medication Sig Dispense Refill   ALPRAZolam (XANAX) 1 MG tablet Take 1 mg by mouth every 4 (four) hours as needed.  3   alprazolam (XANAX) 2 MG tablet Take 2 mg by mouth every 4 (four) hours as needed for sleep.     amLODipine (NORVASC) 2.5 MG tablet TAKE 1 TABLET (2.5 MG TOTAL) BY MOUTH DAILY. 90 tablet 3   azelastine (ASTELIN) 0.1 % nasal spray PLACE 2 SPRAYS INTO BOTH NOSTRILS 2 (TWO) TIMES DAILY. USE IN EACH NOSTRIL AS DIRECTED 18 mL 11   bethanechol (URECHOLINE) 10 MG tablet Take 10 mg by mouth 3 (three) times daily as needed (bladder spasms).     budesonide (RHINOCORT AQUA) 32 MCG/ACT nasal spray Place 2 sprays into both nostrils daily. (Patient taking differently: Place 2 sprays into both nostrils as needed.) 3 Bottle 3   clindamycin (CLINDAGEL) 1 % gel Apply 1 application topically as needed.     diazepam (VALIUM) 5 MG tablet Take 5 mg by mouth every 6 (six) hours as needed for anxiety (1-2 tabs at a  time).     diclofenac sodium (VOLTAREN) 1 % GEL 3 grams to 3 large joints up to 3 times daily 3 Tube 3   erythromycin ophthalmic ointment 1 application as needed.     escitalopram (LEXAPRO) 20 MG tablet Take 20 mg by mouth as needed.     fentaNYL (DURAGESIC - DOSED MCG/HR) 50 MCG/HR Place 50 mcg onto the skin as needed.      gabapentin (NEURONTIN) 300 MG capsule Take 300 mg by mouth See admin instructions. Take 1 tablet in the morning and 1 tablet at night  3   HYDROcodone-acetaminophen (NORCO) 10-325 MG tablet Take 1 tablet by mouth every 6 (six) hours as needed.     HYDROmorphone (DILAUDID) 2 MG tablet Take 1 tablet (2 mg total) by mouth every 4 (four) hours as needed for pain. 10 tablet 0   isosorbide mononitrate (IMDUR) 30 MG 24 hr tablet  Take 1/2 tablet ( 15 mg ) every morning 45 tablet 11   levocetirizine (XYZAL) 5 MG tablet TAKE 1 TABLET BY MOUTH EVERY DAY IN THE EVENING 90 tablet 2   lidocaine (XYLOCAINE) 5 % ointment Apply 1 application topically as needed.     linaclotide (LINZESS) 145 MCG CAPS capsule Take 1 capsule (145 mcg total) by mouth daily before breakfast. 30 capsule 6   linaclotide (LINZESS) 290 MCG CAPS capsule Take 1 capsule (290 mcg total) by mouth daily before breakfast. 30 capsule 6   metaxalone (SKELAXIN) 800 MG tablet Take 800 mg by mouth 2 (two) times daily as needed (muscle spasms).      methocarbamol (ROBAXIN) 500 MG tablet Take 500 mg by mouth every 8 (eight) hours as needed for muscle spasms.     metoprolol tartrate (LOPRESSOR) 25 MG tablet Take 25 mg by mouth 2 (two) times daily. (Patient not taking: Reported on 11/08/2020)     mirabegron ER (MYRBETRIQ) 25 MG TB24 tablet Take 25 mg by mouth as needed.      montelukast (SINGULAIR) 10 MG tablet TAKE 1 TABLET BY MOUTH EVERYDAY AT BEDTIME 90 tablet 3   morphine (MS CONTIN) 15 MG 12 hr tablet Take 1 tablet (15 mg total) by mouth 2 (two) times daily as needed for pain. 10 tablet 0   nitroGLYCERIN (NITROSTAT) 0.4 MG SL tablet 1 AS NEEDED FOR CHEST PAIN 75 tablet 0   olopatadine (PATANOL) 0.1 % ophthalmic solution Place 1 drop into both eyes 2 (two) times daily as needed for allergies.      Oxycodone HCl 10 MG TABS Take 10 mg by mouth as needed.     pantoprazole (PROTONIX) 40 MG tablet TAKE 1 TABLET BY MOUTH EVERY DAY AT 6PM 90 tablet 2   polyethylene glycol (MIRALAX / GLYCOLAX) packet Take 17 g by mouth 2 (two) times daily. (Patient taking differently: Take 17 g by mouth daily.) 14 each 0   promethazine (PHENERGAN) 25 MG tablet Take 25 mg by mouth every 6 (six) hours as needed for nausea or vomiting.     rosuvastatin (CRESTOR) 5 MG tablet TAKE 1 TABLET BY MOUTH EVERYDAY AT BEDTIME 90 tablet 3   tamsulosin (FLOMAX) 0.4 MG CAPS capsule Take 0.4 mg by mouth daily.   3   tiZANidine (ZANAFLEX) 2 MG tablet Take 2 mg by mouth as needed.  0   traMADol (ULTRAM) 50 MG tablet Take 50 mg by mouth every 6 (six) hours.     triamterene-hydrochlorothiazide (MAXZIDE) 75-50 MG tablet Take 1 tablet by mouth as needed.  12  valACYclovir (VALTREX) 500 MG tablet Take 500 mg by mouth daily as needed (infection).     Vitamin D, Ergocalciferol, (DRISDOL) 1.25 MG (50000 UNIT) CAPS capsule Take 1 capsule by mouth once a week.     zolpidem (AMBIEN) 10 MG tablet Take 1 tablet (10 mg total) by mouth every other day. Alternates with temazepam 10 tablet 0   No current facility-administered medications for this visit.    Allergies:   Savella [milnacipran hcl], Amitiza [lubiprostone], Bee venom, Codeine, and Ivp dye [iodinated diagnostic agents]    Social History:  The patient  reports that she has never smoked. She has never used smokeless tobacco. She reports that she does not drink alcohol and does not use drugs.   Family History:  The patient's family history includes Allergies in her mother; Asthma in her daughter and grandchild; Breast cancer in her maternal grandmother; Colon cancer in her brother; Heart disease in her father.    ROS:  Please see the history of present illness.   Otherwise, review of systems are positive for none.   All other systems are reviewed and negative.    PHYSICAL EXAM: VS:  LMP  (LMP Unknown)  , BMI There is no height or weight on file to calculate BMI. GEN: Well nourished, well developed, in no acute distress  HEENT: normal  Neck: no JVD, carotid bruits, or masses Cardiac: RRR; no murmurs, rubs, or gallops,no edema  Respiratory:  clear to auscultation bilaterally, normal work of breathing GI: soft, nontender, nondistended, + BS MS: no deformity or atrophy  Skin: warm and dry, no rash Neuro:  Strength and sensation are intact Psych: euthymic mood, full affect   EKG:  EKG is ordered today. The ekg ordered today demonstrates Sinus brady  with rate 52. Normal. I have personally reviewed and interpreted this study.    Recent Labs: 09/28/2020: ALT 12; BUN 12; Creatinine, Ser 0.80; Hemoglobin 12.2; Platelets 306; Potassium 4.6; Sodium 140    Lipid Panel    Component Value Date/Time   CHOL 165 09/28/2020 0938   TRIG 62 09/28/2020 0938   HDL 61 09/28/2020 0938   CHOLHDL 2.7 09/28/2020 0938   CHOLHDL 3.0 08/25/2016 0932   VLDL 18 08/25/2016 0932   LDLCALC 92 09/28/2020 0938   LDLDIRECT 161.1 12/17/2008 0901      Wt Readings from Last 3 Encounters:  11/18/20 167 lb (75.8 kg)  11/08/20 167 lb 9.6 oz (76 kg)  09/28/20 167 lb 9.6 oz (76 kg)      Other studies Reviewed: Additional studies/ records that were reviewed today include: remote cardiac cath report 2006.Marland Kitchen Review of the above records demonstrates: normal coronary arteries  Myoview 11/18/20: Study Highlights    Nuclear stress EF: 46%. The left ventricular ejection fraction is mildly decreased (45-54%). T wave inversion was noted during stress in the II, III, aVF, V4, V5, V6, V3 and V2 leads (more prominent than baseline TWI) that resolved in the post-infusion period. May represent episode of vasospasm given patient's history. Normal myocardial perfusion with no evidence of ischemia or infarction. This is an intermediate risk study due to mildly reduced LVEF. Recommend TTE for further evaluation.   Gwyndolyn Kaufman, MD Echo 12/15/20: IMPRESSIONS     1. Left ventricular ejection fraction, by estimation, is 60 to 65%. Left  ventricular ejection fraction by 3D volume is 59 %. The left ventricle has  normal function. The left ventricle has no regional wall motion  abnormalities. Left ventricular diastolic   parameters were  normal. The average left ventricular global longitudinal  strain is -22.7 %. The global longitudinal strain is normal.   2. Right ventricular systolic function is normal. The right ventricular  size is normal. There is normal pulmonary  artery systolic pressure.   3. The mitral valve is normal in structure. No evidence of mitral valve  regurgitation. No evidence of mitral stenosis.   4. The aortic valve is tricuspid. Aortic valve regurgitation is not  visualized.   5. The inferior vena cava is normal in size with greater than 50%  respiratory variability, suggesting right atrial pressure of 3 mmHg.   Conclusion(s)/Recommendation(s): Otherwise normal echocardiogram, with  minor abnormalities described in the report.   ASSESSMENT AND PLAN:  1.  Chest pain. Based on history she likely has coronary vasospasm similar to symptoms in 2006. It is nitrate responsive. She is on maintenance amlodipine. Has not tolerated higher doses in the past. Given history of HLD and HTN I would like to again make sure she does not have obstructive CAD. Will arrange for a stress Myoview. If normal perfusion will continue to treat for spasm. We discussed the option of coronary CTA but given her contrast allergy she would like to avoid dye.  2. HLD. On crestor.  3. HTN controlled.   Current medicines are reviewed at length with the patient today.  The patient does not have concerns regarding medicines.  The following changes have been made:  no change  Labs/ tests ordered today include: stress Myoview     Disposition:   FU with me after above.  Signed, Karenna Romanoff Martinique, MD  02/27/2021 8:19 AM    [facility] [address], [place], [place], [address] Phone [phone], Fax [phone]

## 2021-03-01 ENCOUNTER — Ambulatory Visit: Payer: Medicare Other | Admitting: Cardiology

## 2021-03-10 DIAGNOSIS — R110 Nausea: Secondary | ICD-10-CM | POA: Diagnosis not present

## 2021-03-10 DIAGNOSIS — Z760 Encounter for issue of repeat prescription: Secondary | ICD-10-CM | POA: Diagnosis not present

## 2021-03-10 DIAGNOSIS — G8929 Other chronic pain: Secondary | ICD-10-CM | POA: Diagnosis not present

## 2021-04-19 ENCOUNTER — Other Ambulatory Visit: Payer: Self-pay | Admitting: Gastroenterology

## 2021-06-06 DIAGNOSIS — G8929 Other chronic pain: Secondary | ICD-10-CM | POA: Diagnosis not present

## 2021-06-06 DIAGNOSIS — Z760 Encounter for issue of repeat prescription: Secondary | ICD-10-CM | POA: Diagnosis not present

## 2021-06-06 DIAGNOSIS — M25579 Pain in unspecified ankle and joints of unspecified foot: Secondary | ICD-10-CM | POA: Diagnosis not present

## 2021-06-06 DIAGNOSIS — L293 Anogenital pruritus, unspecified: Secondary | ICD-10-CM | POA: Diagnosis not present

## 2021-06-16 ENCOUNTER — Ambulatory Visit (INDEPENDENT_AMBULATORY_CARE_PROVIDER_SITE_OTHER): Payer: Medicare Other

## 2021-06-16 ENCOUNTER — Other Ambulatory Visit: Payer: Self-pay

## 2021-06-16 ENCOUNTER — Ambulatory Visit (INDEPENDENT_AMBULATORY_CARE_PROVIDER_SITE_OTHER): Payer: Medicare Other | Admitting: Podiatry

## 2021-06-16 ENCOUNTER — Encounter: Payer: Self-pay | Admitting: Podiatry

## 2021-06-16 DIAGNOSIS — M25571 Pain in right ankle and joints of right foot: Secondary | ICD-10-CM

## 2021-06-16 DIAGNOSIS — M25572 Pain in left ankle and joints of left foot: Secondary | ICD-10-CM

## 2021-06-16 DIAGNOSIS — M7751 Other enthesopathy of right foot: Secondary | ICD-10-CM

## 2021-06-16 DIAGNOSIS — M7752 Other enthesopathy of left foot: Secondary | ICD-10-CM

## 2021-06-16 MED ORDER — TRIAMCINOLONE ACETONIDE 10 MG/ML IJ SUSP
20.0000 mg | Freq: Once | INTRAMUSCULAR | Status: AC
  Administered 2021-06-16: 20 mg

## 2021-06-19 NOTE — Progress Notes (Signed)
Subjective:   Patient ID: Gina Espinoza, female   DOB: 61 y.o.   MRN: [id number]   HPI Patient presents stating that she gets pain in both of her ankles and its been present for a number of months and seems to get worse with prolonged weightbearing and is unsure of origin.  Patient does not smoke likes to be active   Review of Systems  All other systems reviewed and are negative.      Objective:  Physical Exam Vitals and nursing note reviewed.  Constitutional:      Appearance: She is well-developed.  Pulmonary:     Effort: Pulmonary effort is normal.  Musculoskeletal:        General: Normal range of motion.  Skin:    General: Skin is warm.  Neurological:     Mental Status: She is alert.    Neurovascular status found to be intact muscle strength was found to be adequate range of motion adequate with inflammation of the sinus tarsi bilateral with inflammation fluid buildup and no indications of restriction of motion crepitus with movement.  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

## 2021-07-01 DIAGNOSIS — R109 Unspecified abdominal pain: Secondary | ICD-10-CM | POA: Diagnosis not present

## 2021-07-01 DIAGNOSIS — I878 Other specified disorders of veins: Secondary | ICD-10-CM | POA: Diagnosis not present

## 2021-07-01 DIAGNOSIS — R319 Hematuria, unspecified: Secondary | ICD-10-CM | POA: Diagnosis not present

## 2021-07-01 DIAGNOSIS — N8111 Cystocele, midline: Secondary | ICD-10-CM | POA: Diagnosis not present

## 2021-07-01 DIAGNOSIS — R1084 Generalized abdominal pain: Secondary | ICD-10-CM | POA: Diagnosis not present

## 2021-07-01 DIAGNOSIS — R350 Frequency of micturition: Secondary | ICD-10-CM | POA: Diagnosis not present

## 2021-07-01 DIAGNOSIS — M797 Fibromyalgia: Secondary | ICD-10-CM | POA: Diagnosis not present

## 2021-07-27 DIAGNOSIS — Z760 Encounter for issue of repeat prescription: Secondary | ICD-10-CM | POA: Diagnosis not present

## 2021-07-27 DIAGNOSIS — M797 Fibromyalgia: Secondary | ICD-10-CM | POA: Diagnosis not present

## 2021-07-27 DIAGNOSIS — E559 Vitamin D deficiency, unspecified: Secondary | ICD-10-CM | POA: Diagnosis not present

## 2021-08-04 DIAGNOSIS — N8111 Cystocele, midline: Secondary | ICD-10-CM | POA: Diagnosis not present

## 2021-08-23 ENCOUNTER — Other Ambulatory Visit: Payer: Self-pay | Admitting: Family Medicine

## 2021-08-23 DIAGNOSIS — K219 Gastro-esophageal reflux disease without esophagitis: Secondary | ICD-10-CM

## 2021-08-23 DIAGNOSIS — J302 Other seasonal allergic rhinitis: Secondary | ICD-10-CM

## 2021-08-25 DIAGNOSIS — E559 Vitamin D deficiency, unspecified: Secondary | ICD-10-CM | POA: Diagnosis not present

## 2021-08-25 DIAGNOSIS — Z760 Encounter for issue of repeat prescription: Secondary | ICD-10-CM | POA: Diagnosis not present

## 2021-08-25 DIAGNOSIS — M797 Fibromyalgia: Secondary | ICD-10-CM | POA: Diagnosis not present

## 2021-08-31 ENCOUNTER — Other Ambulatory Visit: Payer: Self-pay | Admitting: Family Medicine

## 2021-08-31 DIAGNOSIS — J309 Allergic rhinitis, unspecified: Secondary | ICD-10-CM

## 2021-08-31 DIAGNOSIS — J302 Other seasonal allergic rhinitis: Secondary | ICD-10-CM

## 2021-09-22 DIAGNOSIS — K589 Irritable bowel syndrome without diarrhea: Secondary | ICD-10-CM | POA: Diagnosis not present

## 2021-10-06 ENCOUNTER — Other Ambulatory Visit: Payer: Self-pay | Admitting: Family Medicine

## 2021-10-06 DIAGNOSIS — I10 Essential (primary) hypertension: Secondary | ICD-10-CM

## 2021-10-12 ENCOUNTER — Ambulatory Visit: Payer: Medicare Other | Admitting: Physical Therapy

## 2021-10-12 ENCOUNTER — Other Ambulatory Visit: Payer: Self-pay | Admitting: Family Medicine

## 2021-10-12 ENCOUNTER — Ambulatory Visit (INDEPENDENT_AMBULATORY_CARE_PROVIDER_SITE_OTHER): Payer: Medicare Other | Admitting: Family Medicine

## 2021-10-12 ENCOUNTER — Encounter: Payer: Self-pay | Admitting: Family Medicine

## 2021-10-12 VITALS — BP 130/76 | HR 60 | Temp 96.8°F | Ht 66.0 in | Wt 165.0 lb

## 2021-10-12 DIAGNOSIS — N3281 Overactive bladder: Secondary | ICD-10-CM

## 2021-10-12 DIAGNOSIS — F331 Major depressive disorder, recurrent, moderate: Secondary | ICD-10-CM | POA: Diagnosis not present

## 2021-10-12 DIAGNOSIS — Z2821 Immunization not carried out because of patient refusal: Secondary | ICD-10-CM | POA: Diagnosis not present

## 2021-10-12 DIAGNOSIS — I201 Angina pectoris with documented spasm: Secondary | ICD-10-CM | POA: Diagnosis not present

## 2021-10-12 DIAGNOSIS — M8580 Other specified disorders of bone density and structure, unspecified site: Secondary | ICD-10-CM

## 2021-10-12 DIAGNOSIS — M797 Fibromyalgia: Secondary | ICD-10-CM

## 2021-10-12 DIAGNOSIS — R1013 Epigastric pain: Secondary | ICD-10-CM

## 2021-10-12 DIAGNOSIS — J302 Other seasonal allergic rhinitis: Secondary | ICD-10-CM

## 2021-10-12 DIAGNOSIS — Z560 Unemployment, unspecified: Secondary | ICD-10-CM | POA: Diagnosis not present

## 2021-10-12 DIAGNOSIS — E785 Hyperlipidemia, unspecified: Secondary | ICD-10-CM

## 2021-10-12 DIAGNOSIS — J309 Allergic rhinitis, unspecified: Secondary | ICD-10-CM

## 2021-10-12 DIAGNOSIS — R079 Chest pain, unspecified: Secondary | ICD-10-CM

## 2021-10-12 DIAGNOSIS — K5900 Constipation, unspecified: Secondary | ICD-10-CM

## 2021-10-12 DIAGNOSIS — R739 Hyperglycemia, unspecified: Secondary | ICD-10-CM

## 2021-10-12 DIAGNOSIS — Z0000 Encounter for general adult medical examination without abnormal findings: Secondary | ICD-10-CM | POA: Diagnosis not present

## 2021-10-12 DIAGNOSIS — I10 Essential (primary) hypertension: Secondary | ICD-10-CM

## 2021-10-12 DIAGNOSIS — N393 Stress incontinence (female) (male): Secondary | ICD-10-CM

## 2021-10-12 MED ORDER — OLOPATADINE HCL 0.1 % OP SOLN: 1.0000 [drp] | Freq: Two times a day (BID) | OPHTHALMIC | 5 refills | Status: DC | PRN

## 2021-10-12 MED ORDER — NITROGLYCERIN 0.4 MG SL SUBL: 0.4000 mg | SUBLINGUAL_TABLET | SUBLINGUAL | 0 refills | Status: DC | PRN

## 2021-10-12 MED ORDER — ROSUVASTATIN CALCIUM 5 MG PO TABS: ORAL_TABLET | ORAL | 3 refills | Status: DC

## 2021-10-12 MED ORDER — TRIAMTERENE-HCTZ 75-50 MG PO TABS: 1.0000 | ORAL_TABLET | ORAL | 3 refills | Status: DC | PRN

## 2021-10-12 MED ORDER — AZELASTINE HCL 137 MCG/SPRAY NA SOLN: NASAL | 1 refills | Status: DC

## 2021-10-12 MED ORDER — MONTELUKAST SODIUM 10 MG PO TABS: 10.0000 mg | ORAL_TABLET | Freq: Every day | ORAL | 3 refills | Status: DC

## 2021-10-12 MED ORDER — LEVOCETIRIZINE DIHYDROCHLORIDE 5 MG PO TABS: 5.0000 mg | ORAL_TABLET | Freq: Every evening | ORAL | 3 refills | Status: DC

## 2021-10-12 MED ORDER — AMLODIPINE BESYLATE 2.5 MG PO TABS: ORAL_TABLET | ORAL | 3 refills | Status: DC

## 2021-10-12 NOTE — Progress Notes (Signed)
Gina Espinoza is a 61 y.o. female who presents for annual wellness visit,CPE and follow-up on chronic medical conditions.  She has fibromyalgia and is on multiple medications for this.  Her gynecologist, Dr. Despina Hidden is taking care of this.  She gets regular Pap and pelvic through them.  Her allergies are being handled with Singulair, Xyzal.  She would like Patanol to help with the eyes.  She does have constipation and uses Linzess.  She has seen cardiology in the past and does have evidence of coronary artery spasm.  She is not interested in long-term nitrate but does want nitroglycerin.  Continues on pantoprazole for her reflux symptoms and seems to be doing well on that.  Continues on Crestor without any difficulty.  She apparently does have urinary incontinence due to uterine prolapse and is considering having surgery.  She does have osteopenia and is not sure when she had her last scan.  Also blood work did show elevated blood sugar.  Psychologically although she did score in the PHQ, she says she is doing fine.  She is not interested in any immunizations.  Immunizations and Health Maintenance Immunization History  Administered Date(s) Administered   Hepatitis B 03/10/2008, 04/10/2008, 09/08/2008   Influenza,inj,Quad PF,6+ Mos 02/11/2013, 06/18/2014, 06/08/2015, 02/24/2016, 02/20/2018   Influenza-Unspecified 02/19/2018   MMR 03/10/2008, 04/10/2008   Td 09/23/2018   Tdap 03/10/2008, 09/23/2018   Varicella 02/28/2008, 04/10/2008   Zoster Recombinat (Shingrix) 11/17/2016   Health Maintenance Due  Topic Date Due   COVID-19 Vaccine (1) Never done   HIV Screening  Never done   Zoster Vaccines- Shingrix (2 of 2) 01/12/2017    Last Pap smear: 02/09/21  Last mammogram: 02/10/20 Last colonoscopy: 06/27/17 Dr. Ardis Hughs  Last DEXA:  done at ob/gyn Dentist: Q six months  Ophtho: Q year Exercise: two days a week   Other doctors caring for patient include: Dr. Ardis Hughs GI               Dr. Monica Becton  Ob/gyn                 Dr. Diona Fanti urology           Dr. Paulla Dolly podiatry            Dr. Martinique cardiology  Advanced directives: Does Patient Have a Medical Advance Directive?: No Would patient like information on creating a medical advance directive?: Yes (ED - Information included in AVS)  Depression screen:  See questionnaire below.     09/28/2020    8:40 AM 09/28/2020    8:39 AM 09/24/2019    2:46 PM 09/23/2018   10:02 AM  Depression screen PHQ 2/9  Decreased Interest 0 0 0 0  Down, Depressed, Hopeless 0 0 0 0  PHQ - 2 Score 0 0 0 0  Altered sleeping 3 3    Tired, decreased energy 2 2    Change in appetite 2 2    Feeling bad or failure about yourself  0 0    Trouble concentrating 0 0    Moving slowly or fidgety/restless 0 0    Suicidal thoughts 0 0    PHQ-9 Score 7 7    Difficult doing work/chores Not difficult at all Not difficult at all      Fall Risk Screen: see questionnaire below.    10/12/2021    1:58 PM 09/28/2020    8:39 AM 09/28/2020    8:38 AM 09/24/2019    2:46 PM  Fall Risk  Falls in the past year? $RemoveBe'1 1 1 1  'RqRwZqLRN$ Number falls in past yr: $Remove'1 1 1 1  'PwBPgUU$ Injury with Fall? 0 $Remov'1 1 1  'Xkfzrr$ Comment    bruised and scratched knee  Risk for fall due to : History of fall(s) Other (Comment) Other (Comment)   Follow up Falls evaluation completed Falls evaluation completed Falls evaluation completed     ADL screen:  See questionnaire below Functional Status Survey: Is the patient deaf or have difficulty hearing?: No Does the patient have difficulty seeing, even when wearing glasses/contacts?: No Does the patient have difficulty concentrating, remembering, or making decisions?: No Does the patient have difficulty walking or climbing stairs?: Yes Does the patient have difficulty dressing or bathing?: Yes Does the patient have difficulty doing errands alone such as visiting a doctor's office or shopping?: No   Review of Systems Constitutional: -, -unexpected weight change, -anorexia,  -fatigue Allergy: -sneezing, -itching, -congestion Dermatology: denies changing moles, rash, lumps ENT: -runny nose, -ear pain, -sore throat,  Cardiology:  -chest pain, -palpitations, -orthopnea, Respiratory: -cough, -shortness of breath, -dyspnea on exertion, -wheezing,  Gastroenterology: -abdominal pain, -nausea, -vomiting, -diarrhea, -constipation, -dysphagia Hematology: -bleeding or bruising problems Musculoskeletal: -arthralgias, -myalgias, -joint swelling, -back pain, - Ophthalmology: -vision changes,  Urology: -dysuria, -difficulty urinating,  -urinary frequency, -urgency, incontinence Neurology: -, -numbness, , -memory loss, -falls, -dizziness    PHYSICAL EXAM:  LMP  (LMP Unknown)   General Appearance: Alert, cooperative, no distress, appears stated age Head: Normocephalic, without obvious abnormality, atraumatic Eyes: PERRL, conjunctiva/corneas clear, EOM's intact, Ears: Normal TM's and external ear canals Nose: Nares normal, mucosa normal, no drainage or sinus tenderness Throat: Lips, mucosa, and tongue normal; teeth and gums normal Neck: Supple, no lymphadenopathy;  thyroid:  no enlargement/tenderness/nodules; no carotid bruit or JVD Lungs: Clear to auscultation bilaterally without wheezes, rales or ronchi; respirations unlabored Heart: Regular rate and rhythm, S1 and S2 normal, no murmur, rubor gallop Skin:  Skin color, texture, turgor normal, no rashes or lesions Lymph nodes: Cervical, supraclavicular, and axillary nodes normal Neurologic:  CNII-XII intact, normal strength, sensation and gait; reflexes 2+ and symmetric throughout Psych: Normal mood, affect, hygiene and grooming.  ASSESSMENT/PLAN: Routine general medical examination at a health care facility  Fibromyalgia syndrome - Plan: CBC with Differential/Platelet, Comprehensive metabolic panel  Depression, major, recurrent, moderate (HCC)  Not currently working due to disabled status  Allergic rhinitis,  unspecified seasonality, unspecified trigger - Plan: montelukast (SINGULAIR) 10 MG tablet, Azelastine HCl 137 MCG/SPRAY SOLN  Hyperlipidemia LDL goal <100 - Plan: Lipid panel, rosuvastatin (CRESTOR) 5 MG tablet  Essential hypertension - Plan: amLODipine (NORVASC) 2.5 MG tablet  OAB (overactive bladder)  Osteopenia, unspecified location - Plan: DG Bone Density  Immunization refused  Constipation, unspecified constipation type  Epigastric pain  Stress incontinence of urine  Coronary vasospasm (HCC) - Plan: CBC with Differential/Platelet, Comprehensive metabolic panel, Lipid panel  Elevated blood sugar - Plan: Hemoglobin A1c  Chronic seasonal allergic rhinitis - Plan: levocetirizine (XYZAL) 5 MG tablet, montelukast (SINGULAIR) 10 MG tablet, Azelastine HCl 137 MCG/SPRAY SOLN  Chest pain, unspecified type - Plan: nitroGLYCERIN (NITROSTAT) 0.4 MG SL tablet    Discussed    Immunization recommendations discussed.  Colonoscopy recommendations reviewed   Medicare Attestation I have personally reviewed: The patient's medical and social history Their use of alcohol, tobacco or illicit drugs Their current medications and supplements The patient's functional ability including ADLs,fall risks, home safety risks, cognitive, and hearing and visual impairment Diet and physical activities Evidence for  depression or mood disorders  The patient's weight, height, and BMI have been recorded in the chart.  I have made referrals, counseling, and provided education to the patient based on review of the above and I have provided the patient with a written personalized care plan for preventive services.     Jill Alexanders, MD   10/12/2021

## 2021-10-13 ENCOUNTER — Telehealth: Payer: Self-pay | Admitting: Family Medicine

## 2021-10-13 LAB — CBC WITH DIFFERENTIAL/PLATELET
Basophils Absolute: 0 10*3/uL (ref 0.0–0.2)
Basos: 1 %
EOS (ABSOLUTE): 0 10*3/uL (ref 0.0–0.4)
Eos: 1 %
Hematocrit: 40 % (ref 34.0–46.6)
Hemoglobin: 13 g/dL (ref 11.1–15.9)
Immature Grans (Abs): 0 10*3/uL (ref 0.0–0.1)
Immature Granulocytes: 0 %
Lymphocytes Absolute: 2 10*3/uL (ref 0.7–3.1)
Lymphs: 48 %
MCH: 25 pg — ABNORMAL LOW (ref 26.6–33.0)
MCHC: 32.5 g/dL (ref 31.5–35.7)
MCV: 77 fL — ABNORMAL LOW (ref 79–97)
Monocytes Absolute: 0.3 10*3/uL (ref 0.1–0.9)
Monocytes: 8 %
Neutrophils Absolute: 1.7 10*3/uL (ref 1.4–7.0)
Neutrophils: 42 %
Platelets: 322 10*3/uL (ref 150–450)
RBC: 5.21 x10E6/uL (ref 3.77–5.28)
RDW: 14.3 % (ref 11.7–15.4)
WBC: 4 10*3/uL (ref 3.4–10.8)

## 2021-10-13 LAB — COMPREHENSIVE METABOLIC PANEL
ALT: 19 IU/L (ref 0–32)
AST: 25 IU/L (ref 0–40)
Albumin/Globulin Ratio: 1.5 (ref 1.2–2.2)
Albumin: 4.6 g/dL (ref 3.8–4.8)
Alkaline Phosphatase: 124 IU/L — ABNORMAL HIGH (ref 44–121)
BUN/Creatinine Ratio: 11 — ABNORMAL LOW (ref 12–28)
BUN: 9 mg/dL (ref 8–27)
Bilirubin Total: 0.5 mg/dL (ref 0.0–1.2)
CO2: 24 mmol/L (ref 20–29)
Calcium: 9.6 mg/dL (ref 8.7–10.3)
Chloride: 102 mmol/L (ref 96–106)
Creatinine, Ser: 0.85 mg/dL (ref 0.57–1.00)
Globulin, Total: 3 g/dL (ref 1.5–4.5)
Glucose: 89 mg/dL (ref 70–99)
Potassium: 4.4 mmol/L (ref 3.5–5.2)
Sodium: 142 mmol/L (ref 134–144)
Total Protein: 7.6 g/dL (ref 6.0–8.5)
eGFR: 78 mL/min/{1.73_m2} (ref >59)

## 2021-10-13 LAB — LIPID PANEL
Chol/HDL Ratio: 3.1 ratio (ref 0.0–4.4)
Cholesterol, Total: 175 mg/dL (ref 100–199)
HDL: 57 mg/dL (ref >39)
LDL Chol Calc (NIH): 105 mg/dL — ABNORMAL HIGH (ref 0–99)
Triglycerides: 67 mg/dL (ref 0–149)
VLDL Cholesterol Cal: 13 mg/dL (ref 5–40)

## 2021-10-13 LAB — HEMOGLOBIN A1C
Est. average glucose Bld gHb Est-mCnc: 126 mg/dL
Hgb A1c MFr Bld: 6 % — ABNORMAL HIGH (ref 4.8–5.6)

## 2021-10-13 MED ORDER — OLOPATADINE HCL 0.1 % OP SOLN: 1.0000 [drp] | Freq: Two times a day (BID) | OPHTHALMIC | 12 refills | Status: DC

## 2021-10-13 NOTE — Telephone Encounter (Signed)
Pt called and state that her insurance will not cover patanol  But they will cover olopatadine hcl 0.1 pt uses  [id number] [place], [place] - [address]

## 2021-10-20 ENCOUNTER — Telehealth: Payer: Self-pay

## 2021-10-20 NOTE — Telephone Encounter (Signed)
Pt. Called because she is still having trouble getting her eye drops from the pharmacy. I called the pharmacy to see what was going on and they said because you can get it OTC they won't cover it and OTC it is around 9.99. per the pt. The one we called in is covered after a PA is completed for it. Per pt. The one [facility]TC [facility] is 25 dollars online not sure what the pharmacists is talking about. She would like you to do a PA for her olopatadine (patanol) 0.1% ophthalmic solution.

## 2021-10-21 ENCOUNTER — Telehealth: Payer: Self-pay

## 2021-10-21 NOTE — Telephone Encounter (Signed)
P.A. OLOPATADINE/PATANOL completed per Cover my meds this is covered medication.  Called pharmacy and they were processing with [place].  Went thru [facility] no charge.  Called pt no answer, voicemail not set up. Sent mychart message

## 2021-10-21 NOTE — Telephone Encounter (Signed)
Pt wanted to have her vitamin d checked and it was not done. Please advise if it can be drawn due to it being high last year. [place]

## 2021-10-25 ENCOUNTER — Other Ambulatory Visit: Payer: Self-pay | Admitting: Family Medicine

## 2021-10-25 DIAGNOSIS — R079 Chest pain, unspecified: Secondary | ICD-10-CM

## 2021-10-26 ENCOUNTER — Other Ambulatory Visit: Payer: Medicare Other

## 2021-10-26 ENCOUNTER — Other Ambulatory Visit: Payer: Self-pay

## 2021-10-26 DIAGNOSIS — E559 Vitamin D deficiency, unspecified: Secondary | ICD-10-CM

## 2021-10-26 NOTE — Telephone Encounter (Signed)
Done KH 

## 2021-10-27 LAB — VITAMIN D 25 HYDROXY (VIT D DEFICIENCY, FRACTURES): Vit D, 25-Hydroxy: 103 ng/mL — ABNORMAL HIGH (ref 30.0–100.0)

## 2021-10-31 DIAGNOSIS — H11423 Conjunctival edema, bilateral: Secondary | ICD-10-CM | POA: Diagnosis not present

## 2021-10-31 DIAGNOSIS — H2513 Age-related nuclear cataract, bilateral: Secondary | ICD-10-CM | POA: Diagnosis not present

## 2021-10-31 DIAGNOSIS — H52221 Regular astigmatism, right eye: Secondary | ICD-10-CM | POA: Diagnosis not present

## 2021-10-31 DIAGNOSIS — H35033 Hypertensive retinopathy, bilateral: Secondary | ICD-10-CM | POA: Diagnosis not present

## 2021-10-31 DIAGNOSIS — H5201 Hypermetropia, right eye: Secondary | ICD-10-CM | POA: Diagnosis not present

## 2021-10-31 DIAGNOSIS — H524 Presbyopia: Secondary | ICD-10-CM | POA: Diagnosis not present

## 2021-11-02 ENCOUNTER — Other Ambulatory Visit: Payer: Self-pay | Admitting: Family Medicine

## 2021-11-02 DIAGNOSIS — R079 Chest pain, unspecified: Secondary | ICD-10-CM

## 2021-11-09 DIAGNOSIS — Z760 Encounter for issue of repeat prescription: Secondary | ICD-10-CM | POA: Diagnosis not present

## 2021-11-09 DIAGNOSIS — G8929 Other chronic pain: Secondary | ICD-10-CM | POA: Diagnosis not present

## 2021-11-09 DIAGNOSIS — M797 Fibromyalgia: Secondary | ICD-10-CM | POA: Diagnosis not present

## 2021-11-19 ENCOUNTER — Other Ambulatory Visit: Payer: Self-pay | Admitting: Family Medicine

## 2021-12-06 DIAGNOSIS — M797 Fibromyalgia: Secondary | ICD-10-CM | POA: Diagnosis not present

## 2021-12-16 ENCOUNTER — Other Ambulatory Visit: Payer: Self-pay | Admitting: Family Medicine

## 2021-12-16 DIAGNOSIS — J309 Allergic rhinitis, unspecified: Secondary | ICD-10-CM

## 2021-12-16 DIAGNOSIS — J302 Other seasonal allergic rhinitis: Secondary | ICD-10-CM

## 2022-01-03 DIAGNOSIS — M797 Fibromyalgia: Secondary | ICD-10-CM | POA: Diagnosis not present

## 2022-01-03 DIAGNOSIS — L0292 Furuncle, unspecified: Secondary | ICD-10-CM | POA: Diagnosis not present

## 2022-01-19 DIAGNOSIS — R350 Frequency of micturition: Secondary | ICD-10-CM | POA: Diagnosis not present

## 2022-01-19 DIAGNOSIS — G894 Chronic pain syndrome: Secondary | ICD-10-CM | POA: Diagnosis not present

## 2022-01-25 ENCOUNTER — Encounter: Payer: Self-pay | Admitting: Internal Medicine

## 2022-01-31 ENCOUNTER — Telehealth: Payer: Self-pay

## 2022-01-31 NOTE — Telephone Encounter (Signed)
   Pre-operative Risk Assessment    Patient Name: Gina Espinoza  DOB: 27-Jan-1961 MRN: [id number]      Request for Surgical Clearance    Procedure:  partial trachelectomy, uterosacral suspension to the extraperitoneal uterosacral ligaments, with concomitant anterior repair.  Date of Surgery:  Clearance 04/17/22                                 Surgeon:  [place] Pink Surgeon's Group or Practice Name:  [facility] Phone number:  [phone] Fax number:  [phone]   Type of Clearance Requested:   - Medical    Type of Anesthesia:  Not Indicated   Additional requests/questions:  Please advise surgeon/provider what medications should be held.  Signed, Elsie Lincoln Adair Lemar   01/31/2022, 1:36 PM

## 2022-02-02 NOTE — Telephone Encounter (Signed)
Attempted to reach the patient but no voicemail box set up.

## 2022-02-02 NOTE — Telephone Encounter (Signed)
   Name: Gina Espinoza  DOB: July 03, 1960  MRN: [id number]  Primary Cardiologist: Dr. Martinique  Chart reviewed as part of pre-operative protocol coverage. Because of [name]'s past medical history and time since last visit, she will require a follow-up in-office visit in order to better assess preoperative cardiovascular risk.  Last OV was in 10/2020 so >1 year ago.  Pre-op covering staff: - Please schedule appointment and call patient to inform them. If patient already had an upcoming appointment within acceptable timeframe, please add "pre-op clearance" to the appointment notes so provider is aware. - Please contact requesting surgeon's office via preferred method (i.e, phone, fax) to inform them of need for appointment prior to surgery.  No anticoags/platelets listed to hold but need to review at upcoming OV given time since last visit and extensive med list.  Charlie Pitter, PA-C  02/02/2022, 12:24 PM

## 2022-02-03 NOTE — Telephone Encounter (Signed)
Pt scheduled for 03/14/22 @ 8 am with Dr. Martinique for yrly f/u and pre op clearance. Pt only wanted to see DR. Martinique.

## 2022-02-10 ENCOUNTER — Other Ambulatory Visit: Payer: Self-pay | Admitting: Family Medicine

## 2022-02-10 DIAGNOSIS — J309 Allergic rhinitis, unspecified: Secondary | ICD-10-CM

## 2022-02-10 DIAGNOSIS — J302 Other seasonal allergic rhinitis: Secondary | ICD-10-CM

## 2022-02-14 DIAGNOSIS — Z1231 Encounter for screening mammogram for malignant neoplasm of breast: Secondary | ICD-10-CM | POA: Diagnosis not present

## 2022-02-28 ENCOUNTER — Encounter: Payer: Self-pay | Admitting: Internal Medicine

## 2022-02-28 ENCOUNTER — Encounter (INDEPENDENT_AMBULATORY_CARE_PROVIDER_SITE_OTHER): Payer: Self-pay

## 2022-03-09 NOTE — Progress Notes (Signed)
Cardiology Office Note   Date:  03/14/2022   ID:  Ronnetta, Currington February 09, 1961, MRN [id number]  PCP:  Denita Lung, MD  Cardiologist:   Britni Driscoll [place], MD   Chief Complaint  Patient presents with   Coronary Artery Disease      History of Present Illness: Gina Espinoza is a 61 y.o. female who is seen for follow up vasospastic CAD.  She has a history of HLD and HTN. She has a long history of coronary vasospasm. Was seen by Dr Verl Blalock in the past. In 2006 had an abnormal stress test with inferolateral ST depression. This led to a cardiac cath showing normal coronary arteries. She was treated for coronary spasm with amlodipine and sl Ntg. Seen in 2014 and symptoms were stable. Seen last year with some increase in symptoms.   Last year we performed a Myoview study showing normal perfusion and EF 46%. By Echo EF was normal.   She is now needing surgery for bladder suspension- planning to have this done after Thanksgiving by Dr [place] Pink. Needs pre op clearance.  Since last year she has experienced no chest pain. No dyspnea. Feels very well and is active.   Past Medical History:  Diagnosis Date   Anxiety    Chest pain, unspecified    Cough    Depression    Fibromyalgia    Headache(784.0)    Other and unspecified hyperlipidemia    mixed   Personal history of unspecified circulatory disease    Unspecified essential hypertension     Past Surgical History:  Procedure Laterality Date   anterior perineoplasty     posterior repair. 05/16/02   BLADDER SUSPENSION     CARDIAC CATHETERIZATION  2006   normal coronaries   ESOPHAGOGASTRODUODENOSCOPY N/A 02/09/2013   Procedure: ESOPHAGOGASTRODUODENOSCOPY (EGD);  Surgeon: Milus Banister, MD;  Location: [facility];  Service: Endoscopy;  Laterality: N/A;   excision sebaceous cyst  11/2013   posterior scalp   HEMATOMA EVACUATION     vaginal hematoma 05/23/02   shoulder surgery     sebacceous cyst removal   UTERINE SUSPENSION        Current Outpatient Medications  Medication Sig Dispense Refill   ALPRAZolam (XANAX) 1 MG tablet Take 1 mg by mouth every 4 (four) hours as needed.  3   alprazolam (XANAX) 2 MG tablet Take 2 mg by mouth every 4 (four) hours as needed for sleep.     amLODipine (NORVASC) 2.5 MG tablet TAKE 1 TABLET BY MOUTH EVERY DAY 90 tablet 3   Azelastine HCl 137 MCG/SPRAY SOLN PLACE 2 SPRAYS INTO BOTH NOSTRILS 2 (TWO) TIMES DAILY. USE IN EACH NOSTRIL AS DIRECTED 30 mL 0   bethanechol (URECHOLINE) 10 MG tablet Take 10 mg by mouth 3 (three) times daily as needed (bladder spasms).     budesonide (RHINOCORT AQUA) 32 MCG/ACT nasal spray Place 2 sprays into both nostrils daily. (Patient taking differently: Place 2 sprays into both nostrils as needed.) 3 Bottle 3   clindamycin (CLINDAGEL) 1 % gel Apply 1 application topically as needed.     diazepam (VALIUM) 5 MG tablet Take 5 mg by mouth every 6 (six) hours as needed for anxiety (1-2 tabs at a time).     diclofenac sodium (VOLTAREN) 1 % GEL 3 grams to 3 large joints up to 3 times daily 3 Tube 3   erythromycin ophthalmic ointment 1 application as needed. (Patient not taking: Reported on 10/12/2021)  escitalopram (LEXAPRO) 20 MG tablet Take 20 mg by mouth as needed. (Patient not taking: Reported on 10/12/2021)     fentaNYL (DURAGESIC - DOSED MCG/HR) 50 MCG/HR Place 50 mcg onto the skin as needed.      gabapentin (NEURONTIN) 300 MG capsule Take 300 mg by mouth See admin instructions. Take 1 tablet in the morning and 1 tablet at night  3   HYDROcodone-acetaminophen (NORCO) 10-325 MG tablet Take 1 tablet by mouth every 6 (six) hours as needed.     HYDROmorphone (DILAUDID) 2 MG tablet Take 1 tablet (2 mg total) by mouth every 4 (four) hours as needed for pain. 10 tablet 0   hydrOXYzine (ATARAX) 50 MG tablet Take 50 mg by mouth daily as needed.     isosorbide mononitrate (IMDUR) 30 MG 24 hr tablet TAKE 1 TABLET BY MOUTH EVERY DAY 90 tablet 3   levocetirizine  (XYZAL) 5 MG tablet Take 1 tablet (5 mg total) by mouth every evening. 90 tablet 3   lidocaine (XYLOCAINE) 5 % ointment Apply 1 application topically as needed.     linaclotide (LINZESS) 290 MCG CAPS capsule Take 1 capsule (290 mcg total) by mouth daily before breakfast. 30 capsule 6   LINZESS 145 MCG CAPS capsule TAKE 1 CAPSULE BY MOUTH DAILY BEFORE BREAKFAST. 30 capsule 6   metaxalone (SKELAXIN) 800 MG tablet Take 800 mg by mouth 2 (two) times daily as needed (muscle spasms).      methocarbamol (ROBAXIN) 500 MG tablet Take 500 mg by mouth every 8 (eight) hours as needed for muscle spasms.     mirabegron ER (MYRBETRIQ) 25 MG TB24 tablet Take 25 mg by mouth as needed.      montelukast (SINGULAIR) 10 MG tablet Take 1 tablet (10 mg total) by mouth at bedtime. 90 tablet 3   morphine (MS CONTIN) 15 MG 12 hr tablet Take 1 tablet (15 mg total) by mouth 2 (two) times daily as needed for pain. 10 tablet 0   nitroGLYCERIN (NITROSTAT) 0.4 MG SL tablet PLACE 1 TABLET UNDER THE TONGUE EVERY 5 MINUTES AS NEEDED FOR CHEST PAIN. 75 tablet 0   olopatadine (PATANOL) 0.1 % ophthalmic solution Place 1 drop into both eyes 2 (two) times daily. 5 mL 12   Oxycodone HCl 10 MG TABS Take 10 mg by mouth as needed.     pantoprazole (PROTONIX) 40 MG tablet TAKE 1 TABLET BY MOUTH EVERY DAY AT 6PM 90 tablet 1   polyethylene glycol (MIRALAX / GLYCOLAX) packet Take 17 g by mouth 2 (two) times daily. (Patient taking differently: Take 17 g by mouth daily.) 14 each 0   promethazine (PHENERGAN) 25 MG tablet Take 25 mg by mouth every 6 (six) hours as needed for nausea or vomiting.     rosuvastatin (CRESTOR) 5 MG tablet TAKE 1 TABLET BY MOUTH EVERYDAY AT BEDTIME 90 tablet 3   Sod Fluoride-Potassium Nitrate 1.1-5 % PSTE sodium fluoride 1.1 %-potassium nitrate 5 % dental paste  PLEASE SEE ATTACHED FOR DETAILED DIRECTIONS     spironolactone (ALDACTONE) 100 MG tablet spironolactone 100 mg tablet     Suvorexant (BELSOMRA) 20 MG TABS  Belsomra 20 mg tablet  TAKE 1 TABLET BY MOUTH EVERY DAY     tamsulosin (FLOMAX) 0.4 MG CAPS capsule Take 0.4 mg by mouth daily.  3   tiZANidine (ZANAFLEX) 2 MG tablet Take 2 mg by mouth as needed.  0   topiramate (TOPAMAX) 25 MG tablet topiramate 25 mg tablet  traMADol (ULTRAM) 50 MG tablet Take 50 mg by mouth every 6 (six) hours.     triamterene-hydrochlorothiazide (MAXZIDE) 75-50 MG tablet Take 1 tablet by mouth as needed. 90 tablet 3   valACYclovir (VALTREX) 500 MG tablet Take 500 mg by mouth daily as needed (infection).     Vitamin D, Ergocalciferol, (DRISDOL) 1.25 MG (50000 UNIT) CAPS capsule Take 1 capsule by mouth once a week.     zolpidem (AMBIEN) 10 MG tablet Take 1 tablet (10 mg total) by mouth every other day. Alternates with temazepam 10 tablet 0   No current facility-administered medications for this visit.    Allergies:   Savella [milnacipran hcl], Amitiza [lubiprostone], Bee venom, Codeine, and Ivp dye [iodinated contrast media]    Social History:  The patient  reports that she has never smoked. She has never used smokeless tobacco. She reports that she does not drink alcohol and does not use drugs.   Family History:  The patient's family history includes Allergies in her mother; Asthma in her daughter and grandchild; Breast cancer in her maternal grandmother; Colon cancer in her brother; Heart disease in her father.    ROS:  Please see the history of present illness.   Otherwise, review of systems are positive for none.   All other systems are reviewed and negative.    PHYSICAL EXAM: VS:  BP 126/78   Pulse (!) 50   Ht '5\' 6"'$  (1.676 m)   Wt 165 lb 6.4 oz (75 kg)   LMP  (LMP Unknown)   SpO2 100%   BMI 26.70 kg/m  , BMI Body mass index is 26.7 kg/m. GEN: Well nourished, well developed, in no acute distress  HEENT: normal  Neck: no JVD, carotid bruits, or masses Cardiac: RRR; no murmurs, rubs, or gallops,no edema  Respiratory:  clear to auscultation bilaterally,  normal work of breathing GI: soft, nontender, nondistended, + BS MS: no deformity or atrophy  Skin: warm and dry, no rash Neuro:  Strength and sensation are intact Psych: euthymic mood, full affect   EKG:  EKG is ordered today. The ekg ordered today demonstrates Sinus brady with rate 50. Normal. I have personally reviewed and interpreted this study.    Recent Labs: 10/12/2021: ALT 19; BUN 9; Creatinine, Ser 0.85; Hemoglobin 13.0; Platelets 322; Potassium 4.4; Sodium 142    Lipid Panel    Component Value Date/Time   CHOL 175 10/12/2021 1500   TRIG 67 10/12/2021 1500   HDL 57 10/12/2021 1500   CHOLHDL 3.1 10/12/2021 1500   CHOLHDL 3.0 08/25/2016 0932   VLDL 18 08/25/2016 0932   LDLCALC 105 (H) 10/12/2021 1500   LDLDIRECT 161.1 12/17/2008 0901      Wt Readings from Last 3 Encounters:  03/14/22 165 lb 6.4 oz (75 kg)  10/12/21 165 lb (74.8 kg)  11/18/20 167 lb (75.8 kg)      Other studies Reviewed: Additional studies/ records that were reviewed today include: remote cardiac cath report 2006.Marland Kitchen Review of the above records demonstrates: normal coronary arteries  Myoview 11/18/20: Study Highlights    Nuclear stress EF: 46%. The left ventricular ejection fraction is mildly decreased (45-54%). T wave inversion was noted during stress in the II, III, aVF, V4, V5, V6, V3 and V2 leads (more prominent than baseline TWI) that resolved in the post-infusion period. May represent episode of vasospasm given patient's history. Normal myocardial perfusion with no evidence of ischemia or infarction. This is an intermediate risk study due to mildly reduced LVEF.  Recommend TTE for further evaluation.   Gwyndolyn Kaufman, MD   Echo 12/15/20: IMPRESSIONS     1. Left ventricular ejection fraction, by estimation, is 60 to 65%. Left  ventricular ejection fraction by 3D volume is 59 %. The left ventricle has  normal function. The left ventricle has no regional wall motion  abnormalities.  Left ventricular diastolic   parameters were normal. The average left ventricular global longitudinal  strain is -22.7 %. The global longitudinal strain is normal.   2. Right ventricular systolic function is normal. The right ventricular  size is normal. There is normal pulmonary artery systolic pressure.   3. The mitral valve is normal in structure. No evidence of mitral valve  regurgitation. No evidence of mitral stenosis.   4. The aortic valve is tricuspid. Aortic valve regurgitation is not  visualized.   5. The inferior vena cava is normal in size with greater than 50%  respiratory variability, suggesting right atrial pressure of 3 mmHg.   Conclusion(s)/Recommendation(s): Otherwise normal echocardiogram, with  minor abnormalities described in the report.   ASSESSMENT AND PLAN:  1.  Chest pain. History of coronary vasospasm similar to symptoms in 2006. It is nitrate responsive. She is on maintenance amlodipine and isosorbide.  Myoview last year showed normal perfusion. She is asymptomatic. She is cleared for planned urologic surgery from my standpoint.  2. HLD. On crestor.  3. HTN controlled.   Current medicines are reviewed at length with the patient today.  The patient does not have concerns regarding medicines.  The following changes have been made:  no change  Labs/ tests ordered today include:      Disposition:   FU with me one year  Signed, Sharan Mcenaney Martinique, MD  03/14/2022 8:22 AM    [facility] [address], [place], [place], [address] Phone [phone], Fax [phone]

## 2022-03-13 ENCOUNTER — Encounter: Payer: Self-pay | Admitting: Internal Medicine

## 2022-03-14 ENCOUNTER — Ambulatory Visit: Payer: Medicare Other | Attending: Cardiology | Admitting: Cardiology

## 2022-03-14 ENCOUNTER — Encounter: Payer: Self-pay | Admitting: Cardiology

## 2022-03-14 VITALS — BP 126/78 | HR 50 | Ht 66.0 in | Wt 165.4 lb

## 2022-03-14 DIAGNOSIS — M797 Fibromyalgia: Secondary | ICD-10-CM | POA: Diagnosis not present

## 2022-03-14 DIAGNOSIS — E559 Vitamin D deficiency, unspecified: Secondary | ICD-10-CM | POA: Diagnosis not present

## 2022-03-14 DIAGNOSIS — I201 Angina pectoris with documented spasm: Secondary | ICD-10-CM

## 2022-03-14 DIAGNOSIS — E7800 Pure hypercholesterolemia, unspecified: Secondary | ICD-10-CM | POA: Diagnosis not present

## 2022-03-14 DIAGNOSIS — I10 Essential (primary) hypertension: Secondary | ICD-10-CM

## 2022-03-14 DIAGNOSIS — R895 Abnormal microbiological findings in specimens from other organs, systems and tissues: Secondary | ICD-10-CM | POA: Diagnosis not present

## 2022-03-14 DIAGNOSIS — Z760 Encounter for issue of repeat prescription: Secondary | ICD-10-CM | POA: Diagnosis not present

## 2022-03-14 DIAGNOSIS — G8929 Other chronic pain: Secondary | ICD-10-CM | POA: Diagnosis not present

## 2022-04-06 DIAGNOSIS — Z01818 Encounter for other preprocedural examination: Secondary | ICD-10-CM | POA: Diagnosis not present

## 2022-04-06 DIAGNOSIS — G894 Chronic pain syndrome: Secondary | ICD-10-CM | POA: Diagnosis not present

## 2022-04-06 DIAGNOSIS — R001 Bradycardia, unspecified: Secondary | ICD-10-CM | POA: Diagnosis not present

## 2022-04-11 DIAGNOSIS — N390 Urinary tract infection, site not specified: Secondary | ICD-10-CM | POA: Diagnosis not present

## 2022-04-11 DIAGNOSIS — R001 Bradycardia, unspecified: Secondary | ICD-10-CM | POA: Diagnosis not present

## 2022-04-11 DIAGNOSIS — G8929 Other chronic pain: Secondary | ICD-10-CM | POA: Diagnosis not present

## 2022-04-11 DIAGNOSIS — M797 Fibromyalgia: Secondary | ICD-10-CM | POA: Diagnosis not present

## 2022-04-17 DIAGNOSIS — Z885 Allergy status to narcotic agent status: Secondary | ICD-10-CM | POA: Diagnosis not present

## 2022-04-17 DIAGNOSIS — K589 Irritable bowel syndrome without diarrhea: Secondary | ICD-10-CM | POA: Diagnosis not present

## 2022-04-17 DIAGNOSIS — Z888 Allergy status to other drugs, medicaments and biological substances status: Secondary | ICD-10-CM | POA: Diagnosis not present

## 2022-04-17 DIAGNOSIS — Z79899 Other long term (current) drug therapy: Secondary | ICD-10-CM | POA: Diagnosis not present

## 2022-04-17 DIAGNOSIS — Z91041 Radiographic dye allergy status: Secondary | ICD-10-CM | POA: Diagnosis not present

## 2022-04-17 DIAGNOSIS — M797 Fibromyalgia: Secondary | ICD-10-CM | POA: Diagnosis not present

## 2022-04-17 DIAGNOSIS — K219 Gastro-esophageal reflux disease without esophagitis: Secondary | ICD-10-CM | POA: Diagnosis not present

## 2022-04-17 DIAGNOSIS — I10 Essential (primary) hypertension: Secondary | ICD-10-CM | POA: Diagnosis not present

## 2022-04-17 DIAGNOSIS — E785 Hyperlipidemia, unspecified: Secondary | ICD-10-CM | POA: Diagnosis not present

## 2022-04-17 DIAGNOSIS — G894 Chronic pain syndrome: Secondary | ICD-10-CM | POA: Diagnosis not present

## 2022-04-17 DIAGNOSIS — G8929 Other chronic pain: Secondary | ICD-10-CM | POA: Diagnosis not present

## 2022-04-20 ENCOUNTER — Telehealth: Payer: Self-pay

## 2022-04-20 NOTE — Patient Outreach (Addendum)
  Care Coordination TOC Note Transition Care Management Follow-up Telephone Call Date of discharge and from where: 04/19/22-[facility]  Dx; "cervical hypertropic elongation s/p surgery" How have you been since you were released from the hospital? Patient reports she is having some nausea. She has not vomited. She has anti-emetic med in home and encouraged to use as prescribed. She ate a small meal a short while ago. She is having pain which she knows is to be expected but pain managed with current regimen. Discussed bowel regimen given patient taking pain meds. She reports no BM in past few days. She has hx of constipation issues. She did take Linzess and Miralax today. Offered alternative/non-pharmacological measures to aide in producing BM. Also, instructed patient to call MD to see what is safe for her to take. She voiced understanding and will call office.  Any questions or concerns? No  Items Reviewed: Did the pt receive and understand the discharge instructions provided? Yes  Medications obtained and verified?  Patient reported she knew her meds and did not need to review them Other? Yes  Any new allergies since your discharge? No  Dietary orders reviewed? Yes Do you have support at home? Yes   Home Care and Equipment/Supplies: Were home health services ordered? not applicable If so, what is the name of the agency? N/A  Has the agency set up a time to come to the patient's home? not applicable Were any new equipment or medical supplies ordered?  No What is the name of the medical supply agency? N/A Were you able to get the supplies/equipment? not applicable Do you have any questions related to the use of the equipment or supplies? No  Functional Questionnaire: (I = Independent and D = Dependent) ADLs: I  Bathing/Dressing- I  Meal Prep- I  Eating- I  Maintaining continence- I  Transferring/Ambulation- I  Managing Meds- I  Follow up appointments reviewed:  PCP Hospital f/u  appt confirmed? No  Discussed with patient importance and recommendation to follow up with PCP following an inpatient admission. She will call office to alert them of recent hospitalization and make an appt. [facility] f/u appt confirmed? Yes  Scheduled to see surgeon on 05/26/22 @ 10:15am. Are transportation arrangements needed? No  If their condition worsens, is the pt aware to call PCP or go to the Emergency Dept.? Yes Was the patient provided with contact information for the PCP's office or ED? Yes Was to pt encouraged to call back with questions or concerns? Yes  SDOH assessments and interventions completed:   Yes SDOH Interventions Today    Flowsheet Row Most Recent Value  SDOH Interventions   Food Insecurity Interventions Intervention Not Indicated  Transportation Interventions Intervention Not Indicated       Care Coordination Interventions:  Education provided    Encounter Outcome:  Pt. Visit Completed    Enzo Montgomery, RN,BSN,CCM [facility] Telephonic Care Management Coordinator Direct Phone: [phone] Toll Free: [phone] Fax: [phone]

## 2022-04-25 ENCOUNTER — Other Ambulatory Visit: Payer: Self-pay | Admitting: Medical

## 2022-04-25 DIAGNOSIS — J309 Allergic rhinitis, unspecified: Secondary | ICD-10-CM

## 2022-04-25 DIAGNOSIS — J302 Other seasonal allergic rhinitis: Secondary | ICD-10-CM

## 2022-04-27 ENCOUNTER — Other Ambulatory Visit: Payer: Medicare Other

## 2022-05-04 ENCOUNTER — Ambulatory Visit (INDEPENDENT_AMBULATORY_CARE_PROVIDER_SITE_OTHER): Payer: Medicare Other | Admitting: Family Medicine

## 2022-05-04 ENCOUNTER — Encounter: Payer: Self-pay | Admitting: Family Medicine

## 2022-05-04 VITALS — BP 130/80 | HR 56 | Wt 164.0 lb

## 2022-05-04 DIAGNOSIS — L608 Other nail disorders: Secondary | ICD-10-CM

## 2022-05-04 DIAGNOSIS — J40 Bronchitis, not specified as acute or chronic: Secondary | ICD-10-CM

## 2022-05-04 DIAGNOSIS — L02419 Cutaneous abscess of limb, unspecified: Secondary | ICD-10-CM

## 2022-05-04 MED ORDER — AZITHROMYCIN 500 MG PO TABS: 500.0000 mg | ORAL_TABLET | Freq: Every day | ORAL | 0 refills | Status: DC

## 2022-05-04 NOTE — Patient Instructions (Signed)
Use heat to the area several times per day.  Use an antibiotic ointment on that to keep that area open

## 2022-05-04 NOTE — Progress Notes (Signed)
   Subjective:    Patient ID: Gina Espinoza, female    DOB: 02-Jan-1961, 61 y.o.   MRN: [id number]  HPI She is here for multiple concerns.  She has a lesion on her right axilla that has been draining for the last several days.  Has had previous surgery on that area.  She also complains of discoloration of her nails over the last several months.  She had GYN surgery on November 27 and postoperatively she developed a cough.  The cough is still present but no fever, chills, sore throat or earache.  Coughing does make her surgical incision more sore.   Review of Systems     Objective:   Physical Exam Alert and in no distress. Tympanic membranes and canals are normal. Pharyngeal area is normal. Neck is supple without adenopathy or thyromegaly. Cardiac exam shows a regular sinus rhythm without murmurs or gallops. Lungs are clear to auscultation. Exam of the right axilla does show a small healing lesion with minimal surrounding edema.  Exam of all of her nail does show some discoloration approximately in a consistent pattern on all nails.       Assessment & Plan:  Bronchitis - Plan: azithromycin (ZITHROMAX) 500 MG tablet  Change in nail appearance - Plan: Ambulatory referral to Dermatology  Axillary abscess I explained that the antibiotic will work much longer than the 3 days that she is on it if she still having difficulty in a week she should call me. Then discussed the nailbed stating that something changed several months ago causing the discoloration in the nail will refer to Derm to get their opinion. Continue to treat the abscess with irrigation.

## 2022-05-05 ENCOUNTER — Ambulatory Visit: Payer: Medicare Other | Admitting: Family Medicine

## 2022-05-08 ENCOUNTER — Telehealth: Payer: Self-pay | Admitting: Family Medicine

## 2022-05-08 NOTE — Telephone Encounter (Signed)
Gina Espinoza stated you wanted her to follow up after previous visit.She has finished her prescription and her arm is better but her cough has not gone away. She is wondering if she needs another medication for the cough.

## 2022-05-09 NOTE — Telephone Encounter (Signed)
Tried calling patient. No answer. Patient's "voice mailbox not set up yet". Unable to leave message. Will try calling later.

## 2022-05-12 ENCOUNTER — Other Ambulatory Visit: Payer: Self-pay | Admitting: Family Medicine

## 2022-05-12 NOTE — Telephone Encounter (Signed)
Alternative requested last apt 05/04/22 next apt 10/24/22.

## 2022-05-13 ENCOUNTER — Other Ambulatory Visit: Payer: Self-pay | Admitting: Family Medicine

## 2022-05-13 DIAGNOSIS — E785 Hyperlipidemia, unspecified: Secondary | ICD-10-CM

## 2022-05-17 DIAGNOSIS — Z760 Encounter for issue of repeat prescription: Secondary | ICD-10-CM | POA: Diagnosis not present

## 2022-05-17 DIAGNOSIS — E559 Vitamin D deficiency, unspecified: Secondary | ICD-10-CM | POA: Diagnosis not present

## 2022-05-17 DIAGNOSIS — M797 Fibromyalgia: Secondary | ICD-10-CM | POA: Diagnosis not present

## 2022-05-25 DIAGNOSIS — R350 Frequency of micturition: Secondary | ICD-10-CM | POA: Diagnosis not present

## 2022-07-04 ENCOUNTER — Other Ambulatory Visit: Payer: Self-pay | Admitting: Family Medicine

## 2022-07-04 DIAGNOSIS — J309 Allergic rhinitis, unspecified: Secondary | ICD-10-CM

## 2022-07-04 DIAGNOSIS — J302 Other seasonal allergic rhinitis: Secondary | ICD-10-CM

## 2022-07-28 ENCOUNTER — Other Ambulatory Visit: Payer: Self-pay | Admitting: Family Medicine

## 2022-07-28 DIAGNOSIS — J302 Other seasonal allergic rhinitis: Secondary | ICD-10-CM

## 2022-07-28 DIAGNOSIS — J309 Allergic rhinitis, unspecified: Secondary | ICD-10-CM

## 2022-07-31 DIAGNOSIS — M545 Low back pain, unspecified: Secondary | ICD-10-CM | POA: Diagnosis not present

## 2022-07-31 DIAGNOSIS — G8929 Other chronic pain: Secondary | ICD-10-CM | POA: Diagnosis not present

## 2022-07-31 DIAGNOSIS — E559 Vitamin D deficiency, unspecified: Secondary | ICD-10-CM | POA: Diagnosis not present

## 2022-08-25 ENCOUNTER — Other Ambulatory Visit: Payer: Self-pay | Admitting: Medical

## 2022-08-25 DIAGNOSIS — J302 Other seasonal allergic rhinitis: Secondary | ICD-10-CM

## 2022-08-25 DIAGNOSIS — J309 Allergic rhinitis, unspecified: Secondary | ICD-10-CM

## 2022-08-25 NOTE — Telephone Encounter (Signed)
Is this okay to refill? 

## 2022-09-29 ENCOUNTER — Ambulatory Visit (INDEPENDENT_AMBULATORY_CARE_PROVIDER_SITE_OTHER): Payer: 59 | Admitting: Medical

## 2022-09-29 ENCOUNTER — Telehealth: Payer: Self-pay | Admitting: Gastroenterology

## 2022-09-29 ENCOUNTER — Telehealth: Payer: Self-pay | Admitting: Internal Medicine

## 2022-09-29 VITALS — BP 108/60 | HR 55 | Temp 97.7°F | Wt 162.8 lb

## 2022-09-29 DIAGNOSIS — F331 Major depressive disorder, recurrent, moderate: Secondary | ICD-10-CM

## 2022-09-29 DIAGNOSIS — M797 Fibromyalgia: Secondary | ICD-10-CM

## 2022-09-29 DIAGNOSIS — M25512 Pain in left shoulder: Secondary | ICD-10-CM

## 2022-09-29 DIAGNOSIS — R110 Nausea: Secondary | ICD-10-CM | POA: Diagnosis not present

## 2022-09-29 DIAGNOSIS — I10 Essential (primary) hypertension: Secondary | ICD-10-CM

## 2022-09-29 DIAGNOSIS — M8580 Other specified disorders of bone density and structure, unspecified site: Secondary | ICD-10-CM

## 2022-09-29 DIAGNOSIS — E785 Hyperlipidemia, unspecified: Secondary | ICD-10-CM | POA: Diagnosis not present

## 2022-09-29 DIAGNOSIS — M25511 Pain in right shoulder: Secondary | ICD-10-CM

## 2022-09-29 DIAGNOSIS — M542 Cervicalgia: Secondary | ICD-10-CM | POA: Diagnosis not present

## 2022-09-29 DIAGNOSIS — M549 Dorsalgia, unspecified: Secondary | ICD-10-CM | POA: Diagnosis not present

## 2022-09-29 DIAGNOSIS — G894 Chronic pain syndrome: Secondary | ICD-10-CM | POA: Diagnosis not present

## 2022-09-29 DIAGNOSIS — L299 Pruritus, unspecified: Secondary | ICD-10-CM | POA: Diagnosis not present

## 2022-09-29 DIAGNOSIS — G8929 Other chronic pain: Secondary | ICD-10-CM

## 2022-09-29 LAB — CBC

## 2022-09-29 MED ORDER — ONDANSETRON HCL 4 MG PO TABS: 4.0000 mg | ORAL_TABLET | Freq: Three times a day (TID) | ORAL | 1 refills | Status: DC | PRN

## 2022-09-29 MED ORDER — GABAPENTIN 300 MG PO CAPS: 300.0000 mg | ORAL_CAPSULE | Freq: Two times a day (BID) | ORAL | 0 refills | Status: DC

## 2022-09-29 MED ORDER — HYDROCODONE-ACETAMINOPHEN 10-325 MG PO TABS: 1.0000 | ORAL_TABLET | Freq: Two times a day (BID) | ORAL | 0 refills | Status: DC | PRN

## 2022-09-29 MED ORDER — TIZANIDINE HCL 2 MG PO TABS: 2.0000 mg | ORAL_TABLET | Freq: Two times a day (BID) | ORAL | 0 refills | Status: DC | PRN

## 2022-09-29 NOTE — Telephone Encounter (Signed)
Pt needs nausea medication to be sent in as she has to take it 30 mins before hydrocodone or she will get sick. Please send in

## 2022-09-29 NOTE — Telephone Encounter (Signed)
The pt has been advised that we confirmed recall colon is 2029. She has been advised that Dr Christella Hartigan is not practicing at this time. She was directed to the web site to choose who she would like to see going forward.  The pt has been advised of the information and verbalized understanding.

## 2022-09-29 NOTE — Patient Instructions (Signed)
Chronic pain, neck pain, back pain, fibromyalgia, spasm of muscles in the neck and back Begin back on gabapentin 300 mg once daily for 5 days, then increase to twice daily to help with your chronic pain Begin back on muscle relaxer tizanidine twice daily through the weekend to help with spasm and tightness of the muscles I will place referral to pain management to help with your pain issues I recommend you seek massage therapy to help with some of these issues as well.  There are several local massage therapist.  Check online on Google as I do not have a specific massage therapist.  This is usually out-of-pocket as insurance often does not pay for massage therapy Try to stretch every day Consider water aerobics at the [facility] I wrote a few hydrocodone short-term to help with some acute breakthrough pain We do not prescribe chronic narcotic pain medicine here.  Since you are on prior medicines like fentanyl and oxycodone, you will need to talk to a pain management doctor about this There are also a lot of overlapping medicines in your chart record such as 3 different muscle laxer's, 2 different doses of Xanax, multiple chronic narcotic medications.  This is not safe. I recommend you return soon to see Dr. Susann Givens your primary care provider and bring your pill bottles with you so we can help sort this out

## 2022-09-29 NOTE — Telephone Encounter (Signed)
Received phone call from patient.  She was under the assumption her colonoscopy was due this year because she thought Dr. Christella Hartigan had her on every 5 years.  However, when I told her it was 2029 she just wanted to double-check and make sure.  Please call patient and advise.  Thank you.

## 2022-09-29 NOTE — Progress Notes (Signed)
Subjective:  Gina Espinoza is a 62 y.o. female who presents for Chief Complaint  Patient presents with   pain    Neck, shoulder and lower back pain, going on since the beginning of the year and getting worse. Back itching and feels like someone is prinking her constantly. Arm pain that will go into neck. It gets worse with sitting but movement now is getting worse too     Here for neck shoulder and low back pain, ongoing this year.   She normally sees Dr. Susann Givens here for primary care.  Medical team: Cardiology  Dr. Shirley Muscat, [facility]  No current pain management, no spine specialist  She notes a history of chronic pain and fibromyalgia.  She has long-term history of neck pain, back pain, fibromyalgia.  But in recent weeks she is having new or worsening pains.  Gets burning in shoulders in general from fibromyalgia ongoing but worse burning in recent weeks.  She is getting pain between shoulder blades and in her neck, gets pain and itching.  Pain in this area is severe at times of late.  Sometimes gets pain and tingling in feet, low back pain ongoing.   So between fibromyalgia and pains, can't function well of late.  Even washing dishes hurts to stand at this point.    She has hx/o osteopenia.    Was seeing gynecology who was also prescribing her pain medication regimen.  She was managing her pain medications but has been out of this since December.   Hurting a lot and wants some help with her pain medication.     Has seen orthopedics, but not recently.  She notes that in the past that she has declined pain management and physical therapy.  She feels like physical therapy would just make things worse.  No prior massage therapy.  She has seen a chiropractor in the past.  She notes hx/o cyst in back of head removed, but no prior cervical or lumbar spine surgery.   No prior EDSI injections, and states that she has no interest in steroid injections or  surgery.  When reviewing her medications there are numerous medications listed with overlap.  For example there are 2 different doses of Xanax listed, 3 different muscle relaxers listed, all 3 hydrocodone, oxycodone and Ultram listed in her medicine record.  She does not have her pill bottles with her today to verify.  She says she has been out of all of her pain related medicines for the last several months  She is asking for refills today on fentanyl, muscle relaxer, other pain medicine.  No other aggravating or relieving factors.    No other c/o.  Past Medical History:  Diagnosis Date   Anxiety    Chest pain, unspecified    Cough    Depression    Fibromyalgia    Headache(784.0)    Other and unspecified hyperlipidemia    mixed   Personal history of unspecified circulatory disease    Unspecified essential hypertension    Current Outpatient Medications on File Prior to Visit  Medication Sig Dispense Refill   ALPRAZolam (XANAX) 1 MG tablet Take 1 mg by mouth every 4 (four) hours as needed.  3   alprazolam (XANAX) 2 MG tablet Take 2 mg by mouth every 4 (four) hours as needed for sleep.     amLODipine (NORVASC) 2.5 MG tablet TAKE 1 TABLET BY MOUTH EVERY DAY 90 tablet 3   Azelastine HCl 137 MCG/SPRAY  SOLN PLACE 2 SPRAYS INTO BOTH NOSTRILS 2 (TWO) TIMES DAILY. USE IN EACH NOSTRIL AS DIRECTED 90 mL 3   budesonide (RHINOCORT AQUA) 32 MCG/ACT nasal spray Place 2 sprays into both nostrils daily. 3 Bottle 3   diclofenac sodium (VOLTAREN) 1 % GEL 3 grams to 3 large joints up to 3 times daily 3 Tube 3   erythromycin ophthalmic ointment 1 application  as needed.     isosorbide mononitrate (IMDUR) 30 MG 24 hr tablet TAKE 1 TABLET BY MOUTH EVERY DAY 90 tablet 3   levocetirizine (XYZAL) 5 MG tablet Take 1 tablet (5 mg total) by mouth every evening. 90 tablet 3   linaclotide (LINZESS) 290 MCG CAPS capsule Take 1 capsule (290 mcg total) by mouth daily before breakfast. 30 capsule 6   LINZESS 145 MCG  CAPS capsule TAKE 1 CAPSULE BY MOUTH DAILY BEFORE BREAKFAST. 30 capsule 6   mirabegron ER (MYRBETRIQ) 25 MG TB24 tablet Take 25 mg by mouth as needed.     montelukast (SINGULAIR) 10 MG tablet Take 1 tablet (10 mg total) by mouth at bedtime. 90 tablet 3   nitroGLYCERIN (NITROSTAT) 0.4 MG SL tablet PLACE 1 TABLET UNDER THE TONGUE EVERY 5 MINUTES AS NEEDED FOR CHEST PAIN. 75 tablet 0   olopatadine (PATANOL) 0.1 % ophthalmic solution INSTILL 1 DROP INTO BOTH EYES TWICE A DAY 5 mL 12   pantoprazole (PROTONIX) 40 MG tablet TAKE 1 TABLET BY MOUTH EVERY DAY AT 6PM 90 tablet 1   polyethylene glycol (MIRALAX / GLYCOLAX) packet Take 17 g by mouth 2 (two) times daily. 14 each 0   promethazine (PHENERGAN) 25 MG tablet Take 25 mg by mouth every 6 (six) hours as needed for nausea or vomiting.     rosuvastatin (CRESTOR) 5 MG tablet TAKE 1 TABLET BY MOUTH EVERYDAY AT BEDTIME 90 tablet 1   spironolactone (ALDACTONE) 100 MG tablet      tamsulosin (FLOMAX) 0.4 MG CAPS capsule Take 0.4 mg by mouth daily.  3   triamterene-hydrochlorothiazide (MAXZIDE) 75-50 MG tablet Take 1 tablet by mouth as needed. 90 tablet 3   valACYclovir (VALTREX) 500 MG tablet Take 500 mg by mouth daily as needed (infection).     Vitamin D, Ergocalciferol, (DRISDOL) 1.25 MG (50000 UNIT) CAPS capsule Take 1 capsule by mouth once a week.     Sod Fluoride-Potassium Nitrate 1.1-5 % PSTE sodium fluoride 1.1 %-potassium nitrate 5 % dental paste  PLEASE SEE ATTACHED FOR DETAILED DIRECTIONS (Patient not taking: Reported on 09/29/2022)     No current facility-administered medications on file prior to visit.   Past Surgical History:  Procedure Laterality Date   anterior perineoplasty     posterior repair. 05/16/02   BLADDER SUSPENSION     CARDIAC CATHETERIZATION  2006   normal coronaries   ESOPHAGOGASTRODUODENOSCOPY N/A 02/09/2013   Procedure: ESOPHAGOGASTRODUODENOSCOPY (EGD);  Surgeon: Rachael Fee, MD;  Location[facility] ENDOSCOPY;  Service:  Endoscopy;  Laterality: N/A;   excision sebaceous cyst  11/2013   posterior scalp   HEMATOMA EVACUATION     vaginal hematoma 05/23/02   shoulder surgery     sebacceous cyst removal   UTERINE SUSPENSION      The following portions of the patient's history were reviewed and updated as appropriate: allergies, current medications, past family history, past medical history, past social history, past surgical history and problem list.  ROS Otherwise as in subjective above  Objective: BP 108/60   Pulse (!) 55   Temp 97.7  F (36.5 C)   Wt 162 lb 12.8 oz (73.8 kg)   LMP  (LMP Unknown)   BMI 26.28 kg/m   General appearance: alert, no distress, well developed, well nourished She seems to be tender everywhere throughout her back, she seems to have very limited range of motion expressing pain, back flexion only to about 20 degrees, back extension minimal, likewise she seems to have very limited neck range of motion.  She is tender throughout her rhomboids and upper back paraspinal region as well as lumbar region throughout. She does not seem to be able to move her arms behind her back and internal range of motion and external shoulder range of motion seems decreased as well.  Otherwise nontender of the shoulders.  No obvious swelling. Arms and legs with normal pulses Overall strength seems to be within normal limits of arms and legs.    Assessment: Encounter Diagnoses  Name Primary?   Chronic neck pain Yes   Chronic back pain, unspecified back location, unspecified back pain laterality    Fibromyalgia syndrome    Osteopenia, unspecified location    Hyperlipidemia LDL goal <100    Essential hypertension    Depression, major, recurrent, moderate (HCC)    Chronic pain syndrome    Pruritic condition    Chronic pain of both shoulders    Chronic nausea      Plan: I reviewed her lengthy medication list today.  We discussed her medicines listed, we discussed the need to bring her pill  bottles with her in the future.  We discussed that there are numerous overlapping of medications that are similar which can be a very big safety issue and dangerous.  I advised that we had our office in general do not manage chronic pain particular with narcotics.  I advised that I would go ahead and place a referral to pain clinic on her behalf.  She has not had imaging of the neck and back or shoulders recently.  She has some older imaging of the lumbar spine from 2014 and more recently imaging of the cervical spine a few years ago through orthopedics, [facility].  She states that her gynecologist has been refilling her pain medicine up until December.  I advised that I will help with the regimen below getting her back on gabapentin which could be helpful as well as sticking with 1 specific muscle relaxer.  I recommended she seek out massage therapy, stretching regularly and consider water aerobics.  We discussed the following recommendations below and I advise she follow-up with her PCP or here as well.  Labs as below today.    Chronic pain, neck pain, back pain, fibromyalgia, spasm of muscles in the neck and back Begin back on gabapentin 300 mg once daily for 5 days, then increase to twice daily to help with your chronic pain Begin back on muscle relaxer tizanidine twice daily through the weekend to help with spasm and tightness of the muscles I will place referral to pain management to help with your pain issues I recommend you seek massage therapy to help with some of these issues as well.  There are several local massage therapist.  Check online on Google as I do not have a specific massage therapist.  This is usually out-of-pocket as insurance often does not pay for massage therapy Try to stretch every day Consider water aerobics at the [facility] I wrote a few hydrocodone short-term to help with some acute breakthrough pain We do not prescribe chronic  narcotic pain medicine here.  Since  you are on prior medicines like fentanyl and oxycodone, you will need to talk to a pain management doctor about this There are also a lot of overlapping medicines in your chart record such as 3 different muscle laxer's, 2 different doses of Xanax, multiple chronic narcotic medications.  This is not safe. I recommend you return soon to see Dr. Susann Givens your primary care provider and bring your pill bottles with you so we can help sort this out    Of note, for the chart record, I discontinued several medicines that she does not currently have a supply of at home reportedly to help clarify and reconciled the medicine list.   Khylei was seen today for pain.  Diagnoses and all orders for this visit:  Chronic neck pain -     Ambulatory referral to Pain Clinic  Chronic back pain, unspecified back location, unspecified back pain laterality -     Ambulatory referral to Pain Clinic  Fibromyalgia syndrome -     Comprehensive metabolic panel -     CBC -     Ambulatory referral to Pain Clinic  Osteopenia, unspecified location  Hyperlipidemia LDL goal <100 -     Comprehensive metabolic panel -     CBC  Essential hypertension  Depression, major, recurrent, moderate (HCC)  Chronic pain syndrome -     Ambulatory referral to Pain Clinic  Pruritic condition  Chronic pain of both shoulders -     Ambulatory referral to Pain Clinic  Chronic nausea  Other orders -     gabapentin (NEURONTIN) 300 MG capsule; Take 1 capsule (300 mg total) by mouth 2 (two) times daily. Take 1 tablet in the morning and 1 tablet at night -     HYDROcodone-acetaminophen (NORCO) 10-325 MG tablet; Take 1 tablet by mouth 2 (two) times daily as needed. -     tiZANidine (ZANAFLEX) 2 MG tablet; Take 1 tablet (2 mg total) by mouth 2 (two) times daily as needed. -     ondansetron (ZOFRAN) 4 MG tablet; Take 1 tablet (4 mg total) by mouth every 8 (eight) hours as needed for nausea or vomiting.    Follow up: With PCP here  in pain management

## 2022-09-30 LAB — COMPREHENSIVE METABOLIC PANEL
ALT: 13 IU/L (ref 0–32)
AST: 21 IU/L (ref 0–40)
Albumin/Globulin Ratio: 1.5 (ref 1.2–2.2)
Albumin: 4.5 g/dL (ref 3.9–4.9)
Alkaline Phosphatase: 130 IU/L — ABNORMAL HIGH (ref 44–121)
BUN/Creatinine Ratio: 14 (ref 12–28)
BUN: 12 mg/dL (ref 8–27)
Bilirubin Total: 0.6 mg/dL (ref 0.0–1.2)
CO2: 25 mmol/L (ref 20–29)
Calcium: 10 mg/dL (ref 8.7–10.3)
Chloride: 101 mmol/L (ref 96–106)
Creatinine, Ser: 0.88 mg/dL (ref 0.57–1.00)
Globulin, Total: 3 g/dL (ref 1.5–4.5)
Glucose: 89 mg/dL (ref 70–99)
Potassium: 4.4 mmol/L (ref 3.5–5.2)
Sodium: 142 mmol/L (ref 134–144)
Total Protein: 7.5 g/dL (ref 6.0–8.5)
eGFR: 74 mL/min/{1.73_m2} (ref >59)

## 2022-09-30 LAB — CBC
Hematocrit: 38.7 % (ref 34.0–46.6)
MCHC: 32.3 g/dL (ref 31.5–35.7)
MCV: 77 fL — ABNORMAL LOW (ref 79–97)
Platelets: 318 10*3/uL (ref 150–450)
RDW: 14 % (ref 11.7–15.4)

## 2022-10-01 NOTE — Progress Notes (Signed)
Results sent through MyChart

## 2022-10-09 ENCOUNTER — Other Ambulatory Visit: Payer: Self-pay | Admitting: Medical

## 2022-10-10 ENCOUNTER — Other Ambulatory Visit: Payer: Self-pay | Admitting: Family Medicine

## 2022-10-10 NOTE — Telephone Encounter (Signed)
Pt was notified and has a couple days left. Pt was advised this is short term use only not for everyday. Pt did not request this refill at this time

## 2022-10-18 ENCOUNTER — Ambulatory Visit: Payer: Medicare Other | Admitting: Family Medicine

## 2022-10-20 DIAGNOSIS — E559 Vitamin D deficiency, unspecified: Secondary | ICD-10-CM | POA: Diagnosis not present

## 2022-10-20 DIAGNOSIS — D649 Anemia, unspecified: Secondary | ICD-10-CM | POA: Diagnosis not present

## 2022-10-20 DIAGNOSIS — G8929 Other chronic pain: Secondary | ICD-10-CM | POA: Diagnosis not present

## 2022-10-20 DIAGNOSIS — M25511 Pain in right shoulder: Secondary | ICD-10-CM | POA: Diagnosis not present

## 2022-10-20 DIAGNOSIS — M542 Cervicalgia: Secondary | ICD-10-CM | POA: Diagnosis not present

## 2022-10-21 ENCOUNTER — Other Ambulatory Visit: Payer: Self-pay | Admitting: Family Medicine

## 2022-10-21 DIAGNOSIS — E785 Hyperlipidemia, unspecified: Secondary | ICD-10-CM

## 2022-10-24 ENCOUNTER — Encounter: Payer: Self-pay | Admitting: Family Medicine

## 2022-10-24 ENCOUNTER — Ambulatory Visit (INDEPENDENT_AMBULATORY_CARE_PROVIDER_SITE_OTHER): Payer: 59 | Admitting: Family Medicine

## 2022-10-24 VITALS — BP 128/80 | HR 51 | Temp 98.4°F | Resp 16 | Ht 66.0 in | Wt 162.8 lb

## 2022-10-24 DIAGNOSIS — I201 Angina pectoris with documented spasm: Secondary | ICD-10-CM

## 2022-10-24 DIAGNOSIS — F331 Major depressive disorder, recurrent, moderate: Secondary | ICD-10-CM

## 2022-10-24 DIAGNOSIS — Z560 Unemployment, unspecified: Secondary | ICD-10-CM

## 2022-10-24 DIAGNOSIS — E785 Hyperlipidemia, unspecified: Secondary | ICD-10-CM | POA: Diagnosis not present

## 2022-10-24 DIAGNOSIS — Z23 Encounter for immunization: Secondary | ICD-10-CM

## 2022-10-24 DIAGNOSIS — K219 Gastro-esophageal reflux disease without esophagitis: Secondary | ICD-10-CM | POA: Diagnosis not present

## 2022-10-24 DIAGNOSIS — I10 Essential (primary) hypertension: Secondary | ICD-10-CM

## 2022-10-24 DIAGNOSIS — M8580 Other specified disorders of bone density and structure, unspecified site: Secondary | ICD-10-CM | POA: Diagnosis not present

## 2022-10-24 DIAGNOSIS — Z0000 Encounter for general adult medical examination without abnormal findings: Secondary | ICD-10-CM | POA: Diagnosis not present

## 2022-10-24 DIAGNOSIS — M797 Fibromyalgia: Secondary | ICD-10-CM | POA: Diagnosis not present

## 2022-10-24 DIAGNOSIS — J302 Other seasonal allergic rhinitis: Secondary | ICD-10-CM

## 2022-10-24 DIAGNOSIS — G894 Chronic pain syndrome: Secondary | ICD-10-CM

## 2022-10-24 DIAGNOSIS — Z1231 Encounter for screening mammogram for malignant neoplasm of breast: Secondary | ICD-10-CM

## 2022-10-24 DIAGNOSIS — J309 Allergic rhinitis, unspecified: Secondary | ICD-10-CM

## 2022-10-24 DIAGNOSIS — K5900 Constipation, unspecified: Secondary | ICD-10-CM

## 2022-10-24 DIAGNOSIS — E559 Vitamin D deficiency, unspecified: Secondary | ICD-10-CM | POA: Diagnosis not present

## 2022-10-24 DIAGNOSIS — Z8742 Personal history of other diseases of the female genital tract: Secondary | ICD-10-CM

## 2022-10-24 MED ORDER — ROSUVASTATIN CALCIUM 5 MG PO TABS: 5.0000 mg | ORAL_TABLET | Freq: Every day | ORAL | 3 refills | Status: DC

## 2022-10-24 MED ORDER — MONTELUKAST SODIUM 10 MG PO TABS: 10.0000 mg | ORAL_TABLET | Freq: Every day | ORAL | 3 refills | Status: DC

## 2022-10-24 MED ORDER — AZELASTINE HCL 137 MCG/SPRAY NA SOLN: 2.0000 | Freq: Two times a day (BID) | NASAL | 3 refills | Status: DC

## 2022-10-24 MED ORDER — GABAPENTIN 300 MG PO CAPS: 300.0000 mg | ORAL_CAPSULE | Freq: Two times a day (BID) | ORAL | 0 refills | Status: DC

## 2022-10-24 MED ORDER — OLOPATADINE HCL 0.1 % OP SOLN
1.0000 [drp] | Freq: Two times a day (BID) | OPHTHALMIC | 12 refills | Status: DC
Start: 2022-10-24 — End: 2023-01-09

## 2022-10-24 MED ORDER — AMLODIPINE BESYLATE 2.5 MG PO TABS
ORAL_TABLET | ORAL | 3 refills | Status: DC
Start: 2022-10-24 — End: 2023-10-16

## 2022-10-24 MED ORDER — BUDESONIDE 32 MCG/ACT NA SUSP: 2.0000 | Freq: Every day | NASAL | 3 refills | Status: DC

## 2022-10-24 MED ORDER — LINACLOTIDE 290 MCG PO CAPS
290.0000 ug | ORAL_CAPSULE | Freq: Every day | ORAL | 6 refills | Status: DC
Start: 2022-10-24 — End: 2023-10-25

## 2022-10-24 MED ORDER — LEVOCETIRIZINE DIHYDROCHLORIDE 5 MG PO TABS
5.0000 mg | ORAL_TABLET | Freq: Every evening | ORAL | 3 refills | Status: DC
Start: 2022-10-24 — End: 2023-10-16

## 2022-10-24 MED ORDER — ISOSORBIDE MONONITRATE ER 30 MG PO TB24: ORAL_TABLET | ORAL | 3 refills | Status: DC

## 2022-10-24 MED ORDER — PANTOPRAZOLE SODIUM 40 MG PO TBEC
DELAYED_RELEASE_TABLET | ORAL | 3 refills | Status: DC
Start: 2022-10-24 — End: 2023-10-25

## 2022-10-24 MED ORDER — LINACLOTIDE 145 MCG PO CAPS
ORAL_CAPSULE | ORAL | 3 refills | Status: DC
Start: 2022-10-24 — End: 2023-10-25

## 2022-10-24 NOTE — Progress Notes (Signed)
Gina Espinoza is a 62 y.o. female who presents for annual wellness visit, CPE and follow-up on chronic medical conditions.  She does need multiple medications renewed.  She was recently seen by orthopedics and has had MRIs on her shoulder and her neck.  She was recently given gabapentin to help with the neck and shoulder pain but did not get much relief from that.  Results are not complete she does have an underlying history of angina and does follow up with cardiology.  She is on 2 Linzess pills today to keep her GI symptoms under control.  Presents for she is not taking Myrbetriq.  She has been seen by gynecology and apparently had a cervical procedure done.  I think she actually had a cystocele taken care of.  She continues on Crestor and having no difficulty with that.  She is also taking triamterene/HCTZ and Norvasc.  Continues on Imdur and does have Nitrostat.  Immunizations and Health Maintenance Immunization History  Administered Date(s) Administered   Hepatitis B 03/10/2008, 04/10/2008, 09/08/2008   Influenza,inj,Quad PF,6+ Mos 02/11/2013, 06/18/2014, 06/08/2015, 02/24/2016, 02/20/2018   Influenza-Unspecified 02/19/2018   MMR 03/10/2008, 04/10/2008   Td 09/23/2018   Tdap 03/10/2008, 09/23/2018   Varicella 02/28/2008, 04/10/2008   Zoster Recombinat (Shingrix) 11/17/2016   Health Maintenance Due  Topic Date Due   COVID-19 Vaccine (1) Never done   HIV Screening  Never done   Zoster Vaccines- Shingrix (2 of 2) 01/12/2017   MAMMOGRAM  02/09/2022   Medicare Annual Wellness (AWV)  10/13/2022    Last Pap smear: 02/09/2020 Last mammogram:02/10/2020 Last colonoscopy:06/27/2017 Last DEXA: not indicated Dentist: within the last year Ophtho: within the last year Exercise: waking daily  Other doctors caring for patient include: Dr. [place].  Dr. Lerry Paterson Dr. Christella Hartigan.  Dr. Eustaquio Boyden  Advanced directives: no    Depression screen:  See questionnaire below.     10/24/2022    2:59 PM 10/12/2021     2:10 PM 09/28/2020    8:40 AM 09/28/2020    8:39 AM 09/24/2019    2:46 PM  Depression screen PHQ 2/9  Decreased Interest 0 0 0 0 0  Down, Depressed, Hopeless 0 0 0 0 0  PHQ - 2 Score 0 0 0 0 0  Altered sleeping 2 3 3 3    Tired, decreased energy 1 0 2 2   Change in appetite 0 1 2 2    Feeling bad or failure about yourself  0 0 0 0   Trouble concentrating 0 0 0 0   Moving slowly or fidgety/restless 0 0 0 0   Suicidal thoughts 0 0 0 0   PHQ-9 Score 3 4 7 7    Difficult doing work/chores Not difficult at all Not difficult at all Not difficult at all Not difficult at all     Fall Risk Screen: see questionnaire below.    10/24/2022    2:58 PM 10/12/2021    1:58 PM 09/28/2020    8:39 AM 09/28/2020    8:38 AM 09/24/2019    2:46 PM  Fall Risk   Falls in the past year? 1 1 1 1 1   Number falls in past yr: 0 1 1 1 1   Injury with Fall? 0 0 1 1 1   Comment     bruised and scratched knee  Risk for fall due to : No Fall Risks History of fall(s) Other (Comment) Other (Comment)   Follow up Falls evaluation completed Falls evaluation completed Falls evaluation completed Falls  evaluation completed     ADL screen:  See questionnaire below Functional Status Survey:     Review of Systems Constitutional: -, -unexpected weight change, -anorexia, -fatigue Neurology: -, -numbness, , -memory loss, -falls, -dizziness    PHYSICAL EXAM:  BP 128/80   Pulse (!) 51   Temp 98.4 F (36.9 C) (Oral)   Resp 16   Ht 5\' 6"  (1.676 m)   Wt 162 lb 12.8 oz (73.8 kg)   LMP  (LMP Unknown)   SpO2 98% Comment: room air  BMI 26.28 kg/m   General Appearance: Alert, cooperative, no distress, appears stated age Head: Normocephalic, without obvious abnormality, atraumatic Eyes: PERRL, conjunctiva/corneas clear, EOM's intact, Ears: Normal TM's and external ear canals Nose: Nares normal, mucosa normal, no drainage or sinus tenderness Throat: Lips, mucosa, and tongue normal; teeth and gums normal Neck: Supple, no  lymphadenopathy;  thyroid:  no enlargement/tenderness/nodules; no carotid bruit or JVD Lungs: Clear to auscultation bilaterally without wheezes, rales or ronchi; respirations unlabored Heart: Regular rate and rhythm, S1 and S2 normal, no murmur, rubor gallop Abdomen: Soft, non-tender, nondistended, normoactive bowel sounds,  no masses, no hepatosplenomegaly Extremities: No clubbing, cyanosis or edema Pulses: 2+ and symmetric all extremities Skin:  Skin color, texture, turgor normal, no rashes or lesions Lymph nodes: Cervical, supraclavicular, and axillary nodes normal Neurologic:  CNII-XII intact, normal strength, sensation and gait; reflexes 2+ and symmetric throughout Psych: Normal mood, affect, hygiene and grooming.  ASSESSMENT/PLAN: Routine general medical examination at a health care facility  Essential hypertension - Plan: amLODipine (NORVASC) 2.5 MG tablet  Chronic seasonal allergic rhinitis - Plan: Azelastine HCl 137 MCG/SPRAY SOLN, budesonide (RHINOCORT AQUA) 32 MCG/ACT nasal spray, levocetirizine (XYZAL) 5 MG tablet, montelukast (SINGULAIR) 10 MG tablet, olopatadine (PATANOL) 0.1 % ophthalmic solution  Osteopenia, unspecified location - Plan: DG Bone Density  Encounter for screening mammogram for malignant neoplasm of breast  Need for shingles vaccine  Chronic pain syndrome - Plan: gabapentin (NEURONTIN) 300 MG capsule  Depression, major, recurrent, moderate (HCC)  Fibromyalgia syndrome  Hyperlipidemia LDL goal <100 - Plan: Lipid panel, rosuvastatin (CRESTOR) 5 MG tablet  Not currently working due to disabled status  History of abnormal cervical Pap smear  Constipation, unspecified constipation type - Plan: linaclotide (LINZESS) 290 MCG CAPS capsule, linaclotide (LINZESS) 145 MCG CAPS capsule  Vitamin D deficiency - Plan: VITAMIN D 25 Hydroxy (Vit-D Deficiency, Fractures)  Allergic rhinitis, unspecified seasonality, unspecified trigger - Plan: Azelastine HCl 137  MCG/SPRAY SOLN, montelukast (SINGULAIR) 10 MG tablet  Gastroesophageal reflux disease without esophagitis - Plan: pantoprazole (PROTONIX) 40 MG tablet  Coronary vasospasm (HCC) - Plan: isosorbide mononitrate (IMDUR) 30 MG 24 hr tablet    Discussed healthy diet, including goals of calcium and vitamin D intake and alcohol recommendations (less than or equal to 1 drink/day) reviewed;Marland Kitchen  Immunization recommendations discussed.  Has apparently had both shingles vaccines.  Colonoscopy recommendations reviewed   Medicare Attestation I have personally reviewed: The patient's medical and social history Their use of alcohol, tobacco or illicit drugs Their current medications and supplements The patient's functional ability including ADLs,fall risks, home safety risks, cognitive, and hearing and visual impairment Diet and physical activities Evidence for depression or mood disorders  The patient's weight, height, and BMI have been recorded in the chart.  I have made referrals, counseling, and provided education to the patient based on review of the above and I have provided the patient with a written personalized care plan for preventive services.  Sharlot Gowda, MD   10/24/2022

## 2022-10-25 LAB — LIPID PANEL
Chol/HDL Ratio: 3.1 ratio (ref 0.0–4.4)
Cholesterol, Total: 181 mg/dL (ref 100–199)
HDL: 58 mg/dL (ref >39)
LDL Chol Calc (NIH): 101 mg/dL — ABNORMAL HIGH (ref 0–99)
Triglycerides: 122 mg/dL (ref 0–149)
VLDL Cholesterol Cal: 22 mg/dL (ref 5–40)

## 2022-10-25 LAB — VITAMIN D 25 HYDROXY (VIT D DEFICIENCY, FRACTURES): Vit D, 25-Hydroxy: 114 ng/mL — ABNORMAL HIGH (ref 30.0–100.0)

## 2022-10-27 ENCOUNTER — Other Ambulatory Visit: Payer: Self-pay | Admitting: Family Medicine

## 2022-10-27 ENCOUNTER — Telehealth: Payer: Self-pay | Admitting: Family Medicine

## 2022-10-27 ENCOUNTER — Other Ambulatory Visit: Payer: Self-pay | Admitting: Medical

## 2022-10-27 DIAGNOSIS — R079 Chest pain, unspecified: Secondary | ICD-10-CM

## 2022-10-27 MED ORDER — NITROGLYCERIN 0.4 MG SL SUBL
SUBLINGUAL_TABLET | SUBLINGUAL | 0 refills | Status: AC
Start: 2022-10-27 — End: ?

## 2022-10-27 NOTE — Telephone Encounter (Signed)
Prescription Request  10/27/2022  LOV: 10/24/2022  What is the name of the medication or equipment? Nitroglycerin     Have you contacted your pharmacy to request a refill? Yes  yes Which pharmacy would you like this sent to?  [id number] - [place], [place] - [address] AT [facility] OF [address] [place] [address] Phone: [phone] Fax: [phone]    Patient notified that their request is being sent to the clinical staff for review and that they should receive a response within 2 business days.   Please advise at Mobile [phone] (mobile)  Pt states all her meds were refilled at her appt except her Nitroglycerin tabs,  needs asap

## 2022-11-04 ENCOUNTER — Other Ambulatory Visit: Payer: Self-pay | Admitting: Medical

## 2022-11-04 ENCOUNTER — Other Ambulatory Visit: Payer: Self-pay | Admitting: Family Medicine

## 2022-11-04 DIAGNOSIS — M542 Cervicalgia: Secondary | ICD-10-CM | POA: Diagnosis not present

## 2022-11-04 DIAGNOSIS — R079 Chest pain, unspecified: Secondary | ICD-10-CM

## 2022-11-08 ENCOUNTER — Other Ambulatory Visit: Payer: Self-pay | Admitting: Medical

## 2022-11-08 ENCOUNTER — Other Ambulatory Visit: Payer: Self-pay | Admitting: Family Medicine

## 2022-11-08 DIAGNOSIS — R079 Chest pain, unspecified: Secondary | ICD-10-CM

## 2022-11-08 NOTE — Telephone Encounter (Signed)
Is it ok to refill? Last refilled 10/28/2022.

## 2022-11-10 DIAGNOSIS — M542 Cervicalgia: Secondary | ICD-10-CM | POA: Diagnosis not present

## 2022-11-21 ENCOUNTER — Other Ambulatory Visit: Payer: Self-pay | Admitting: Family Medicine

## 2022-11-22 NOTE — Addendum Note (Signed)
Addended by: Herminio Commons A on: 11/22/2022 10:23 AM   Modules accepted: Orders

## 2022-12-26 ENCOUNTER — Other Ambulatory Visit: Payer: Self-pay | Admitting: Family Medicine

## 2023-01-07 ENCOUNTER — Other Ambulatory Visit: Payer: Self-pay | Admitting: Family Medicine

## 2023-01-09 ENCOUNTER — Telehealth: Payer: Self-pay | Admitting: Family Medicine

## 2023-01-09 MED ORDER — AZELASTINE HCL 0.05 % OP SOLN: 1.0000 [drp] | Freq: Two times a day (BID) | OPHTHALMIC | 12 refills | Status: DC

## 2023-01-09 NOTE — Telephone Encounter (Signed)
Pt called, FDA has pulled patanol eye drops She spoke to her insurance and they cover Azelastine 0.1%  She wants to see if you will send in rx for that one

## 2023-01-22 ENCOUNTER — Other Ambulatory Visit: Payer: Self-pay | Admitting: Family Medicine

## 2023-02-04 ENCOUNTER — Other Ambulatory Visit: Payer: Self-pay | Admitting: Family Medicine

## 2023-02-20 DIAGNOSIS — Z1231 Encounter for screening mammogram for malignant neoplasm of breast: Secondary | ICD-10-CM | POA: Diagnosis not present

## 2023-03-29 ENCOUNTER — Encounter: Payer: Self-pay | Admitting: Family Medicine

## 2023-03-29 ENCOUNTER — Ambulatory Visit (INDEPENDENT_AMBULATORY_CARE_PROVIDER_SITE_OTHER): Payer: 59 | Admitting: Family Medicine

## 2023-03-29 VITALS — BP 116/74 | HR 74 | Temp 98.2°F | Wt 165.2 lb

## 2023-03-29 DIAGNOSIS — L732 Hidradenitis suppurativa: Secondary | ICD-10-CM | POA: Diagnosis not present

## 2023-03-29 NOTE — Progress Notes (Signed)
   Subjective:    Patient ID: Gina Espinoza, female    DOB: [birth year], 62 y.o.   MRN: [id number]  HPI She is here for evaluation of right axillary pain and swelling.  She has a previous history of axillary abscess with I&D done in 2017 by Dr. Corliss Skains.  She states that the swelling is in the same linear pattern as his previous surgery.   Review of Systems     Objective:    Physical Exam  Exam of the right axilla does show multiple abscesses in a linear pattern from previous incision.      Assessment & Plan:  Hidradenitis suppurativa of right axilla - Plan: Ambulatory referral to General Surgery I placed an urgent referral back.  Explained to her that this is going to be more than just a simple I&D.

## 2023-03-30 DIAGNOSIS — L732 Hidradenitis suppurativa: Secondary | ICD-10-CM | POA: Diagnosis not present

## 2023-06-05 ENCOUNTER — Other Ambulatory Visit: Payer: Self-pay

## 2023-06-05 ENCOUNTER — Encounter (HOSPITAL_BASED_OUTPATIENT_CLINIC_OR_DEPARTMENT_OTHER): Payer: Self-pay | Admitting: General Surgery

## 2023-06-08 ENCOUNTER — Encounter (HOSPITAL_BASED_OUTPATIENT_CLINIC_OR_DEPARTMENT_OTHER)
Admission: RE | Admit: 2023-06-08 | Discharge: 2023-06-08 | Disposition: A | Discharge status: Home / Self Care | Payer: 59 | Source: Ambulatory Visit | Attending: General Surgery | Admitting: General Surgery

## 2023-06-08 DIAGNOSIS — Z01812 Encounter for preprocedural laboratory examination: Secondary | ICD-10-CM | POA: Insufficient documentation

## 2023-06-08 LAB — BASIC METABOLIC PANEL
Anion gap: 9 (ref 5–15)
BUN: 12 mg/dL (ref 8–23)
CO2: 26 mmol/L (ref 22–32)
Calcium: 9.5 mg/dL (ref 8.9–10.3)
Chloride: 104 mmol/L (ref 98–111)
Creatinine, Ser: 0.95 mg/dL (ref 0.44–1.00)
GFR, Estimated: >60 mL/min (ref >60)
Glucose, Bld: 93 mg/dL (ref 70–99)
Potassium: 4 mmol/L (ref 3.5–5.1)
Sodium: 139 mmol/L (ref 135–145)

## 2023-06-12 ENCOUNTER — Ambulatory Visit: Payer: Self-pay | Admitting: General Surgery

## 2023-06-13 ENCOUNTER — Ambulatory Visit (HOSPITAL_BASED_OUTPATIENT_CLINIC_OR_DEPARTMENT_OTHER): Payer: 59 | Admitting: Anesthesiology

## 2023-06-13 ENCOUNTER — Ambulatory Visit (HOSPITAL_BASED_OUTPATIENT_CLINIC_OR_DEPARTMENT_OTHER)
Admission: RE | Admit: 2023-06-13 | Discharge: 2023-06-13 | Disposition: A | Discharge status: Home / Self Care | Payer: 59 | Attending: General Surgery | Admitting: General Surgery

## 2023-06-13 ENCOUNTER — Encounter (HOSPITAL_BASED_OUTPATIENT_CLINIC_OR_DEPARTMENT_OTHER): Payer: Self-pay | Admitting: General Surgery

## 2023-06-13 ENCOUNTER — Encounter (HOSPITAL_BASED_OUTPATIENT_CLINIC_OR_DEPARTMENT_OTHER)
Admission: RE | Disposition: A | Discharge status: Home / Self Care | Payer: Self-pay | Source: Home / Self Care | Attending: General Surgery

## 2023-06-13 ENCOUNTER — Other Ambulatory Visit: Payer: Self-pay

## 2023-06-13 DIAGNOSIS — Z79899 Other long term (current) drug therapy: Secondary | ICD-10-CM | POA: Diagnosis not present

## 2023-06-13 DIAGNOSIS — K219 Gastro-esophageal reflux disease without esophagitis: Secondary | ICD-10-CM | POA: Diagnosis not present

## 2023-06-13 DIAGNOSIS — M797 Fibromyalgia: Secondary | ICD-10-CM | POA: Diagnosis not present

## 2023-06-13 DIAGNOSIS — L732 Hidradenitis suppurativa: Secondary | ICD-10-CM

## 2023-06-13 DIAGNOSIS — F418 Other specified anxiety disorders: Secondary | ICD-10-CM

## 2023-06-13 DIAGNOSIS — I10 Essential (primary) hypertension: Secondary | ICD-10-CM | POA: Diagnosis not present

## 2023-06-13 DIAGNOSIS — E785 Hyperlipidemia, unspecified: Secondary | ICD-10-CM | POA: Diagnosis not present

## 2023-06-13 DIAGNOSIS — L905 Scar conditions and fibrosis of skin: Secondary | ICD-10-CM | POA: Diagnosis not present

## 2023-06-13 DIAGNOSIS — R001 Bradycardia, unspecified: Secondary | ICD-10-CM

## 2023-06-13 HISTORY — DX: Gastro-esophageal reflux disease without esophagitis: K21.9

## 2023-06-13 HISTORY — DX: Other specified postprocedural states: Z98.890

## 2023-06-13 HISTORY — PX: HYDRADENITIS EXCISION: SHX5243

## 2023-06-13 HISTORY — DX: Other specified postprocedural states: R11.2

## 2023-06-13 SURGERY — EXCISION, HIDRADENITIS, AXILLA
Anesthesia: General | Site: Axilla | Laterality: Right

## 2023-06-13 MED ORDER — HYDROMORPHONE HCL 1 MG/ML IJ SOLN
INTRAMUSCULAR | Status: AC
  Filled 2023-06-13: qty 0.5

## 2023-06-13 MED ORDER — 0.9 % SODIUM CHLORIDE (POUR BTL) OPTIME
TOPICAL | Status: DC | PRN
  Administered 2023-06-13: 1000 mL

## 2023-06-13 MED ORDER — OXYCODONE HCL 5 MG PO TABS
5.0000 mg | ORAL_TABLET | Freq: Once | ORAL | Status: AC | PRN
  Administered 2023-06-13: 5 mg via ORAL

## 2023-06-13 MED ORDER — ACETAMINOPHEN 500 MG PO TABS
1000.0000 mg | ORAL_TABLET | ORAL | Status: AC
  Administered 2023-06-13: 1000 mg via ORAL

## 2023-06-13 MED ORDER — ONDANSETRON HCL 4 MG/2ML IJ SOLN
INTRAMUSCULAR | Status: AC
  Filled 2023-06-13: qty 2

## 2023-06-13 MED ORDER — ONDANSETRON HCL 4 MG/2ML IJ SOLN
4.0000 mg | Freq: Once | INTRAMUSCULAR | Status: AC | PRN
Start: 2023-06-13 — End: 2023-06-13
  Administered 2023-06-13: 4 mg via INTRAVENOUS

## 2023-06-13 MED ORDER — ENOXAPARIN SODIUM 40 MG/0.4ML IJ SOSY
PREFILLED_SYRINGE | INTRAMUSCULAR | Status: AC
  Filled 2023-06-13: qty 0.4

## 2023-06-13 MED ORDER — FENTANYL CITRATE (PF) 100 MCG/2ML IJ SOLN
INTRAMUSCULAR | Status: AC
  Filled 2023-06-13: qty 2

## 2023-06-13 MED ORDER — ENOXAPARIN SODIUM 40 MG/0.4ML IJ SOSY
40.0000 mg | PREFILLED_SYRINGE | Freq: Once | INTRAMUSCULAR | Status: AC
  Administered 2023-06-13: 40 mg via SUBCUTANEOUS

## 2023-06-13 MED ORDER — AMISULPRIDE (ANTIEMETIC) 5 MG/2ML IV SOLN
INTRAVENOUS | Status: AC
  Filled 2023-06-13: qty 4

## 2023-06-13 MED ORDER — SCOPOLAMINE 1 MG/3DAYS TD PT72
MEDICATED_PATCH | TRANSDERMAL | Status: AC
  Filled 2023-06-13: qty 1

## 2023-06-13 MED ORDER — DROPERIDOL 2.5 MG/ML IJ SOLN
INTRAMUSCULAR | Status: AC
  Filled 2023-06-13: qty 2

## 2023-06-13 MED ORDER — GABAPENTIN 300 MG PO CAPS
ORAL_CAPSULE | ORAL | Status: AC
  Filled 2023-06-13: qty 1

## 2023-06-13 MED ORDER — MIDAZOLAM HCL 2 MG/2ML IJ SOLN
INTRAMUSCULAR | Status: AC
  Filled 2023-06-13: qty 2

## 2023-06-13 MED ORDER — FENTANYL CITRATE (PF) 100 MCG/2ML IJ SOLN
INTRAMUSCULAR | Status: DC | PRN
  Administered 2023-06-13 (×2): 50 ug via INTRAVENOUS

## 2023-06-13 MED ORDER — MIDAZOLAM HCL 5 MG/5ML IJ SOLN
INTRAMUSCULAR | Status: DC | PRN
  Administered 2023-06-13: 2 mg via INTRAVENOUS

## 2023-06-13 MED ORDER — CEFAZOLIN SODIUM-DEXTROSE 2-4 GM/100ML-% IV SOLN
2.0000 g | INTRAVENOUS | Status: AC
  Administered 2023-06-13: 2 g via INTRAVENOUS

## 2023-06-13 MED ORDER — ACETAMINOPHEN 500 MG PO TABS: 1000.0000 mg | ORAL_TABLET | Freq: Once | ORAL | Status: DC

## 2023-06-13 MED ORDER — DEXAMETHASONE SODIUM PHOSPHATE 10 MG/ML IJ SOLN
INTRAMUSCULAR | Status: AC
  Filled 2023-06-13: qty 1

## 2023-06-13 MED ORDER — ONDANSETRON HCL 4 MG/2ML IJ SOLN
INTRAMUSCULAR | Status: DC | PRN
  Administered 2023-06-13: 4 mg via INTRAVENOUS

## 2023-06-13 MED ORDER — BUPIVACAINE LIPOSOME 1.3 % IJ SUSP: 20.0000 mL | Freq: Once | INTRAMUSCULAR | Status: DC

## 2023-06-13 MED ORDER — GABAPENTIN 300 MG PO CAPS
300.0000 mg | ORAL_CAPSULE | ORAL | Status: AC
  Administered 2023-06-13: 300 mg via ORAL

## 2023-06-13 MED ORDER — AMISULPRIDE (ANTIEMETIC) 5 MG/2ML IV SOLN
10.0000 mg | Freq: Once | INTRAVENOUS | Status: AC | PRN
  Administered 2023-06-13: 10 mg via INTRAVENOUS

## 2023-06-13 MED ORDER — BUPIVACAINE-EPINEPHRINE (PF) 0.25% -1:200000 IJ SOLN
INTRAMUSCULAR | Status: AC
  Filled 2023-06-13: qty 30

## 2023-06-13 MED ORDER — PROPOFOL 10 MG/ML IV BOLUS
INTRAVENOUS | Status: AC
  Filled 2023-06-13: qty 20

## 2023-06-13 MED ORDER — CEFAZOLIN SODIUM-DEXTROSE 2-4 GM/100ML-% IV SOLN
INTRAVENOUS | Status: AC
  Filled 2023-06-13: qty 100

## 2023-06-13 MED ORDER — ACETAMINOPHEN 500 MG PO TABS
ORAL_TABLET | ORAL | Status: AC
  Filled 2023-06-13: qty 2

## 2023-06-13 MED ORDER — OXYCODONE HCL 5 MG/5ML PO SOLN: 5.0000 mg | Freq: Once | ORAL | Status: AC | PRN

## 2023-06-13 MED ORDER — CELECOXIB 200 MG PO CAPS
200.0000 mg | ORAL_CAPSULE | ORAL | Status: AC
  Administered 2023-06-13: 200 mg via ORAL

## 2023-06-13 MED ORDER — PROPOFOL 10 MG/ML IV BOLUS
INTRAVENOUS | Status: DC | PRN
  Administered 2023-06-13: 200 mg via INTRAVENOUS

## 2023-06-13 MED ORDER — CELECOXIB 200 MG PO CAPS
ORAL_CAPSULE | ORAL | Status: AC
  Filled 2023-06-13: qty 1

## 2023-06-13 MED ORDER — OXYCODONE HCL 5 MG PO TABS
ORAL_TABLET | ORAL | Status: AC
  Filled 2023-06-13: qty 1

## 2023-06-13 MED ORDER — HYDROMORPHONE HCL 1 MG/ML IJ SOLN
0.2500 mg | INTRAMUSCULAR | Status: DC | PRN
Start: 2023-06-13 — End: 2023-06-13
  Administered 2023-06-13 (×3): 0.5 mg via INTRAVENOUS

## 2023-06-13 MED ORDER — BUPIVACAINE-EPINEPHRINE 0.25% -1:200000 IJ SOLN
INTRAMUSCULAR | Status: DC | PRN
  Administered 2023-06-13: 21 mL

## 2023-06-13 MED ORDER — LACTATED RINGERS IV SOLN: INTRAVENOUS | Status: DC

## 2023-06-13 MED ORDER — LIDOCAINE 2% (20 MG/ML) 5 ML SYRINGE
INTRAMUSCULAR | Status: AC
  Filled 2023-06-13: qty 5

## 2023-06-13 MED ORDER — DEXAMETHASONE SODIUM PHOSPHATE 4 MG/ML IJ SOLN
INTRAMUSCULAR | Status: DC | PRN
  Administered 2023-06-13: 10 mg via INTRAVENOUS

## 2023-06-13 MED ORDER — DROPERIDOL 2.5 MG/ML IJ SOLN
0.6250 mg | Freq: Once | INTRAMUSCULAR | Status: AC
  Administered 2023-06-13: 0.625 mg via INTRAVENOUS

## 2023-06-13 MED ORDER — OXYCODONE HCL 5 MG PO TABS: 2.5000 mg | ORAL_TABLET | ORAL | 0 refills | Status: DC | PRN

## 2023-06-13 MED ORDER — CHLORHEXIDINE GLUCONATE CLOTH 2 % EX PADS: 6.0000 | MEDICATED_PAD | Freq: Once | CUTANEOUS | Status: DC

## 2023-06-13 MED ORDER — BUPIVACAINE HCL (PF) 0.25 % IJ SOLN
INTRAMUSCULAR | Status: AC
  Filled 2023-06-13: qty 30

## 2023-06-13 MED ORDER — SCOPOLAMINE 1 MG/3DAYS TD PT72
1.0000 | MEDICATED_PATCH | TRANSDERMAL | Status: DC
  Administered 2023-06-13: 1.5 mg via TRANSDERMAL

## 2023-06-13 MED ORDER — EPHEDRINE SULFATE (PRESSORS) 50 MG/ML IJ SOLN
INTRAMUSCULAR | Status: DC | PRN
  Administered 2023-06-13 (×2): 10 mg via INTRAVENOUS
  Administered 2023-06-13: 5 mg via INTRAVENOUS

## 2023-06-13 MED ORDER — LIDOCAINE 2% (20 MG/ML) 5 ML SYRINGE
INTRAMUSCULAR | Status: DC | PRN
  Administered 2023-06-13: 60 mg via INTRAVENOUS

## 2023-06-13 SURGICAL SUPPLY — 39 items
APPLIER CLIP 9.375 MED OPEN (MISCELLANEOUS) IMPLANT
BLADE SURG 10 STRL SS (BLADE) IMPLANT
BLADE SURG 15 STRL LF DISP TIS (BLADE) IMPLANT
CANISTER SUCT 1200ML W/VALVE (MISCELLANEOUS) ×1 IMPLANT
CHLORAPREP W/TINT 26 (MISCELLANEOUS) ×1 IMPLANT
CLIP APPLIE 9.375 MED OPEN (MISCELLANEOUS) IMPLANT
COVER BACK TABLE 60X90IN (DRAPES) ×1 IMPLANT
COVER MAYO STAND STRL (DRAPES) ×1 IMPLANT
DRAIN CHANNEL 19F RND (DRAIN) IMPLANT
DRAPE LAPAROSCOPIC ABDOMINAL (DRAPES) ×1 IMPLANT
DRAPE LAPAROTOMY 100X72 PEDS (DRAPES) ×1 IMPLANT
DRAPE UTILITY XL STRL (DRAPES) ×1 IMPLANT
ELECT CAUTERY BLADE 6.4 (BLADE) IMPLANT
ELECT COATED BLADE 2.86 ST (ELECTRODE) ×1 IMPLANT
ELECT REM PT RETURN 9FT ADLT (ELECTROSURGICAL) ×1 IMPLANT
ELECTRODE REM PT RTRN 9FT ADLT (ELECTROSURGICAL) ×1 IMPLANT
EVACUATOR SILICONE 100CC (DRAIN) IMPLANT
GAUZE SPONGE 4X4 12PLY STRL (GAUZE/BANDAGES/DRESSINGS) ×1 IMPLANT
GLOVE BIOGEL PI MICRO STRL 6 (GLOVE) ×1 IMPLANT
GLOVE INDICATOR 6.5 STRL GRN (GLOVE) ×1 IMPLANT
GOWN STRL REUS W/ TWL LRG LVL3 (GOWN DISPOSABLE) ×2 IMPLANT
NDL HYPO 25X1 1.5 SAFETY (NEEDLE) ×1 IMPLANT
NEEDLE HYPO 25X1 1.5 SAFETY (NEEDLE) ×1 IMPLANT
NS IRRIG 1000ML POUR BTL (IV SOLUTION) ×1 IMPLANT
PACK BASIN DAY SURGERY FS (CUSTOM PROCEDURE TRAY) ×1 IMPLANT
PENCIL SMOKE EVACUATOR (MISCELLANEOUS) ×1 IMPLANT
SLEEVE SCD COMPRESS KNEE MED (STOCKING) IMPLANT
SPONGE T-LAP 4X18 ~~LOC~~+RFID (SPONGE) IMPLANT
STAPLER SKIN PROX WIDE 3.9 (STAPLE) ×1 IMPLANT
SUT ETHILON 2 0 FS 18 (SUTURE) IMPLANT
SUT MNCRL AB 4-0 PS2 18 (SUTURE) IMPLANT
SUT VIC AB 3-0 SH 27X BRD (SUTURE) ×1 IMPLANT
SUT VICRYL 3-0 CR8 SH (SUTURE) IMPLANT
SYR 10ML LL (SYRINGE) IMPLANT
SYR BULB EAR ULCER 3OZ GRN STR (SYRINGE) IMPLANT
SYR CONTROL 10ML LL (SYRINGE) IMPLANT
TOWEL GREEN STERILE FF (TOWEL DISPOSABLE) ×1 IMPLANT
TUBE CONNECTING 20X1/4 (TUBING) IMPLANT
YANKAUER SUCT BULB TIP NO VENT (SUCTIONS) IMPLANT

## 2023-06-13 NOTE — H&P (Signed)
HPI  Gina Espinoza is a 63 y.o. female with history of fibromyalgia. Angina. HTN, chronic headaches who is seen today as an office consultation for evaluation of hidradenitis suppurtiva. Patient has history of this and previous had excision of right axillary mass and tract by Dr. Corliss Skains in 2017. Since then she had several years without issues. 2 years ago she started to have recurrent symptoms and states she had tried to get back into our clinic but was unable to. Recently patient has noticed about 2 weeks of increased pain and redness to her right axilla. No fevers/chills. She has not noted any drainage from the area. She also states several months ago she had pain in her left groin region that she presented to ED and had lanced. She states she is having similar episodes in this area as in her axilla. She is asymptomatic in this area at this time and wishes to defer an examination of this region.   10 point review of systems is negative except as listed above in HPI.  Objective  Past Medical History: Past Medical History:  Diagnosis Date   Anxiety    Chest pain, unspecified    Cough    Depression    Fibromyalgia    GERD (gastroesophageal reflux disease)    Headache(784.0)    Other and unspecified hyperlipidemia    mixed   Personal history of unspecified circulatory disease    PONV (postoperative nausea and vomiting)    Unspecified essential hypertension     Past Surgical History: Past Surgical History:  Procedure Laterality Date   anterior perineoplasty     posterior repair. 05/16/02   BLADDER SUSPENSION     CARDIAC CATHETERIZATION  2006   normal coronaries   ESOPHAGOGASTRODUODENOSCOPY N/A 02/09/2013   Procedure: ESOPHAGOGASTRODUODENOSCOPY (EGD);  Surgeon: Rachael Fee, MD;  Location: [facility] ENDOSCOPY;  Service: Endoscopy;  Laterality: N/A;   excision sebaceous cyst  11/2013   posterior scalp   HEMATOMA EVACUATION     vaginal hematoma 05/23/02   shoulder surgery      sebacceous cyst removal   UTERINE SUSPENSION      Family History:  Family History  Problem Relation Age of Onset   Allergies Mother        also children   Heart disease Father    Asthma Daughter    Breast cancer Maternal Grandmother    Colon cancer Brother    Asthma Grandchild     Social History:  reports that she has never smoked. She has never used smokeless tobacco. She reports that she does not drink alcohol and does not use drugs.  Allergies:  Allergies  Allergen Reactions   Savella [Milnacipran Hcl] Anaphylaxis   Amitiza [Lubiprostone]     Passed out   Bee Venom Swelling   Codeine Hives and Itching   Ivp Dye [Iodinated Contrast Media] Hives and Itching    Medications: I have reviewed the patient's current medications.  Labs: I have personally reviewed all labs for the past 24h No results found for this or any previous visit (from the past 48 hours).  Imaging: I have personally reviewed and interpreted all imaging for the past 24h and agree with the radiologist's impression.  No results found.   Physical Exam Height 5\' 6"  (1.676 m), weight 75.3 kg. General: No acute distress, well appearing HEENT: PERRL, hearing grossly normal, mucous membranes moist CV: Regular rate and rhythm Pulm: Normal work of breathing on room air Abd: Soft, nontender,  nondistended GU: Deferred Extremities: Right axilla with 4 obvious lesions with thickened skin in a linear fashion along previous surgical scar. Erythema resolved. Cord like thickening of skin underlying these lesions. Neuro: A&O x4, no focal neurologic deficits Psych: Appropriate mood and effect     Assessment   Gina Espinoza is an 64 y.o. female who presents for excision of right axillary hidradenitis   Plan  - Proceed to OR. Risks including but not limited to bleeding, infection, recurrence, wound breakdown have been discussed. Patient understands these risks and wishes to proceed.  Donata Duff, MD [facility]

## 2023-06-13 NOTE — Discharge Instructions (Signed)
  Post Anesthesia Home Care Instructions  Activity: Get plenty of rest for the remainder of the day. A responsible individual must stay with you for 24 hours following the procedure.  For the next 24 hours, DO NOT: -Drive a car -Advertising copywriter -Drink alcoholic beverages -Take any medication unless instructed by your physician -Make any legal decisions or sign important papers.  Meals: Start with liquid foods such as gelatin or soup. Progress to regular foods as tolerated. Avoid greasy, spicy, heavy foods. If nausea and/or vomiting occur, drink only clear liquids until the nausea and/or vomiting subsides. Call your physician if vomiting continues.  Special Instructions/Symptoms: Your throat may feel dry or sore from the anesthesia or the breathing tube placed in your throat during surgery. If this causes discomfort, gargle with warm salt water. The discomfort should disappear within 24 hours.  If you had a scopolamine patch placed behind your ear for the management of post- operative nausea and/or vomiting:  1. The medication in the patch is effective for 72 hours, after which it should be removed.  Wrap patch in a tissue and discard in the trash. Wash hands thoroughly with soap and water. 2. You may remove the patch earlier than 72 hours if you experience unpleasant side effects which may include dry mouth, dizziness or visual disturbances. 3. Avoid touching the patch. Wash your hands with soap and water after contact with the patch.    *May have Tylenol/Ibuprofen today at 5pm*

## 2023-06-13 NOTE — Anesthesia Preprocedure Evaluation (Addendum)
Anesthesia Evaluation  Patient identified by MRN, date of birth, ID band Patient awake    Reviewed: Allergy & Precautions, NPO status , Patient's Chart, lab work & pertinent test results  History of Anesthesia Complications (+) PONV and history of anesthetic complications  Airway Mallampati: III  TM Distance: >3 FB Neck ROM: Full    Dental  (+) Teeth Intact, Dental Advisory Given Lower braces:   Pulmonary neg pulmonary ROS   Pulmonary exam normal breath sounds clear to auscultation       Cardiovascular hypertension (128/80 preop), Pt. on medications Normal cardiovascular exam Rhythm:Regular Rate:Normal  Stress echo 10/2020 for chest pain:  Nuclear stress EF: 46%.  The left ventricular ejection fraction is mildly decreased (45-54%).  T wave inversion was noted during stress in the II, III, aVF, V4, V5, V6, V3 and V2 leads (more prominent than baseline TWI) that resolved in the post-infusion period. May represent episode of vasospasm given patient's history.  Normal myocardial perfusion with no evidence of ischemia or infarction.  This is an intermediate risk study due to mildly reduced LVEF. Recommend TTE for further evaluation.    Echo 11/2020 1. Left ventricular ejection fraction, by estimation, is 60 to 65%. Left  ventricular ejection fraction by 3D volume is 59 %. The left ventricle has  normal function. The left ventricle has no regional wall motion  abnormalities. Left ventricular diastolic   parameters were normal. The average left ventricular global longitudinal  strain is -22.7 %. The global longitudinal strain is normal.   2. Right ventricular systolic function is normal. The right ventricular  size is normal. There is normal pulmonary artery systolic pressure.   3. The mitral valve is normal in structure. No evidence of mitral valve  regurgitation. No evidence of mitral stenosis.   4. The aortic valve is  tricuspid. Aortic valve regurgitation is not  visualized.   5. The inferior vena cava is normal in size with greater than 50%  respiratory variability, suggesting right atrial pressure of 3 mmHg.      Neuro/Psych  Headaches PSYCHIATRIC DISORDERS Anxiety Depression       GI/Hepatic Neg liver ROS,GERD  Controlled and Medicated,,  Endo/Other  negative endocrine ROS    Renal/GU negative Renal ROS  negative genitourinary   Musculoskeletal  (+)  Fibromyalgia -  Abdominal   Peds  Hematology negative hematology ROS (+)   Anesthesia Other Findings   Reproductive/Obstetrics negative OB ROS                              Anesthesia Physical Anesthesia Plan  ASA: 3  Anesthesia Plan: General   Post-op Pain Management: Tylenol PO (pre-op)*   Induction: Intravenous  PONV Risk Score and Plan: 4 or greater and Ondansetron, Dexamethasone, Midazolam, Scopolamine patch - Pre-op, Treatment may vary due to age or medical condition, Propofol infusion and TIVA  Airway Management Planned: LMA  Additional Equipment: None  Intra-op Plan:   Post-operative Plan: Extubation in OR  Informed Consent: I have reviewed the patients History and Physical, chart, labs and discussed the procedure including the risks, benefits and alternatives for the proposed anesthesia with the patient or authorized representative who has indicated his/her understanding and acceptance.     Dental advisory given  Plan Discussed with: CRNA  Anesthesia Plan Comments:          Anesthesia Quick Evaluation

## 2023-06-13 NOTE — Anesthesia Procedure Notes (Signed)
Procedure Name: LMA Insertion Date/Time: 06/13/2023 12:36 PM  Performed by: Burna Cash, CRNAPre-anesthesia Checklist: Patient identified, Emergency Drugs available, Suction available and Patient being monitored Patient Re-evaluated:Patient Re-evaluated prior to induction Oxygen Delivery Method: Circle system utilized Preoxygenation: Pre-oxygenation with 100% oxygen Induction Type: IV induction Ventilation: Mask ventilation without difficulty LMA: LMA inserted LMA Size: 4.0 Number of attempts: 1 Airway Equipment and Method: Bite block Placement Confirmation: positive ETCO2 Tube secured with: Tape Dental Injury: Teeth and Oropharynx as per pre-operative assessment

## 2023-06-13 NOTE — Op Note (Signed)
EXCISION OF RIGHT  HIDRADENITIS AXILLA  Operative Note (CSN: [id number])  Service  Date of Surgery: 06/13/2023 Admit Date: 06/13/2023 Performing Service: General Surgeons and Role:    Lysle Rubens, MD - Primary  Op Note Pre-op Diagnosis: HIDRADENITIS Post-op Diagnosis:Same  Procedure(s): EXCISION OF RIGHT  HIDRADENITIS AXILLA  Findings: No erythema or inflammation. Some thickened dermal layers noted at previous remote surgical incision in right axilla.  Anesthesia: General Estimated Blood Loss: 5 mL  Complications: None Specimens:  ID Type Source Tests Collected by Time Destination  1 : right axillary hidradenitis Tissue PATH Other SURGICAL PATHOLOGY Lysle Rubens, MD 06/13/2023 1252     Brief history / Indications for Surgery: History of hidradenitis s/p excision in 2017 with recurrent infections over last few years after 2 years of remission. In clinic no active abscess but erythema and induration. Today no erythema noted but able to palpate thickened cord like skin near previous surgical incision.  Procedure Details  Prior to the procedure, the risks, benefits, complications, treatment options, and expected outcomes were discussed with the patient and/or family, including but not limited to, the risks of bleeding, infection, and recurrence as well as incomplete excision.  Despite the risks, the patient has given informed consent for operative intervention.  The patient was taken to the Operating Room, identified as Gina Espinoza and the procedure verified as excision of right axillary hidradenitis.  Identification pause was held and the above information confirmed.  The patient was placed in the supine position with right arm out and General LMA anesthesia was induced. The right axilla was prepped with chloraprep and draped in the typical sterile fashion.  A formal preincision time out was performed.  The previous axillary incision was excised using a #15 blade and  electrocautery. No active signs of infection noted. No notable cyst or lesion noted. Dermal thickening present all along prior surgical site so elliptical incision enlarged to include areas of dermal thickening. Using hemostat and finger, area probed and palpated to see if any additional areas of concern. Unable to appreciate any further thickened layers or cavities.  Area was irrigated and hemostasis achieved. Local anesthestic injected into wound bed. Wound closed in layers using 3-0 vicryl to bring together the subcutaneous and deep dermal layers and 4-0 monocryl used to close skin. Dermabond applied.  Instrument, sponge, and needle counts were correct at the conclusion of the case.    Lysle Rubens, MD Date: 06/13/2023  Time: 1:20 PM

## 2023-06-14 ENCOUNTER — Encounter (HOSPITAL_BASED_OUTPATIENT_CLINIC_OR_DEPARTMENT_OTHER): Payer: Self-pay | Admitting: General Surgery

## 2023-06-14 NOTE — Anesthesia Postprocedure Evaluation (Signed)
Anesthesia Post Note  Patient: Gina Espinoza  Procedure(s) Performed: EXCISION OF RIGHT  HIDRADENITIS AXILLA (Right: Axilla)     Patient location during evaluation: PACU Anesthesia Type: General Level of consciousness: awake and alert, oriented and patient cooperative Pain management: pain level controlled Vital Signs Assessment: post-procedure vital signs reviewed and stable Respiratory status: spontaneous breathing, nonlabored ventilation and respiratory function stable Cardiovascular status: blood pressure returned to baseline and stable Postop Assessment: no apparent nausea or vomiting Anesthetic complications: no   No notable events documented.  Last Vitals:  Vitals:   06/13/23 1500 06/13/23 1515  BP: (!) 141/80 139/85  Pulse: 75 69  Resp: 15 16  Temp:  (!) 36.3 C  SpO2: 95% 94%    Last Pain:  Vitals:   06/13/23 1515  TempSrc:   PainSc: 7                  Lannie Fields

## 2023-06-14 NOTE — Transfer of Care (Signed)
Immediate Anesthesia Transfer of Care Note  Patient: Gina Espinoza  Procedure(s) Performed: EXCISION OF RIGHT  HIDRADENITIS AXILLA (Right: Axilla)  Patient Location: PACU  Anesthesia Type:General  Level of Consciousness: sedated  Airway & Oxygen Therapy: Patient Spontanous Breathing and Patient connected to face mask oxygen  Post-op Assessment: Report given to RN and Post -op Vital signs reviewed and stable  Post vital signs: Reviewed and stable  Last Vitals:  Vitals Value Taken Time  BP 139/85 06/13/23 1515  Temp 36.3 C 06/13/23 1515  Pulse 69 06/13/23 1515  Resp 16 06/13/23 1515  SpO2 94 % 06/13/23 1515    Last Pain:  Vitals:   06/13/23 1515  TempSrc:   PainSc: 7       Patients Stated Pain Goal: 3 (06/13/23 1515)  Complications: No notable events documented.

## 2023-06-18 LAB — SURGICAL PATHOLOGY

## 2023-06-21 ENCOUNTER — Ambulatory Visit: Payer: 59 | Attending: Cardiology | Admitting: Cardiology

## 2023-06-21 VITALS — BP 106/70 | HR 62 | Ht 66.0 in | Wt 164.2 lb

## 2023-06-21 DIAGNOSIS — I201 Angina pectoris with documented spasm: Secondary | ICD-10-CM

## 2023-06-21 DIAGNOSIS — I10 Essential (primary) hypertension: Secondary | ICD-10-CM | POA: Diagnosis not present

## 2023-06-21 NOTE — Patient Instructions (Signed)

## 2023-06-21 NOTE — Progress Notes (Signed)
Cardiology Office Note   Date:  06/21/2023   ID:  Gina, Espinoza 08/23/1960, MRN [id number]  PCP:  Ronnald Nian, MD  Cardiologist:   Izel Eisenhardt [place], MD   Chief Complaint  Patient presents with   coronary vasospasm      History of Present Illness: Gina Espinoza is a 63 y.o. female who is seen for follow up vasospastic CAD.  She has a history of HLD and HTN. She has a long history of coronary vasospasm. In 2006 had an abnormal stress test with inferolateral ST depression. This led to a cardiac cath showing normal coronary arteries. She was treated for coronary spasm with amlodipine and sl Ntg.   In 2022 a  Myoview study showed normal perfusion and EF 46%. By Echo EF was normal.   On follow up today she is doing very well. Stays busy with her grandson. Denies any chest pain. Had minor surgery recently.   Past Medical History:  Diagnosis Date   Anxiety    Chest pain, unspecified    Cough    Depression    Fibromyalgia    GERD (gastroesophageal reflux disease)    Headache(784.0)    Other and unspecified hyperlipidemia    mixed   Personal history of unspecified circulatory disease    PONV (postoperative nausea and vomiting)    Unspecified essential hypertension     Past Surgical History:  Procedure Laterality Date   anterior perineoplasty     posterior repair. 05/16/02   BLADDER SUSPENSION     CARDIAC CATHETERIZATION  2006   normal coronaries   ESOPHAGOGASTRODUODENOSCOPY N/A 02/09/2013   Procedure: ESOPHAGOGASTRODUODENOSCOPY (EGD);  Surgeon: Rachael Fee, MD;  Location: [facility] ENDOSCOPY;  Service: Endoscopy;  Laterality: N/A;   excision sebaceous cyst  11/2013   posterior scalp   HEMATOMA EVACUATION     vaginal hematoma 05/23/02   HYDRADENITIS EXCISION Right 06/13/2023   Procedure: EXCISION OF RIGHT  HIDRADENITIS AXILLA;  Surgeon: Lysle Rubens, MD;  Location: [facility];  Service: General;  Laterality: Right;  LMA   shoulder surgery      sebacceous cyst removal   UTERINE SUSPENSION       Current Outpatient Medications  Medication Sig Dispense Refill   amLODipine (NORVASC) 2.5 MG tablet TAKE 1 TABLET BY MOUTH EVERY DAY 90 tablet 3   azelastine (OPTIVAR) 0.05 % ophthalmic solution Apply 1 drop to eye 2 (two) times daily. 6 mL 12   Azelastine HCl 137 MCG/SPRAY SOLN Place 2 sprays into both nostrils in the morning and at bedtime. 90 mL 3   budesonide (RHINOCORT AQUA) 32 MCG/ACT nasal spray Place 2 sprays into both nostrils daily. 3 g 3   Calcium-Magnesium-Zinc (CAL-MAG-ZINC PO) Take by mouth.     diclofenac sodium (VOLTAREN) 1 % GEL 3 grams to 3 large joints up to 3 times daily 3 Tube 3   erythromycin ophthalmic ointment 1 application  as needed.     gabapentin (NEURONTIN) 300 MG capsule Take 1 capsule (300 mg total) by mouth 2 (two) times daily. Take 1 tablet in the morning and 1 tablet at night 60 capsule 0   isosorbide mononitrate (IMDUR) 30 MG 24 hr tablet TAKE 1 TABLET BY MOUTH EVERY DAY 90 tablet 3   levocetirizine (XYZAL) 5 MG tablet Take 1 tablet (5 mg total) by mouth every evening. 90 tablet 3   linaclotide (LINZESS) 145 MCG CAPS capsule TAKE 1 CAPSULE BY MOUTH DAILY BEFORE BREAKFAST. 90 capsule  3   linaclotide (LINZESS) 290 MCG CAPS capsule Take 1 capsule (290 mcg total) by mouth daily before breakfast. 30 capsule 6   methocarbamol (ROBAXIN) 750 MG tablet Take 750 mg by mouth 4 (four) times daily.     mirabegron ER (MYRBETRIQ) 25 MG TB24 tablet Take 25 mg by mouth as needed.     montelukast (SINGULAIR) 10 MG tablet Take 1 tablet (10 mg total) by mouth at bedtime. 90 tablet 3   Multiple Vitamin (MULTIVITAMIN WITH MINERALS) TABS tablet Take 1 tablet by mouth daily.     pantoprazole (PROTONIX) 40 MG tablet TAKE 1 TABLET BY MOUTH EVERY DAY AT 6PM 90 tablet 3   polyethylene glycol (MIRALAX / GLYCOLAX) packet Take 17 g by mouth 2 (two) times daily. 14 each 0   rosuvastatin (CRESTOR) 5 MG tablet Take 1 tablet (5 mg total) by  mouth daily. 90 tablet 3   Sod Fluoride-Potassium Nitrate 1.1-5 % PSTE      spironolactone (ALDACTONE) 100 MG tablet Take 100 mg by mouth as needed.     Vitamin D, Ergocalciferol, (DRISDOL) 1.25 MG (50000 UNIT) CAPS capsule Take 1 capsule by mouth once a week.     EPINEPHrine 0.3 mg/0.3 mL IJ SOAJ injection Inject 0.3 mg into the muscle as needed. (Patient not taking: Reported on 06/21/2023)     nitroGLYCERIN (NITROSTAT) 0.4 MG SL tablet PLACE 1 TABLET UNDER THE TONGUE EVERY 5 MINUTES AS NEEDED FOR CHEST PAIN. (Patient not taking: Reported on 06/21/2023) 75 tablet 0   No current facility-administered medications for this visit.    Allergies:   Bee venom, Codeine, Milnacipran, Savella [milnacipran hcl], Amitiza [lubiprostone], and Ivp dye [iodinated contrast media]    Social History:  The patient  reports that she has never smoked. She has never used smokeless tobacco. She reports that she does not drink alcohol and does not use drugs.   Family History:  The patient's family history includes Allergies in her mother; Asthma in her daughter and grandchild; Breast cancer in her maternal grandmother; Colon cancer in her brother; Heart disease in her father.    ROS:  Please see the history of present illness.   Otherwise, review of systems are positive for none.   All other systems are reviewed and negative.    PHYSICAL EXAM: VS:  BP 106/70 (BP Location: Left Arm, Patient Position: Sitting, Cuff Size: Normal)   Pulse 62   Ht 5\' 6"  (1.676 m)   Wt 164 lb 3.2 oz (74.5 kg)   LMP  (LMP Unknown)   SpO2 98%   BMI 26.50 kg/m  , BMI Body mass index is 26.5 kg/m. GEN: Well nourished, well developed, in no acute distress  HEENT: normal  Neck: no JVD, carotid bruits, or masses Cardiac: RRR; no murmurs, rubs, or gallops,no edema  Respiratory:  clear to auscultation  Neuro:  Strength and sensation are intact    EKG:  EKG is not ordered today. The ekg ordered 06/08/23 showed NSR rate 60 with  nonspecific TWA. I have personally reviewed and interpreted this study.     Recent Labs: 09/29/2022: ALT 13; Hemoglobin 12.5; Platelets 318 06/08/2023: BUN 12; Creatinine, Ser 0.95; Potassium 4.0; Sodium 139    Lipid Panel    Component Value Date/Time   CHOL 181 10/24/2022 1538   TRIG 122 10/24/2022 1538   HDL 58 10/24/2022 1538   CHOLHDL 3.1 10/24/2022 1538   CHOLHDL 3.0 08/25/2016 0932   VLDL 18 08/25/2016 0932   LDLCALC 101 (  H) 10/24/2022 1538   LDLDIRECT 161.1 12/17/2008 0901      Wt Readings from Last 3 Encounters:  06/21/23 164 lb 3.2 oz (74.5 kg)  06/13/23 164 lb 10.9 oz (74.7 kg)  03/29/23 165 lb 3.2 oz (74.9 kg)      Other studies Reviewed: Additional studies/ records that were reviewed today include: remote cardiac cath report 2006.Marland Kitchen Review of the above records demonstrates: normal coronary arteries  Myoview 11/18/20: Study Highlights    Nuclear stress EF: 46%. The left ventricular ejection fraction is mildly decreased (45-54%). T wave inversion was noted during stress in the II, III, aVF, V4, V5, V6, V3 and V2 leads (more prominent than baseline TWI) that resolved in the post-infusion period. May represent episode of vasospasm given patient's history. Normal myocardial perfusion with no evidence of ischemia or infarction. This is an intermediate risk study due to mildly reduced LVEF. Recommend TTE for further evaluation.   Laurance Flatten, MD   Echo 12/15/20: IMPRESSIONS     1. Left ventricular ejection fraction, by estimation, is 60 to 65%. Left  ventricular ejection fraction by 3D volume is 59 %. The left ventricle has  normal function. The left ventricle has no regional wall motion  abnormalities. Left ventricular diastolic   parameters were normal. The average left ventricular global longitudinal  strain is -22.7 %. The global longitudinal strain is normal.   2. Right ventricular systolic function is normal. The right ventricular  size is normal.  There is normal pulmonary artery systolic pressure.   3. The mitral valve is normal in structure. No evidence of mitral valve  regurgitation. No evidence of mitral stenosis.   4. The aortic valve is tricuspid. Aortic valve regurgitation is not  visualized.   5. The inferior vena cava is normal in size with greater than 50%  respiratory variability, suggesting right atrial pressure of 3 mmHg.   Conclusion(s)/Recommendation(s): Otherwise normal echocardiogram, with  minor abnormalities described in the report.   ASSESSMENT AND PLAN:  1.  Vasospastic coronary artery disease. Doing well on Imdur and amlodipine. Continue same. Follow up one year 2.  HTN controlled.   Current medicines are reviewed at length with the patient today.  The patient does not have concerns regarding medicines.  The following changes have been made:  no change  Labs/ tests ordered today include:      Disposition:   FU with me one year  Signed, Aaima Gaddie [place], MD  06/21/2023 1:35 PM    [facility] [address] [place], [place], [address] Phone [phone], Fax [phone]

## 2023-06-24 ENCOUNTER — Other Ambulatory Visit: Payer: Self-pay | Admitting: Medical

## 2023-06-25 DIAGNOSIS — M25552 Pain in left hip: Secondary | ICD-10-CM | POA: Diagnosis not present

## 2023-06-25 DIAGNOSIS — M25522 Pain in left elbow: Secondary | ICD-10-CM | POA: Diagnosis not present

## 2023-07-17 ENCOUNTER — Encounter: Payer: Self-pay | Admitting: Internal Medicine

## 2023-07-26 ENCOUNTER — Other Ambulatory Visit: Payer: BC Managed Care – PPO

## 2023-09-21 ENCOUNTER — Other Ambulatory Visit: Payer: Self-pay | Admitting: Family Medicine

## 2023-09-21 DIAGNOSIS — E785 Hyperlipidemia, unspecified: Secondary | ICD-10-CM

## 2023-10-12 DIAGNOSIS — L732 Hidradenitis suppurativa: Secondary | ICD-10-CM | POA: Diagnosis not present

## 2023-10-13 ENCOUNTER — Other Ambulatory Visit: Payer: Self-pay | Admitting: Family Medicine

## 2023-10-13 DIAGNOSIS — I10 Essential (primary) hypertension: Secondary | ICD-10-CM

## 2023-10-13 DIAGNOSIS — J302 Other seasonal allergic rhinitis: Secondary | ICD-10-CM

## 2023-10-13 DIAGNOSIS — J309 Allergic rhinitis, unspecified: Secondary | ICD-10-CM

## 2023-10-13 DIAGNOSIS — I201 Angina pectoris with documented spasm: Secondary | ICD-10-CM

## 2023-10-16 ENCOUNTER — Other Ambulatory Visit: Payer: Self-pay | Admitting: Family Medicine

## 2023-10-16 DIAGNOSIS — J302 Other seasonal allergic rhinitis: Secondary | ICD-10-CM

## 2023-10-16 DIAGNOSIS — E785 Hyperlipidemia, unspecified: Secondary | ICD-10-CM

## 2023-10-16 DIAGNOSIS — I10 Essential (primary) hypertension: Secondary | ICD-10-CM

## 2023-10-16 DIAGNOSIS — J309 Allergic rhinitis, unspecified: Secondary | ICD-10-CM

## 2023-10-16 NOTE — Telephone Encounter (Signed)
 Copied from CRM [id number]. Topic: Clinical - Medication Refill >> Oct 16, 2023 10:03 AM Earnestine Goes B wrote: Medication: amLODipine  (NORVASC ) 2.5 MG tablet, levocetirizine (XYZAL ) 5 MG tablet . rosuvastatin  (CRESTOR ) 5 MG tablet, Azelastine  HCl 137 MCG/SPRAY SOLN , azelastine  (OPTIVAR ) 0.05 % ophthalmic solution  Has the patient contacted their pharmacy? Yes (Agent: If no, request that the patient contact the pharmacy for the refill. If patient does not wish to contact the pharmacy document the reason why and proceed with request.) (Agent: If yes, when and what did the pharmacy advise?)  This is the patient's preferred pharmacy:  [id number] - [place], [place] - [address] AT [facility] [address] [place] [address] Phone: [phone] Fax: [phone]  Is this the correct pharmacy for this prescription? Yes If no, delete pharmacy and type the correct one.   Has the prescription been filled recently? Yes  Is the patient out of the medication? Yes  Has the patient been seen for an appointment in the last year OR does the patient have an upcoming appointment? Yes  Can we respond through MyChart? Yes  Agent: Please be advised that Rx refills may take up to 3 business days. We ask that you follow-up with your pharmacy.

## 2023-10-17 MED ORDER — AZELASTINE HCL 0.05 % OP SOLN: 1.0000 [drp] | Freq: Two times a day (BID) | OPHTHALMIC | 12 refills | Status: DC

## 2023-10-17 MED ORDER — AZELASTINE HCL 137 MCG/SPRAY NA SOLN: 2.0000 | Freq: Two times a day (BID) | NASAL | 3 refills | Status: DC

## 2023-10-17 MED ORDER — ROSUVASTATIN CALCIUM 5 MG PO TABS: 5.0000 mg | ORAL_TABLET | Freq: Every day | ORAL | 3 refills | Status: AC

## 2023-10-17 NOTE — Telephone Encounter (Signed)
 Per chart review, amlodipine  and levocetirizine were refilled yesterday 5/27.

## 2023-10-25 ENCOUNTER — Encounter: Payer: Self-pay | Admitting: Family Medicine

## 2023-10-25 ENCOUNTER — Ambulatory Visit (INDEPENDENT_AMBULATORY_CARE_PROVIDER_SITE_OTHER): Payer: BC Managed Care – PPO | Admitting: Family Medicine

## 2023-10-25 VITALS — BP 128/74 | HR 58 | Ht 66.0 in | Wt 167.0 lb

## 2023-10-25 DIAGNOSIS — I10 Essential (primary) hypertension: Secondary | ICD-10-CM

## 2023-10-25 DIAGNOSIS — K5900 Constipation, unspecified: Secondary | ICD-10-CM

## 2023-10-25 DIAGNOSIS — R7303 Prediabetes: Secondary | ICD-10-CM

## 2023-10-25 DIAGNOSIS — N393 Stress incontinence (female) (male): Secondary | ICD-10-CM

## 2023-10-25 DIAGNOSIS — F325 Major depressive disorder, single episode, in full remission: Secondary | ICD-10-CM

## 2023-10-25 DIAGNOSIS — E785 Hyperlipidemia, unspecified: Secondary | ICD-10-CM

## 2023-10-25 DIAGNOSIS — M797 Fibromyalgia: Secondary | ICD-10-CM

## 2023-10-25 DIAGNOSIS — M8580 Other specified disorders of bone density and structure, unspecified site: Secondary | ICD-10-CM

## 2023-10-25 DIAGNOSIS — J302 Other seasonal allergic rhinitis: Secondary | ICD-10-CM | POA: Diagnosis not present

## 2023-10-25 DIAGNOSIS — Z0000 Encounter for general adult medical examination without abnormal findings: Secondary | ICD-10-CM | POA: Diagnosis not present

## 2023-10-25 DIAGNOSIS — N3281 Overactive bladder: Secondary | ICD-10-CM | POA: Diagnosis not present

## 2023-10-25 DIAGNOSIS — Z8742 Personal history of other diseases of the female genital tract: Secondary | ICD-10-CM | POA: Diagnosis not present

## 2023-10-25 DIAGNOSIS — Z23 Encounter for immunization: Secondary | ICD-10-CM | POA: Diagnosis not present

## 2023-10-25 DIAGNOSIS — G894 Chronic pain syndrome: Secondary | ICD-10-CM

## 2023-10-25 DIAGNOSIS — K219 Gastro-esophageal reflux disease without esophagitis: Secondary | ICD-10-CM

## 2023-10-25 DIAGNOSIS — Z560 Unemployment, unspecified: Secondary | ICD-10-CM

## 2023-10-25 DIAGNOSIS — J309 Allergic rhinitis, unspecified: Secondary | ICD-10-CM

## 2023-10-25 LAB — POCT GLYCOSYLATED HEMOGLOBIN (HGB A1C): Hemoglobin A1C: 6.1 % — AB (ref 4.0–5.6)

## 2023-10-25 MED ORDER — BUDESONIDE 32 MCG/ACT NA SUSP
2.0000 | Freq: Every day | NASAL | 3 refills | Status: AC
Start: 2023-10-25 — End: ?

## 2023-10-25 MED ORDER — AZELASTINE HCL 0.05 % OP SOLN: 1.0000 [drp] | Freq: Two times a day (BID) | OPHTHALMIC | 12 refills | Status: AC

## 2023-10-25 MED ORDER — PANTOPRAZOLE SODIUM 40 MG PO TBEC: DELAYED_RELEASE_TABLET | ORAL | 3 refills | Status: AC

## 2023-10-25 MED ORDER — MONTELUKAST SODIUM 10 MG PO TABS: 10.0000 mg | ORAL_TABLET | Freq: Every day | ORAL | 3 refills | Status: AC

## 2023-10-25 MED ORDER — GABAPENTIN 300 MG PO CAPS: 300.0000 mg | ORAL_CAPSULE | Freq: Two times a day (BID) | ORAL | 0 refills | Status: DC

## 2023-10-25 MED ORDER — LINACLOTIDE 290 MCG PO CAPS: 290.0000 ug | ORAL_CAPSULE | Freq: Every day | ORAL | 6 refills | Status: AC

## 2023-10-25 MED ORDER — LINACLOTIDE 145 MCG PO CAPS: ORAL_CAPSULE | ORAL | 3 refills | Status: AC

## 2023-10-25 MED ORDER — AZELASTINE HCL 137 MCG/SPRAY NA SOLN: 2.0000 | Freq: Two times a day (BID) | NASAL | 3 refills | Status: AC

## 2023-10-25 NOTE — Patient Instructions (Signed)
  Gina Espinoza , Thank you for taking time to come for your Medicare Wellness Visit. I appreciate your ongoing commitment to your health goals. Please review the following plan we discussed and let me know if I can assist you in the future.   These are the goals we discussed: Leave a message on MyChart as to whether gabapentin  is working for your aches and pains.     This is a list of the screening recommended for you and due dates:  Health Maintenance  Topic Date Due   COVID-19 Vaccine (1) Never done   HIV Screening  Never done   Pneumococcal Vaccination (1 of 2 - PCV) Never done   Mammogram  02/09/2022   Pap with HPV screening  02/09/2023   Flu Shot  12/21/2023   Medicare Annual Wellness Visit  10/24/2024   Colon Cancer Screening  06/28/2027   DTaP/Tdap/Td vaccine (4 - Td or Tdap) 09/22/2028   Hepatitis C Screening  Completed   Zoster (Shingles) Vaccine  Completed   HPV Vaccine  Aged Out   Meningitis B Vaccine  Aged Out

## 2023-10-25 NOTE — Progress Notes (Signed)
 Gina Espinoza is a 63 y.o. female who presents for annual wellness visit,CPE and follow-up on chronic medical conditions.  She will be seeing her gynecologist for Pap and pelvic as well as mammogram.  She states that she got her bone density scan done at the gynecologist office.  She does have a history of fibromyalgia and is on multiple meds for this.  She is no longer on any chronic narcotics.  She did try gabapentin  but did not follow-up with me concerning this.  She states that she thinks it did help her with her aches and pains.  She also uses muscle relaxer.  She is on multiple medications for her allergies and is stable on all of them.  She also has a history of chronic constipation and is on relatively high dosing of Linzess  that she states her gastroenterologist approved.  She follows up periodically with cardiology.  She states that she is no longer depressed.  She is no longer having any difficulty with OAB the symptoms.  She is again difficulty with hidradenitis suppurativa and is scheduled for another procedure.  Immunizations and Health Maintenance Immunization History  Administered Date(s) Administered   Hepatitis B 03/10/2008, 04/10/2008, 09/08/2008   Influenza,inj,Quad PF,6+ Mos 02/11/2013, 06/18/2014, 06/08/2015, 02/24/2016, 02/20/2018   Influenza,inj,quad, With Preservative 03/17/2019   Influenza-Unspecified 03/29/2012, 02/19/2018   MMR 03/10/2008, 04/20/2008   Td 09/23/2018   Tdap 03/10/2008, 09/23/2018   Varicella 02/28/2008, 04/10/2008   Zoster Recombinant(Shingrix) 11/17/2016, 01/29/2017   Health Maintenance Due  Topic Date Due   COVID-19 Vaccine (1) Never done   HIV Screening  Never done   Pneumococcal Vaccine 42-34 Years old (1 of 2 - PCV) Never done   MAMMOGRAM  02/09/2022   Cervical Cancer Screening (HPV/Pap Cotest)  02/09/2023   Medicare Annual Wellness (AWV)  10/24/2023    Last Pap smear: September 2025 Last mammogram: September 2025  Last colonoscopy:  06/27/2017 Last DEXA: Dentist: may 2025 Ophtho: June 2025  Exercise: walking, bowling, etc.   Other doctors caring for patient include:Gina Espinoza Insurance account manager  Advanced directives: Does Patient Have a Medical Advance Directive?: No Would patient like information on creating a medical advance directive?: No - Patient declined  Depression screen:  See questionnaire below.     10/25/2023    2:02 PM 10/24/2022    2:59 PM 10/12/2021    2:10 PM 09/28/2020    8:40 AM 09/28/2020    8:39 AM  Depression screen PHQ 2/9  Decreased Interest 0 0 0 0 0  Down, Depressed, Hopeless 0 0 0 0 0  PHQ - 2 Score 0 0 0 0 0  Altered sleeping  2 3 3 3   Tired, decreased energy  1 0 2 2  Change in appetite  0 1 2 2   Feeling bad or failure about yourself   0 0 0 0  Trouble concentrating  0 0 0 0  Moving slowly or fidgety/restless  0 0 0 0  Suicidal thoughts  0 0 0 0  PHQ-9 Score  3 4 7 7   Difficult doing work/chores  Not difficult at all Not difficult at all Not difficult at all Not difficult at all    Fall Risk Screen: see questionnaire below.    10/25/2023    2:02 PM 10/24/2022    2:58 PM 10/12/2021    1:58 PM 09/28/2020    8:39 AM 09/28/2020    8:38 AM  Fall Risk   Falls in the past year? 0 1 1  1 1  Number falls in past yr: 0 0 1 1 1   Injury with Fall? 0 0 0 1 1  Risk for fall due to : No Fall Risks No Fall Risks History of fall(s) Other (Comment) Other (Comment)  Follow up Falls evaluation completed Falls evaluation completed Falls evaluation completed Falls evaluation completed Falls evaluation completed    ADL screen:  See questionnaire below Functional Status Survey: Is the patient deaf or have difficulty hearing?: No Does the patient have difficulty seeing, even when wearing glasses/contacts?: Yes Does the patient have difficulty concentrating, remembering, or making decisions?: Yes Does the patient have difficulty walking or climbing stairs?: Yes Does the patient have difficulty dressing  or bathing?: No Does the patient have difficulty doing errands alone such as visiting a doctor's office or shopping?: No   Review of Systems Constitutional: -, -unexpected weight change, -anorexia, -fatigue Allergy: -sneezing, -itching, -congestion Dermatology: denies changing moles, rash, lumps ENT: -runny nose, -ear pain, -sore throat,  Cardiology:  -chest pain, -palpitations, -orthopnea, Respiratory: -cough, -shortness of breath, -dyspnea on exertion, -wheezing,  Gastroenterology: -abdominal pain, -nausea, -vomiting, -diarrhea, -constipation, -dysphagia Hematology: -bleeding or bruising problems Musculoskeletal: -arthralgias, -myalgias, -joint swelling, -back pain, - Ophthalmology: -vision changes,  Urology: -dysuria, -difficulty urinating,  -urinary frequency, -urgency, incontinence Neurology: -, -numbness, , -memory loss, -falls, -dizziness    PHYSICAL EXAM:  LMP  (LMP Unknown)   General Appearance: Alert, cooperative, no distress, appears stated age Head: Normocephalic, without obvious abnormality, atraumatic Eyes: PERRL, conjunctiva/corneas clear, EOM's intact,  Ears: Normal TM's and external ear canals Nose: Nares normal, mucosa normal, no drainage or sinus tenderness Throat: Lips, mucosa, and tongue normal; teeth and gums normal Neck: Supple, no lymphadenopathy;  thyroid :  no enlargement/tenderness/nodules; no carotid bruit or JVD Lungs: Clear to auscultation bilaterally without wheezes, rales or ronchi; respirations unlabored Heart: Regular rate and rhythm, S1 and S2 normal, no murmur, rubor gallop Skin:  Skin color, texture, turgor normal, no rashes or lesions Lymph nodes: Cervical, supraclavicular, and axillary nodes normal Neurologic:  CNII-XII intact, normal strength, sensation and gait; reflexes 2+ and symmetric throughout Psych: Normal mood, affect, hygiene and grooming. Hemoglobin A1c is 6.1 ASSESSMENT/PLAN: Routine general medical examination at a health care  facility  Osteopenia, unspecified location  Not currently working due to disabled status  OAB (overactive bladder)  Hyperlipidemia LDL goal <100  History of abnormal cervical Pap smear  Constipation, unspecified constipation type - Plan: linaclotide  (LINZESS ) 145 MCG CAPS capsule, linaclotide  (LINZESS ) 290 MCG CAPS capsule  Chronic seasonal allergic rhinitis - Plan: azelastine  (OPTIVAR ) 0.05 % ophthalmic solution, Azelastine  HCl 137 MCG/SPRAY SOLN, budesonide  (RHINOCORT  AQUA) 32 MCG/ACT nasal spray, montelukast  (SINGULAIR ) 10 MG tablet  Chronic pain syndrome - Plan: gabapentin  (NEURONTIN ) 300 MG capsule  Fibromyalgia syndrome  Essential hypertension  Depression, major, in remission (HCC)  Allergic rhinitis, unspecified seasonality, unspecified trigger - Plan: Azelastine  HCl 137 MCG/SPRAY SOLN, montelukast  (SINGULAIR ) 10 MG tablet  Gastroesophageal reflux disease without esophagitis - Plan: pantoprazole  (PROTONIX ) 40 MG tablet  Pre-diabetes - Plan: POCT glycosylated hemoglobin (Hb A1C)  Need for vaccination against Streptococcus pneumoniae - Plan: Pneumococcal conjugate vaccine 20-valent (Prevnar 20)    Discussed  healthy diet, including goals of calcium  and vitamin D  intake   Immunization recommendations discussed.  Colonoscopy recommendations reviewed   Medicare Attestation I have personally reviewed: The patient's medical and social history Their use of alcohol, tobacco or illicit drugs Their current medications and supplements The patient's functional ability including ADLs,fall risks, home safety risks,  cognitive, and hearing and visual impairment Diet and physical activities Evidence for depression or mood disorders  The patient's weight, height, and BMI have been recorded in the chart.  I have made referrals, counseling, and provided education to the patient based on review of the above and I have provided the patient with a written personalized care plan for  preventive services.     Ron Cobbs, MD   10/25/2023

## 2023-10-31 DIAGNOSIS — H2513 Age-related nuclear cataract, bilateral: Secondary | ICD-10-CM | POA: Diagnosis not present

## 2023-10-31 DIAGNOSIS — H35033 Hypertensive retinopathy, bilateral: Secondary | ICD-10-CM | POA: Diagnosis not present

## 2023-10-31 DIAGNOSIS — I10 Essential (primary) hypertension: Secondary | ICD-10-CM | POA: Diagnosis not present

## 2023-11-08 ENCOUNTER — Ambulatory Visit: Payer: Self-pay | Admitting: General Surgery

## 2023-11-08 NOTE — Progress Notes (Signed)
 Surgical Instructions   Your procedure is scheduled on Friday November 16, 2023. Report to [facility] Main Entrance A at 10:45 A.M., then check in with the Admitting office. Any questions or running late day of surgery: call [phone]  Questions prior to your surgery date: call [phone], Monday-Friday, 8am-4pm. If you experience any cold or flu symptoms such as cough, fever, chills, shortness of breath, etc. between now and your scheduled surgery, please notify us  at the above number.     Remember:  Do not eat after midnight the night before your surgery   You may drink clear liquids until 9:45 the morning of your surgery.   Clear liquids allowed are: Water, Non-Citrus Juices (without pulp), Carbonated Beverages, Clear Tea (no milk, honey, etc.), Black Coffee Only (NO MILK, CREAM OR POWDERED CREAMER of any kind), and Gatorade.    Take these medicines the morning of surgery with A SIP OF WATER  amLODipine  (NORVASC )  azelastine  (OPTIVAR ) 0.05 % ophthalmic solution  gabapentin  (NEURONTIN )  isosorbide  mononitrate (IMDUR )  rosuvastatin  (CRESTOR )   May take these medicines IF NEEDED: Azelastine  HCl 137 MCG/SPRAY SOLN  baclofen (LIORESAL)  budesonide  (RHINOCORT  AQUA) 32 MCG/ACT nasal spray  hydrOXYzine (ATARAX)  methocarbamol (ROBAXIN)  nitroGLYCERIN  (NITROSTAT ) If you have to take this medication prior to surgery, please call [phone] and report this to a nurse olopatadine  (PATADAY ) 0.1 % ophthalmic solution  pantoprazole  (PROTONIX )   One week prior to surgery, STOP taking any Aspirin (unless otherwise instructed by your surgeon) Aleve, Naproxen, Ibuprofen , Motrin , Advil , Goody's, BC's, all herbal medications, fish oil, and non-prescription vitamins.  This includes your diclofenac  sodium (VOLTAREN ) 1 % GEL.                       Do NOT Smoke (Tobacco/Vaping) for 24 hours prior to your procedure.  If you use a CPAP at night, you may bring your mask/headgear for your  overnight stay.   You will be asked to remove any contacts, glasses, piercing's, hearing aid's, dentures/partials prior to surgery. Please bring cases for these items if needed.    Patients discharged the day of surgery will not be allowed to drive home, and someone needs to stay with them for 24 hours.  SURGICAL WAITING ROOM VISITATION Patients may have no more than 2 support people in the waiting area - these visitors may rotate.   Pre-op nurse will coordinate an appropriate time for 1 ADULT support person, who may not rotate, to accompany patient in pre-op.  Children under the age of 34 must have an adult with them who is not the patient and must remain in the main waiting area with an adult.  If the patient needs to stay at the hospital during part of their recovery, the visitor guidelines for inpatient rooms apply.  Please refer to the [facility] website for the visitor guidelines for any additional information.   If you received a COVID test during your pre-op visit  it is requested that you wear a mask when out in public, stay away from anyone that may not be feeling well and notify your surgeon if you develop symptoms. If you have been in contact with anyone that has tested positive in the last 10 days please notify you surgeon.      Pre-operative CHG Bathing Instructions   You can play a key role in reducing the risk of infection after surgery. Your skin needs to be as free of germs as possible. You can  reduce the number of germs on your skin by washing with CHG (chlorhexidine  gluconate) soap before surgery. CHG is an antiseptic soap that kills germs and continues to kill germs even after washing.   DO NOT use if you have an allergy to chlorhexidine /CHG or antibacterial soaps. If your skin becomes reddened or irritated, stop using the CHG and notify one of our RNs at [phone].              TAKE A SHOWER THE NIGHT BEFORE SURGERY AND THE DAY OF SURGERY    Please keep in mind  the following:  DO NOT shave, including legs and underarms, 48 hours prior to surgery.   You may shave your face before/day of surgery.  Place clean sheets on your bed the night before surgery Use a clean washcloth (not used since being washed) for each shower. DO NOT sleep with pet's night before surgery.  CHG Shower Instructions:  Wash your face and private area with normal soap. If you choose to wash your hair, wash first with your normal shampoo.  After you use shampoo/soap, rinse your hair and body thoroughly to remove shampoo/soap residue.  Turn the water OFF and apply half the bottle of CHG soap to a CLEAN washcloth.  Apply CHG soap ONLY FROM YOUR NECK DOWN TO YOUR TOES (washing for 3-5 minutes)  DO NOT use CHG soap on face, private areas, open wounds, or sores.  Pay special attention to the area where your surgery is being performed.  If you are having back surgery, having someone wash your back for you may be helpful. Wait 2 minutes after CHG soap is applied, then you may rinse off the CHG soap.  Pat dry with a clean towel  Put on clean pajamas    Additional instructions for the day of surgery: DO NOT APPLY any lotions, deodorants, cologne, or perfumes.   Do not wear jewelry or makeup Do not wear nail polish, gel polish, artificial nails, or any other type of covering on natural nails (fingers and toes) Do not bring valuables to the hospital. [facility] is not responsible for valuables/personal belongings. Put on clean/comfortable clothes.  Please brush your teeth.  Ask your nurse before applying any prescription medications to the skin.

## 2023-11-09 ENCOUNTER — Encounter (HOSPITAL_COMMUNITY): Payer: Self-pay

## 2023-11-09 ENCOUNTER — Other Ambulatory Visit: Payer: Self-pay

## 2023-11-09 ENCOUNTER — Encounter (HOSPITAL_COMMUNITY)
Admission: RE | Admit: 2023-11-09 | Discharge: 2023-11-09 | Disposition: A | Discharge status: Home / Self Care | Source: Ambulatory Visit | Attending: General Surgery | Admitting: General Surgery

## 2023-11-09 VITALS — BP 128/84 | HR 60 | Temp 98.3°F | Resp 16 | Ht 66.0 in | Wt 161.0 lb

## 2023-11-09 DIAGNOSIS — Z01818 Encounter for other preprocedural examination: Secondary | ICD-10-CM | POA: Diagnosis not present

## 2023-11-09 LAB — CBC
HCT: 38.4 % (ref 36.0–46.0)
Hemoglobin: 11.7 g/dL — ABNORMAL LOW (ref 12.0–15.0)
MCH: 24.6 pg — ABNORMAL LOW (ref 26.0–34.0)
MCHC: 30.5 g/dL (ref 30.0–36.0)
MCV: 80.7 fL (ref 80.0–100.0)
Platelets: 289 10*3/uL (ref 150–400)
RBC: 4.76 MIL/uL (ref 3.87–5.11)
RDW: 14.6 % (ref 11.5–15.5)
WBC: 5 10*3/uL (ref 4.0–10.5)
nRBC: 0 % (ref 0.0–0.2)

## 2023-11-09 LAB — BASIC METABOLIC PANEL WITH GFR
Anion gap: 7 (ref 5–15)
BUN: 10 mg/dL (ref 8–23)
CO2: 29 mmol/L (ref 22–32)
Calcium: 9.4 mg/dL (ref 8.9–10.3)
Chloride: 104 mmol/L (ref 98–111)
Creatinine, Ser: 0.84 mg/dL (ref 0.44–1.00)
GFR, Estimated: >60 mL/min (ref >60)
Glucose, Bld: 94 mg/dL (ref 70–99)
Potassium: 4.4 mmol/L (ref 3.5–5.1)
Sodium: 140 mmol/L (ref 135–145)

## 2023-11-09 NOTE — Progress Notes (Signed)
 PCP - Dr. Ron Cobbs Cardiologist - Dr. [place]  PPM/ICD - denies Device Orders - na Rep Notified - na  Chest x-ray - na EKG - 1/172025 Stress Test - 11/18/2020 ECHO - 12/15/2020 Cardiac Cath - 2006  Sleep Study - denies CPAP - na  Non-diabetic  Blood Thinner Instructions: denies Aspirin Instructions:denies  ERAS Protcol - Clears until 0945  Anesthesia review: No  Patient denies shortness of breath, fever, cough and chest pain at PAT appointment   All instructions explained to the patient, with a verbal understanding of the material. Patient agrees to go over the instructions while at home for a better understanding. Patient also instructed to self quarantine after being tested for COVID-19. The opportunity to ask questions was provided.

## 2023-11-15 NOTE — Progress Notes (Addendum)
 LVM with surgery time change for 11/16/23 [id number], arrival 0530, stop drinking clear liquids by 0430, stop eating solid food by midnight, and to follow all previous instructions given.

## 2023-11-15 NOTE — Progress Notes (Signed)
 Pt called back to express that she could not change her surgery time for 11/15/24 due having previous transportation arrangements. Pt request to keep her original surgery time. Pt advised that the OR desk will be notified of her requests. Will update pt.

## 2023-11-15 NOTE — H&P (Signed)
 HPI  Gina Espinoza is an 63 y.o. female who was seen in clinic on 10/12/23 for bilateral groin hidradenitis  Briefly, this is a patient who is s/p right axillary hidradenitis excision on 1/22. She states she did have a small area of the wound open several weeks after surgery and after her post-op appointment but this resolved. She does have nerve pain in her upper arm/axilla still.   She also has hidradenitis of the groin which she deferred evaluation of in prior visits, but presented back to clinic due to persistent/worsening symptoms of hidradenitis in the groin. She has difficulty wearing underwear and certain clothing because of its location in bilateral inguina creases. No drainage from lesions.   10 point review of systems is negative except as listed above in HPI.  Objective  Past Medical History: Past Medical History:  Diagnosis Date   Anxiety    Chest pain, unspecified    Cough    Depression    Fibromyalgia    GERD (gastroesophageal reflux disease)    Headache(784.0)    Other and unspecified hyperlipidemia    mixed   Personal history of unspecified circulatory disease    PONV (postoperative nausea and vomiting)    Unspecified essential hypertension     Past Surgical History: Past Surgical History:  Procedure Laterality Date   anterior perineoplasty     posterior repair. 05/16/02   BLADDER SUSPENSION     CARDIAC CATHETERIZATION  2006   normal coronaries   ESOPHAGOGASTRODUODENOSCOPY N/A 02/09/2013   Procedure: ESOPHAGOGASTRODUODENOSCOPY (EGD);  Surgeon: Toribio SHAUNNA Cedar, MD;  Location: [facility] END[facility]COPY;  Service: Endoscopy;  Laterality: N/A;   excision sebaceous cyst  11/2013   posterior scalp   HEMATOMA EVACUATION     vaginal hematoma 05/23/02   HYDRADENITIS EXCISION Right 06/13/2023   Procedure: EXCISION OF RIGHT  HIDRADENITIS AXILLA;  Surgeon: Ann Fine, MD;  Location: [facility];  Service: General;  Laterality: Right;  LMA   shoulder surgery      sebacceous cyst removal   UTERINE SUSPENSION      Family History:  Family History  Problem Relation Age of Onset   Allergies Mother        also children   Heart disease Father    Asthma Daughter    Breast cancer Maternal Grandmother    Colon cancer Brother    Asthma Grandchild     Social History:  reports that she has never smoked. She has never used smokeless tobacco. She reports that she does not drink alcohol and does not use drugs.  Allergies:  Allergies  Allergen Reactions   Bee Venom Swelling and Other (See Comments)    Other Reaction(s): edema   Codeine Hives and Itching    Other Reaction(s): Not available   Milnacipran Anaphylaxis   Savella [Milnacipran Hcl] Anaphylaxis   Amitiza  [Lubiprostone ]     Passed out   Ivp Dye [Iodinated Contrast Media] Hives and Itching    Medications: I have reviewed the patient's current medications.  Labs: Pertinent lab work personally reviewed.  Imaging: Pertinent imaging personally reviewed  Physical Exam There were no vitals taken for this visit. Gen: NAD CV: RRR Pulm: NWOB on room air Extr: R axillary incision well healed, nontender GU: Bilateral hidradenitis in inguinal creases. Tender to palpation bilaterally, R > L. No erythema. No drainable collections.     Assessment   Gina Espinoza is an 63 y.o. female with bilateral groin hidradenitis  Plan  -  Proceed to OR for excision of bilateral groin hidradenitis - We discussed risks of surgery including but not limited to: bleeding, infection, recurrence, wound dehiscence, seroma, hematoma, injury to surrounding structures. Patient adamant about having dissolvable sutures so as not to need suture removal. We discussed that this will have much higher risk of wound dehiscence that may require daily packing and dressing changes and is not my recommendation. However patient voiced understanding of this risk and wishes to proceed with buried absorbable suture.   Orie Silversmith, MD [facility]

## 2023-11-15 NOTE — Progress Notes (Signed)
 Pt made aware that she can come in on 11/15/24 at 1045 for surgery scheduled from 1245-1445, stop drinking clear liquids by 0945, stop eating solid food by midnight, and to follow all other previous instructions given.

## 2023-11-16 ENCOUNTER — Ambulatory Visit (HOSPITAL_COMMUNITY)

## 2023-11-16 ENCOUNTER — Other Ambulatory Visit: Payer: Self-pay

## 2023-11-16 ENCOUNTER — Encounter (HOSPITAL_COMMUNITY): Payer: Self-pay | Admitting: General Surgery

## 2023-11-16 ENCOUNTER — Ambulatory Visit (HOSPITAL_BASED_OUTPATIENT_CLINIC_OR_DEPARTMENT_OTHER)

## 2023-11-16 ENCOUNTER — Ambulatory Visit (HOSPITAL_COMMUNITY)
Admission: RE | Admit: 2023-11-16 | Discharge: 2023-11-16 | Disposition: A | Discharge status: Home / Self Care | Attending: General Surgery | Admitting: General Surgery

## 2023-11-16 ENCOUNTER — Encounter (HOSPITAL_COMMUNITY)
Admission: RE | Disposition: A | Discharge status: Home / Self Care | Payer: Self-pay | Source: Home / Self Care | Attending: General Surgery

## 2023-11-16 DIAGNOSIS — R519 Headache, unspecified: Secondary | ICD-10-CM | POA: Insufficient documentation

## 2023-11-16 DIAGNOSIS — L905 Scar conditions and fibrosis of skin: Secondary | ICD-10-CM | POA: Diagnosis not present

## 2023-11-16 DIAGNOSIS — L732 Hidradenitis suppurativa: Secondary | ICD-10-CM | POA: Insufficient documentation

## 2023-11-16 DIAGNOSIS — I10 Essential (primary) hypertension: Secondary | ICD-10-CM | POA: Insufficient documentation

## 2023-11-16 DIAGNOSIS — K219 Gastro-esophageal reflux disease without esophagitis: Secondary | ICD-10-CM | POA: Insufficient documentation

## 2023-11-16 DIAGNOSIS — F419 Anxiety disorder, unspecified: Secondary | ICD-10-CM | POA: Insufficient documentation

## 2023-11-16 DIAGNOSIS — F32A Depression, unspecified: Secondary | ICD-10-CM | POA: Diagnosis not present

## 2023-11-16 DIAGNOSIS — F418 Other specified anxiety disorders: Secondary | ICD-10-CM

## 2023-11-16 DIAGNOSIS — M797 Fibromyalgia: Secondary | ICD-10-CM | POA: Insufficient documentation

## 2023-11-16 HISTORY — PX: HYDRADENITIS EXCISION: SHX5243

## 2023-11-16 SURGERY — EXCISION, HIDRADENITIS, INGUINAL REGION
Anesthesia: General | Site: Groin | Laterality: Bilateral

## 2023-11-16 MED ORDER — DIPHENHYDRAMINE HCL 50 MG/ML IJ SOLN
12.5000 mg | Freq: Once | INTRAMUSCULAR | Status: AC
  Administered 2023-11-16: 12.5 mg via INTRAVENOUS

## 2023-11-16 MED ORDER — DEXMEDETOMIDINE HCL IN NACL 80 MCG/20ML IV SOLN
INTRAVENOUS | Status: DC | PRN
Start: 2023-11-16 — End: 2023-11-16
  Administered 2023-11-16 (×3): 4 ug via INTRAVENOUS

## 2023-11-16 MED ORDER — CHLORHEXIDINE GLUCONATE 0.12 % MT SOLN
15.0000 mL | Freq: Once | OROMUCOSAL | Status: AC
  Administered 2023-11-16: 15 mL via OROMUCOSAL
  Filled 2023-11-16: qty 15

## 2023-11-16 MED ORDER — DEXAMETHASONE SODIUM PHOSPHATE 10 MG/ML IJ SOLN
INTRAMUSCULAR | Status: DC | PRN
  Administered 2023-11-16: 10 mg via INTRAVENOUS

## 2023-11-16 MED ORDER — ONDANSETRON HCL 4 MG/2ML IJ SOLN: 4.0000 mg | Freq: Once | INTRAMUSCULAR | Status: AC

## 2023-11-16 MED ORDER — KETOROLAC TROMETHAMINE 15 MG/ML IJ SOLN
15.0000 mg | Freq: Once | INTRAMUSCULAR | Status: AC
  Administered 2023-11-16: 15 mg via INTRAVENOUS

## 2023-11-16 MED ORDER — DEXMEDETOMIDINE HCL IN NACL 80 MCG/20ML IV SOLN
INTRAVENOUS | Status: AC
Start: 2023-11-16 — End: 2023-11-16
  Filled 2023-11-16: qty 20

## 2023-11-16 MED ORDER — LIDOCAINE 2% (20 MG/ML) 5 ML SYRINGE
INTRAMUSCULAR | Status: AC
  Filled 2023-11-16: qty 5

## 2023-11-16 MED ORDER — ORAL CARE MOUTH RINSE: 15.0000 mL | Freq: Once | OROMUCOSAL | Status: AC

## 2023-11-16 MED ORDER — FENTANYL CITRATE (PF) 100 MCG/2ML IJ SOLN
25.0000 ug | INTRAMUSCULAR | Status: DC | PRN
  Administered 2023-11-16 (×3): 50 ug via INTRAVENOUS

## 2023-11-16 MED ORDER — SCOPOLAMINE 1 MG/3DAYS TD PT72
MEDICATED_PATCH | TRANSDERMAL | Status: AC
  Filled 2023-11-16: qty 1

## 2023-11-16 MED ORDER — FENTANYL CITRATE (PF) 100 MCG/2ML IJ SOLN
INTRAMUSCULAR | Status: AC
  Filled 2023-11-16: qty 2

## 2023-11-16 MED ORDER — FENTANYL CITRATE (PF) 250 MCG/5ML IJ SOLN
INTRAMUSCULAR | Status: DC | PRN
  Administered 2023-11-16 (×2): 50 ug via INTRAVENOUS

## 2023-11-16 MED ORDER — AMISULPRIDE (ANTIEMETIC) 5 MG/2ML IV SOLN
10.0000 mg | Freq: Once | INTRAVENOUS | Status: AC | PRN
Start: 2023-11-16 — End: 2023-11-16
  Administered 2023-11-16: 10 mg via INTRAVENOUS

## 2023-11-16 MED ORDER — SCOPOLAMINE 1 MG/3DAYS TD PT72
1.0000 | MEDICATED_PATCH | TRANSDERMAL | Status: DC
  Administered 2023-11-16: 1.5 mg via TRANSDERMAL

## 2023-11-16 MED ORDER — BUPIVACAINE HCL 0.25 % IJ SOLN
INTRAMUSCULAR | Status: DC | PRN
Start: 2023-11-16 — End: 2023-11-16
  Administered 2023-11-16: 14 mL

## 2023-11-16 MED ORDER — PROPOFOL 10 MG/ML IV BOLUS
INTRAVENOUS | Status: AC
  Filled 2023-11-16: qty 20

## 2023-11-16 MED ORDER — ONDANSETRON HCL 4 MG/2ML IJ SOLN
INTRAMUSCULAR | Status: AC
  Administered 2023-11-16: 4 mg via INTRAVENOUS
  Filled 2023-11-16: qty 2

## 2023-11-16 MED ORDER — DIPHENHYDRAMINE HCL 50 MG/ML IJ SOLN
INTRAMUSCULAR | Status: AC
  Filled 2023-11-16: qty 1

## 2023-11-16 MED ORDER — ACETAMINOPHEN 500 MG PO TABS
1000.0000 mg | ORAL_TABLET | ORAL | Status: AC
  Administered 2023-11-16: 1000 mg via ORAL
  Filled 2023-11-16: qty 2

## 2023-11-16 MED ORDER — 0.9 % SODIUM CHLORIDE (POUR BTL) OPTIME
TOPICAL | Status: DC | PRN
  Administered 2023-11-16: 1000 mL

## 2023-11-16 MED ORDER — PROPOFOL 10 MG/ML IV BOLUS
INTRAVENOUS | Status: DC | PRN
  Administered 2023-11-16: 150 ug/kg/min via INTRAVENOUS
  Administered 2023-11-16: 200 mg via INTRAVENOUS

## 2023-11-16 MED ORDER — KETOROLAC TROMETHAMINE 15 MG/ML IJ SOLN
INTRAMUSCULAR | Status: AC
  Filled 2023-11-16: qty 1

## 2023-11-16 MED ORDER — OXYCODONE HCL 5 MG PO TABS: 5.0000 mg | ORAL_TABLET | Freq: Four times a day (QID) | ORAL | 0 refills | Status: AC | PRN

## 2023-11-16 MED ORDER — ENOXAPARIN SODIUM 40 MG/0.4ML IJ SOSY
40.0000 mg | PREFILLED_SYRINGE | Freq: Once | INTRAMUSCULAR | Status: AC
  Administered 2023-11-16: 40 mg via SUBCUTANEOUS
  Filled 2023-11-16: qty 0.4

## 2023-11-16 MED ORDER — OXYCODONE HCL 5 MG/5ML PO SOLN: 5.0000 mg | Freq: Once | ORAL | Status: AC | PRN

## 2023-11-16 MED ORDER — LIDOCAINE 2% (20 MG/ML) 5 ML SYRINGE
INTRAMUSCULAR | Status: DC | PRN
  Administered 2023-11-16: 60 mg via INTRAVENOUS

## 2023-11-16 MED ORDER — OXYCODONE HCL 5 MG PO TABS
5.0000 mg | ORAL_TABLET | Freq: Once | ORAL | Status: AC | PRN
  Administered 2023-11-16: 5 mg via ORAL

## 2023-11-16 MED ORDER — HYDROMORPHONE HCL 1 MG/ML IJ SOLN
0.2500 mg | INTRAMUSCULAR | Status: DC | PRN
  Administered 2023-11-16: 0.5 mg via INTRAVENOUS

## 2023-11-16 MED ORDER — LACTATED RINGERS IV SOLN: INTRAVENOUS | Status: DC

## 2023-11-16 MED ORDER — MIDAZOLAM HCL 2 MG/2ML IJ SOLN
INTRAMUSCULAR | Status: AC
  Filled 2023-11-16: qty 2

## 2023-11-16 MED ORDER — OXYCODONE HCL 5 MG PO TABS
ORAL_TABLET | ORAL | Status: AC
  Filled 2023-11-16: qty 1

## 2023-11-16 MED ORDER — HYDROMORPHONE HCL 1 MG/ML IJ SOLN
INTRAMUSCULAR | Status: AC
  Filled 2023-11-16: qty 1

## 2023-11-16 MED ORDER — ONDANSETRON HCL 4 MG/2ML IJ SOLN
INTRAMUSCULAR | Status: AC
  Filled 2023-11-16: qty 2

## 2023-11-16 MED ORDER — BUPIVACAINE-EPINEPHRINE (PF) 0.25% -1:200000 IJ SOLN
INTRAMUSCULAR | Status: AC
  Filled 2023-11-16: qty 30

## 2023-11-16 MED ORDER — CEFAZOLIN SODIUM-DEXTROSE 2-4 GM/100ML-% IV SOLN
2.0000 g | INTRAVENOUS | Status: AC
  Administered 2023-11-16: 2 g via INTRAVENOUS
  Filled 2023-11-16: qty 100

## 2023-11-16 MED ORDER — MIDAZOLAM HCL 2 MG/2ML IJ SOLN
INTRAMUSCULAR | Status: DC | PRN
  Administered 2023-11-16: 2 mg via INTRAVENOUS

## 2023-11-16 MED ORDER — AMISULPRIDE (ANTIEMETIC) 5 MG/2ML IV SOLN
INTRAVENOUS | Status: AC
  Filled 2023-11-16: qty 4

## 2023-11-16 MED ORDER — DEXAMETHASONE SODIUM PHOSPHATE 10 MG/ML IJ SOLN
INTRAMUSCULAR | Status: AC
  Filled 2023-11-16: qty 1

## 2023-11-16 MED ORDER — FENTANYL CITRATE (PF) 250 MCG/5ML IJ SOLN
INTRAMUSCULAR | Status: AC
  Filled 2023-11-16: qty 5

## 2023-11-16 SURGICAL SUPPLY — 28 items
CANISTER SUCTION 3000ML PPV (SUCTIONS) ×1 IMPLANT
CHLORAPREP W/TINT 26 (MISCELLANEOUS) ×1 IMPLANT
CLIP APPLIE 9.375 MED OPEN (MISCELLANEOUS) ×1 IMPLANT
CNTNR URN SCR LID CUP LEK RST (MISCELLANEOUS) IMPLANT
COVER SURGICAL LIGHT HANDLE (MISCELLANEOUS) ×1 IMPLANT
DERMABOND ADVANCED .7 DNX12 (GAUZE/BANDAGES/DRESSINGS) IMPLANT
DRAIN CHANNEL 19F RND (DRAIN) IMPLANT
DRAPE LAPAROSCOPIC ABDOMINAL (DRAPES) ×1 IMPLANT
ELECT CAUTERY BLADE 6.4 (BLADE) IMPLANT
ELECTRODE REM PT RTRN 9FT ADLT (ELECTROSURGICAL) ×1 IMPLANT
EVACUATOR SILICONE 100CC (DRAIN) IMPLANT
GAUZE SPONGE 4X4 12PLY STRL (GAUZE/BANDAGES/DRESSINGS) ×1 IMPLANT
GLOVE BIOGEL PI MICRO STRL 6 (GLOVE) ×1 IMPLANT
GLOVE INDICATOR 6.5 STRL GRN (GLOVE) ×1 IMPLANT
GOWN STRL REUS W/ TWL LRG LVL3 (GOWN DISPOSABLE) ×2 IMPLANT
KIT BASIN OR (CUSTOM PROCEDURE TRAY) ×1 IMPLANT
KIT TURNOVER KIT B (KITS) ×1 IMPLANT
NDL HYPO 25GX1X1/2 BEV (NEEDLE) IMPLANT
NEEDLE HYPO 25GX1X1/2 BEV (NEEDLE) ×1 IMPLANT
NS IRRIG 1000ML POUR BTL (IV SOLUTION) ×1 IMPLANT
PACK GENERAL/GYN (CUSTOM PROCEDURE TRAY) ×1 IMPLANT
PAD ARMBOARD POSITIONER FOAM (MISCELLANEOUS) ×2 IMPLANT
STAPLER SKIN PROX 35W (STAPLE) ×1 IMPLANT
SUT ETHILON 2 0 FS 18 (SUTURE) IMPLANT
SUT MNCRL AB 4-0 PS2 18 (SUTURE) IMPLANT
SUT VIC AB 3-0 SH 18 (SUTURE) ×1 IMPLANT
SYR CONTROL 10ML LL (SYRINGE) IMPLANT
TOWEL GREEN STERILE (TOWEL DISPOSABLE) ×1 IMPLANT

## 2023-11-16 NOTE — Anesthesia Preprocedure Evaluation (Signed)
 Anesthesia Evaluation  Patient identified by MRN, date of birth, ID band Patient awake    Reviewed: Allergy & Precautions, NPO status , Patient's Chart, lab work & pertinent test results  History of Anesthesia Complications (+) PONV and history of anesthetic complications  Airway Mallampati: III  TM Distance: >3 FB Neck ROM: Full    Dental  (+) Teeth Intact, Dental Advisory Given Lower braces:   Pulmonary neg pulmonary ROS   Pulmonary exam normal breath sounds clear to auscultation       Cardiovascular hypertension, Pt. on medications Normal cardiovascular exam Rhythm:Regular Rate:Normal  Stress echo 10/2020 for chest pain:  Nuclear stress EF: 46%.  The left ventricular ejection fraction is mildly decreased (45-54%).  T wave inversion was noted during stress in the II, III, aVF, V4, V5, V6, V3 and V2 leads (more prominent than baseline TWI) that resolved in the post-infusion period. May represent episode of vasospasm given patient's history.  Normal myocardial perfusion with no evidence of ischemia or infarction.  This is an intermediate risk study due to mildly reduced LVEF. Recommend TTE for further evaluation.    Echo 11/2020 1. Left ventricular ejection fraction, by estimation, is 60 to 65%. Left  ventricular ejection fraction by 3D volume is 59 %. The left ventricle has  normal function. The left ventricle has no regional wall motion  abnormalities. Left ventricular diastolic   parameters were normal. The average left ventricular global longitudinal  strain is -22.7 %. The global longitudinal strain is normal.   2. Right ventricular systolic function is normal. The right ventricular  size is normal. There is normal pulmonary artery systolic pressure.   3. The mitral valve is normal in structure. No evidence of mitral valve  regurgitation. No evidence of mitral stenosis.   4. The aortic valve is tricuspid. Aortic valve  regurgitation is not  visualized.   5. The inferior vena cava is normal in size with greater than 50%  respiratory variability, suggesting right atrial pressure of 3 mmHg.      Neuro/Psych  Headaches PSYCHIATRIC DISORDERS Anxiety Depression       GI/Hepatic Neg liver ROS,GERD  Controlled and Medicated,,  Endo/Other  negative endocrine ROS    Renal/GU negative Renal ROS  negative genitourinary   Musculoskeletal  (+)  Fibromyalgia -  Abdominal   Peds  Hematology negative hematology ROS (+)   Anesthesia Other Findings   Reproductive/Obstetrics negative OB ROS                              Anesthesia Physical Anesthesia Plan  ASA: 3  Anesthesia Plan: General   Post-op Pain Management: Tylenol  PO (pre-op)*   Induction: Intravenous  PONV Risk Score and Plan: 4 or greater and Ondansetron , Dexamethasone , Midazolam , Scopolamine  patch - Pre-op, Treatment may vary due to age or medical condition, Propofol  infusion and TIVA  Airway Management Planned: LMA  Additional Equipment: None  Intra-op Plan:   Post-operative Plan: Extubation in OR  Informed Consent: I have reviewed the patients History and Physical, chart, labs and discussed the procedure including the risks, benefits and alternatives for the proposed anesthesia with the patient or authorized representative who has indicated his/her understanding and acceptance.     Dental advisory given  Plan Discussed with: CRNA  Anesthesia Plan Comments:          Anesthesia Quick Evaluation

## 2023-11-16 NOTE — Op Note (Signed)
 EXCISION, HIDRADENITIS, INGUINAL REGION  Operative Note (CSN: [id number])  Service  Date of Surgery: 11/16/2023 Admit Date: 11/16/2023 Performing Service: General Surgeons and Role:    DEWAINE Espinoza, Richerd, MD - Primary  Op Note Pre-op Diagnosis: HIDRADENITIS Post-op Diagnosis: Post-Op Diagnosis Codes:    * Hidradenitis [L73.2]  Procedure(s): EXCISION, HIDRADENITIS, INGUINAL REGION  Findings: Chronic sinus tracts in bilateral groins, no active infection.  Anesthesia: General Estimated Blood Loss: Minimal  Complications: None Specimens: Left superior hidradenitis, left inferior hidradenitis, right hidradenitis.   Procedure Details  Prior to the procedure, the risks, benefits, complications, treatment options, and expected outcomes were discussed with the patient and/or family, including but not limited to, the risks of bleeding, infection, post-op seroma, post-op hematoma, wound dehiscence, and recurrence. Despite the risks, the patient has given informed consent for operative intervention.  The patient was taken to the Operating Room, identified as Gina Espinoza and the procedure verified as excision of bilateral groin hidradenitis. Identification pause was held and the above information confirmed.  The patient was placed in the supine position and General LMA anesthesia was induced. The patient was then positioned in frog leg position. The bilateral groin was prepped with chloraprep and draped in the typical sterile fashion.  A formal preincision time out was performed.  Starting on the right, local anesthetic with 0.25% marcaine  with epi was injected into the skin overlying area of hidradenitis..  An elliptical incision was made incorporating area of hidradenitis. Electrocautery was used to excise any areas of chronic inflammation and sinus tracts. The specimen was then removed and sent for pathology. Electrocautery used to extend skin flaps to aid in closure in order to decrease  tension. Wound bed was irrigated and hemostasis confirmed. Wound closed in layers with deep dermals using 3-0 vicryl.  Attention then turned to left side. Again, local anesthetic injected overlying area of concern. An elliptical incision was made over hidradenitis. Electrocautery used to excise any areas of chronic inflammation and sinus tracts. Specimen removed and sent to pathology. Wound bed was irrigated and hemostasis confirmed. Wound closed in layers with deep dermals using 3-0 vicryl. There was a lower 1x0.5cm area of concern for hidradenitis that was several centimeters away from first left sided excision site making decision to have 2 separate incisions on this side. As other sites, this area was injected with anesthetic, elliptical incision made and electrocautery used to excise chronic inflammation and sinus tracts. Wound closed in layers with deep dermals using 3-0 vicryl.  Skin for all incisions closed with 4-0 monocryl running subcuticular stitch. Dermabond placed over incisions bilaterally.  Instrument, sponge, and needle counts were correct at the conclusion of the case.   Gina Silversmith, MD [facility] Surgery Date: 11/16/2023  Time: 12:51 PM

## 2023-11-16 NOTE — Interval H&P Note (Signed)
 History and Physical Interval Note:  11/16/2023 10:36 AM  Gina Espinoza  has presented today for surgery, with the diagnosis of HIDRADENITIS.  The various methods of treatment have been discussed with the patient and family. After consideration of risks, benefits and other options for treatment, the patient has consented to  Procedure(s) with comments: EXCISION, HIDRADENITIS, INGUINAL REGION (Bilateral) - LMA as a surgical intervention.  The patient's history has been reviewed, patient examined, no change in status, stable for surgery.  I have reviewed the patient's chart and labs.  Questions were answered to the patient's satisfaction.     Richerd Silversmith

## 2023-11-16 NOTE — Anesthesia Procedure Notes (Signed)
 Procedure Name: LMA Insertion Date/Time: 11/16/2023 11:54 AM  Performed by: Anastazia Creek A, CRNAPre-anesthesia Checklist: Patient identified, Emergency Drugs available, Suction available and Patient being monitored Patient Re-evaluated:Patient Re-evaluated prior to induction Oxygen Delivery Method: Circle System Utilized Preoxygenation: Pre-oxygenation with 100% oxygen Induction Type: IV induction Ventilation: Mask ventilation without difficulty LMA: LMA inserted LMA Size: 4.0 Number of attempts: 1 Airway Equipment and Method: Bite block Placement Confirmation: positive ETCO2 Tube secured with: Tape Dental Injury: Teeth and Oropharynx as per pre-operative assessment

## 2023-11-16 NOTE — Transfer of Care (Signed)
 Immediate Anesthesia Transfer of Care Note  Patient: Gina Espinoza  Procedure(s) Performed: EXCISION, HIDRADENITIS, INGUINAL REGION (Bilateral: Groin)  Patient Location: PACU  Anesthesia Type:General  Level of Consciousness: awake, alert , and oriented  Airway & Oxygen Therapy: Patient Spontanous Breathing and Patient connected to nasal cannula oxygen  Post-op Assessment: Report given to RN and Post -op Vital signs reviewed and stable  Post vital signs: Reviewed and stable  Last Vitals:  Vitals Value Taken Time  BP 120/76 11/16/23 13:00  Temp    Pulse 62 11/16/23 13:02  Resp 13 11/16/23 13:02  SpO2 99 % 11/16/23 13:02  Vitals shown include unfiled device data.  Last Pain:  Vitals:   11/16/23 1055  TempSrc:   PainSc: 4       Patients Stated Pain Goal: 4 (11/16/23 1055)  Complications: No notable events documented.

## 2023-11-19 ENCOUNTER — Encounter (HOSPITAL_COMMUNITY): Payer: Self-pay | Admitting: General Surgery

## 2023-11-19 NOTE — Anesthesia Postprocedure Evaluation (Signed)
 Anesthesia Post Note  Patient: Gina Espinoza  Procedure(s) Performed: EXCISION, HIDRADENITIS, INGUINAL REGION (Bilateral: Groin)     Patient location during evaluation: PACU Anesthesia Type: General Level of consciousness: awake and alert Pain management: pain level controlled Vital Signs Assessment: post-procedure vital signs reviewed and stable Respiratory status: spontaneous breathing, nonlabored ventilation, respiratory function stable and patient connected to nasal cannula oxygen Cardiovascular status: blood pressure returned to baseline and stable Postop Assessment: no apparent nausea or vomiting Anesthetic complications: no   No notable events documented.  Last Vitals:  Vitals:   11/16/23 1600 11/16/23 1615  BP: 127/79 120/77  Pulse: (!) 55 (!) 56  Resp: 10 11  Temp:  36.5 C  SpO2: 96% 96%    Last Pain:  Vitals:   11/16/23 1430  TempSrc:   PainSc: 9                  Epifanio Lamar BRAVO

## 2023-11-20 LAB — SURGICAL PATHOLOGY

## 2023-12-06 ENCOUNTER — Telehealth: Payer: Self-pay

## 2023-12-06 DIAGNOSIS — G894 Chronic pain syndrome: Secondary | ICD-10-CM

## 2023-12-06 NOTE — Telephone Encounter (Signed)
 Copied from CRM [id number]. Topic: Clinical - Medication Question >> Dec 06, 2023 10:09 AM Marissa P wrote: Reason for CRM: Patient called stating that the meds that was sent in at the last appt works very good for her and she would like a refill please

## 2023-12-08 MED ORDER — GABAPENTIN 300 MG PO CAPS: 300.0000 mg | ORAL_CAPSULE | Freq: Two times a day (BID) | ORAL | 1 refills | Status: AC

## 2023-12-08 NOTE — Addendum Note (Signed)
 Addended by: JOYCE RUSH C on: 12/08/2023 12:00 PM   Modules accepted: Orders

## 2024-01-10 ENCOUNTER — Other Ambulatory Visit: Payer: Self-pay | Admitting: Family Medicine

## 2024-01-10 DIAGNOSIS — K219 Gastro-esophageal reflux disease without esophagitis: Secondary | ICD-10-CM

## 2024-02-27 DIAGNOSIS — Z1231 Encounter for screening mammogram for malignant neoplasm of breast: Secondary | ICD-10-CM | POA: Diagnosis not present

## 2024-04-21 ENCOUNTER — Encounter: Payer: Self-pay | Admitting: Physician Assistant

## 2024-04-21 ENCOUNTER — Ambulatory Visit: Admitting: Physician Assistant

## 2024-04-21 VITALS — BP 126/82

## 2024-04-21 DIAGNOSIS — L304 Erythema intertrigo: Secondary | ICD-10-CM

## 2024-04-21 DIAGNOSIS — L732 Hidradenitis suppurativa: Secondary | ICD-10-CM | POA: Diagnosis not present

## 2024-04-21 MED ORDER — NYSTATIN-TRIAMCINOLONE 100000-0.1 UNIT/GM-% EX OINT: 1.0000 | TOPICAL_OINTMENT | Freq: Two times a day (BID) | CUTANEOUS | 1 refills | Status: AC

## 2024-04-21 NOTE — Progress Notes (Signed)
   New Patient Visit   Subjective  Gina Espinoza is a 63 y.o. female NEW PATIENT who presents for the following: Hidradenitis of axilla and groin. She has had surgery of the groin area and the right axilla. She has bumps broken out under her left arm. She has been on doxycycline in the past. She washes with Dove for sensitive skin  She also has yeast under her breast. She was given nystatin  powder and cream but neither one helps.    The following portions of the chart were reviewed this encounter and updated as appropriate: medications, allergies, medical history  Review of Systems:  No other skin or systemic complaints except as noted in HPI or Assessment and Plan.  Objective  Well appearing patient in no apparent distress; mood and affect are within normal limits.  A focused examination was performed of the following areas:  axillae, infra-panus   Relevant exam findings are noted in the Assessment and Plan.    Assessment & Plan   ERYTHEMA INTERTRIGO   HIDRADENITIS SUPPURATIVA    HIDRADENITIS SUPPURATIVA Exam: scars and resolving papules    Hidradenitis Suppurativa is a chronic; persistent; non-curable, but treatable condition due to abnormal inflamed sweat glands in the body folds (axilla, inframammary, groin, medial thighs), causing recurrent painful draining cysts and scarring. It can be associated with severe scarring acne and cysts; also abscesses and scarring of scalp. The goal is control and prevention of flares, as it is not curable. Scars are permanent and can be thickened. Treatment may include daily use of topical medication and oral antibiotics.  Oral isotretinoin may also be helpful.  For some cases, Humira or Cosentyx (biologic injections) may be prescribed to decrease the inflammatory process and prevent flares.  When indicated, inflamed cysts may also be treated surgically.  Treatment Plan: Recommend alternating Dial antibacterial soap, Hibiclens  and Panoxyl  10 cleanser   INTERTRIGO Exam: Minimal erythema    Intertrigo is a chronic recurrent rash that occurs in skin fold areas that may be associated with friction; heat; moisture; yeast; fungus; and bacteria.  It is exacerbated by increased movement / activity; sweating; and higher atmospheric temperature.  Use of an absorbant powder such as Zeasorb AF powder or other OTC antifungal powder to the area daily can prevent rash recurrence. Other options to help keep the area dry include blow drying the area after bathing or using antiperspirant products such as Duradry sweat minimizing gel.  Treatment Plan: Nystatin -TMC 0.1% ointment Apply to affected areas twice daily when flared.  Recommend washing area and drying with a hair dryer before bed. Then sleeping with a cotton towel or diaper under breasts at night.    Return in about 5 months (around 09/19/2024) for Hidradenitis follow up.  I, Roseline Hutchinson, CMA, am acting as scribe for Jaquavion Mccannon K, PA-C .   Documentation: I have reviewed the above documentation for accuracy and completeness, and I agree with the above.  Jiya Kissinger K, PA-C

## 2024-04-21 NOTE — Patient Instructions (Addendum)

## 2024-05-29 ENCOUNTER — Encounter: Payer: Self-pay | Admitting: Cardiology

## 2024-09-23 ENCOUNTER — Ambulatory Visit: Admitting: Physician Assistant

## 2024-10-30 ENCOUNTER — Ambulatory Visit: Payer: Self-pay | Admitting: Family Medicine
